# Patient Record
Sex: Female | Born: 1975 | Race: White | Hispanic: No | Marital: Married | Smoking: Current every day smoker
Health system: Southern US, Community
[De-identification: ages and names within clinical notes are randomized; demographics above are authoritative.]

## PROBLEM LIST (undated history)

## (undated) DIAGNOSIS — F32A Depression, unspecified: Secondary | ICD-10-CM

## (undated) HISTORY — DX: Depression, unspecified: F32.A

---

## 1998-06-12 HISTORY — PX: CHOLECYSTECTOMY: SHX55

## 2017-09-10 DIAGNOSIS — F418 Other specified anxiety disorders: Secondary | ICD-10-CM | POA: Insufficient documentation

## 2019-05-23 ENCOUNTER — Telehealth: Payer: Self-pay

## 2019-05-23 ENCOUNTER — Ambulatory Visit: Payer: Self-pay

## 2019-05-23 NOTE — Telephone Encounter (Signed)
Called and spoke to patients husband first and he continued to speak of his wife symptoms. I got Cristina Finley on the line.  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  She is experiecning cough, diarrhea, muscle aches, taste is off, and she has fever. She  Symptoms started last week with cough. She states she produces phelgm.  She has fever of 101.  She has had 4-5 watery stools today. She feels SOB with activity.Care advice read to patient. She verbalized understanding. She will go to [facility] evaluation of symptoms and call for establish care visit Monday She will go to [facility] tomorrow for COVID-19 testing. She verbalized understanding of all information  Reason for Disposition . [1] COVID-19 infection suspected by caller or triager AND [2] mild symptoms (cough, fever, or others) AND [7] no complications or SOB  Answer Assessment - Initial Assessment Questions 1. COVID-19 DIAGNOSIS: "Who made your Coronavirus (COVID-19) diagnosis?" "Was it confirmed by a positive lab test?" If not diagnosed by a HCP, ask "Are there lots of cases (community spread) where you live?" (See public health department website, if unsure)       2. COVID-19 EXPOSURE: "Was there any known exposure to COVID before the symptoms began?" CDC Definition of close contact: within 6 feet (2 meters) for a total of 15 minutes or more over a 24-hour period.      Last week with cough 3. ONSET: "When did the COVID-19 symptoms start?"     Last week 4. WORST SYMPTOM: "What is your worst symptom?" (e.g., cough, fever, shortness of breath, muscle aches)    Diarrhea cough fever 5. COUGH: "Do you have a cough?" If so, ask: "How bad is the cough?"      frequent 6. FEVER: "Do you have a fever?" If so, ask: "What is your temperature, how was it measured, and when did it start?"     101 7. RESPIRATORY STATUS: "Describe your breathing?" (e.g., shortness of breath, wheezing, unable to speak)     SOB  with activity 8. BETTER-SAME-WORSE: "Are you getting better, staying the same or getting worse compared to yesterday?"  If getting worse, ask, "In what way?"   same 9. HIGH RISK DISEASE: "Do you have any chronic medical problems?" (e.g., asthma, heart or lung disease, weak immune system, obesity, etc.)    smoker 10. PREGNANCY: "Is there any chance you are pregnant?" "When was your last menstrual period?"      No 2 weeks ago 11. OTHER SYMPTOMS: "Do you have any other symptoms?"  (e.g., chills, fatigue, headache, loss of smell or taste, muscle pain, sore throat; new loss of smell or taste especially support the diagnosis of COVID-19)      Tired headache,taste is off,muscle pain Sore throat diarrhea vomited once phlegm  Protocols used: CORONAVIRUS (COVID-19) DIAGNOSED OR SUSPECTED-A-AH

## 2019-05-23 NOTE — Telephone Encounter (Signed)
Open in error see triage encounter

## 2019-07-18 ENCOUNTER — Ambulatory Visit: Payer: Self-pay | Admitting: Family Medicine

## 2020-08-27 ENCOUNTER — Other Ambulatory Visit: Payer: Self-pay

## 2020-08-27 ENCOUNTER — Encounter: Payer: Self-pay | Admitting: Physician Assistant

## 2020-08-27 ENCOUNTER — Ambulatory Visit: Payer: Self-pay | Admitting: Physician Assistant

## 2020-08-27 DIAGNOSIS — Z113 Encounter for screening for infections with a predominantly sexual mode of transmission: Secondary | ICD-10-CM

## 2020-08-27 DIAGNOSIS — F418 Other specified anxiety disorders: Secondary | ICD-10-CM

## 2020-08-27 DIAGNOSIS — N760 Acute vaginitis: Secondary | ICD-10-CM

## 2020-08-27 DIAGNOSIS — B9689 Other specified bacterial agents as the cause of diseases classified elsewhere: Secondary | ICD-10-CM

## 2020-08-27 LAB — WET PREP FOR TRICH, YEAST, CLUE
Trichomonas Exam: NEGATIVE
Yeast Exam: NEGATIVE

## 2020-08-27 MED ORDER — METRONIDAZOLE 500 MG PO TABS: 500.0000 mg | ORAL_TABLET | Freq: Two times a day (BID) | ORAL | 0 refills | Status: DC

## 2020-08-27 MED ORDER — METRONIDAZOLE 500 MG PO TABS
500.0000 mg | ORAL_TABLET | Freq: Two times a day (BID) | ORAL | 0 refills | Status: AC
Start: 2020-08-27 — End: 2020-09-03

## 2020-08-27 NOTE — Progress Notes (Signed)
[facility] Department STI clinic/screening visit  Subjective:  Cristina Finley is a 45 y.o. female being seen today for an STI screening visit. The patient reports they do have symptoms.  Patient reports that they do not desire a pregnancy in the next year.   They reported they are not interested in discussing contraception today.  No LMP recorded.   Patient has the following medical conditions:   Patient Active Problem List   Diagnosis Date Noted  . Depression with anxiety 09/10/2017    Chief Complaint  Patient presents with  . STD screen    HPI  Patient reports that she has found out that her partner of 20+ years has been meeting other people online and she is concerned that he may have met someone in person so she would like a screening today.  States that for about 2 weeks she has noticed an increase in discharge with odor.  Denies other symptoms.  Reports that she has not had a HIV test in the past and last pap was in 2020.  LMP was 08/14/2020 and has had BTL for birth control.   See flowsheet for further details and programmatic requirements.    The following portions of the patient's history were reviewed and updated as appropriate: allergies, current medications, past medical history, past social history, past surgical history and problem list.  Objective:  There were no vitals filed for this visit.  Physical Exam Constitutional:      General: She is not in acute distress.    Appearance: Normal appearance.  HENT:     Head: Normocephalic and atraumatic.     Comments: No nits,lice, or hair loss. No cervical, supraclavicular or axillary adenopathy.    Mouth/Throat:     Mouth: Mucous membranes are moist.     Pharynx: Oropharynx is clear. No oropharyngeal exudate or posterior oropharyngeal erythema.  Eyes:     Conjunctiva/sclera: Conjunctivae normal.  Pulmonary:     Effort: Pulmonary effort is normal.  Abdominal:     Palpations: Abdomen is soft. There is  no mass.     Tenderness: There is no abdominal tenderness. There is no guarding or rebound.  Genitourinary:    General: Normal vulva.     Rectum: Normal.     Comments: External genitalia/pubic area without nits, lice, edema, erythema, lesions and inguinal adenopathy. Vagina with normal mucosa and moderate amount of thick, white discharge, pH=>4.5. Cervix without visible lesions. Uterus firm, mobile, nt, no masses, no CMT, no adnexal tenderness or fullness. Musculoskeletal:     Cervical back: Neck supple. No tenderness.  Skin:    General: Skin is warm and dry.     Findings: No bruising, erythema, lesion or rash.  Neurological:     Mental Status: She is alert and oriented to person, place, and time.  Psychiatric:        Mood and Affect: Mood normal.        Behavior: Behavior normal.        Thought Content: Thought content normal.        Judgment: Judgment normal.      Assessment and Plan:  Cristina Finley is a 45 y.o. female presenting to the [facility] Department for STI screening  1. Screening for venereal disease Patient into clinic with symptoms. Rec condoms with all sex. Await test results.  Counseled that RN will call if needs to RTC for treatment once results are back. - WET PREP FOR [place] [place]  [facility] - HIV/HCV [facility] - Syphilis Serology, [facility]  2. BV (bacterial vaginosis) Will treat with Metronidazole 500 mg #14 1 po BID for 7 days with food, no EtOH for 24 hr before and until 72 hr after completing medicine. No sex for 10 days. Enc to use OTC antifungal cream if has itching during or just after treatment with antibiotic. - metroNIDAZOLE (FLAGYL) 500 MG tablet; Take 1 tablet (500 mg total) by mouth 2 (two) times daily for 7 days.  Dispense: 14 tablet; Refill: 0  3. Depression with anxiety Patient with h/o anxiety and depression. Declines referral to LCSW today but does accept card to use if changes her  mind. Patient also given info on [employer] and [facility].     No follow-ups on file.  No future appointments.  Jerene Dilling, PA

## 2020-08-27 NOTE — Progress Notes (Signed)
Wet mount reviewed.  MTZ given per C. [place] PA VO Richmond Campbell, RN

## 2020-08-31 ENCOUNTER — Encounter: Payer: Self-pay | Admitting: Physician Assistant

## 2020-08-31 ENCOUNTER — Telehealth: Payer: Self-pay

## 2020-08-31 NOTE — Progress Notes (Signed)
Per Resa Miner, RN the [facility] called about patient's samples but the message she got was not specific as to what needs to be resent.  RN to call [facility] and find out what they need.  Per note left on message, patient needs to have HIV and Syphilis resent.  Per [employer], patient's blood hemolyzed and was sent, but likely that is why the [facility] is not able to complete the testing.  Patient will be called and rescheduled to have her blood redrawn for the testing.

## 2020-09-01 ENCOUNTER — Other Ambulatory Visit: Payer: BC Managed Care – PPO

## 2020-09-01 ENCOUNTER — Other Ambulatory Visit: Payer: Self-pay

## 2020-09-01 DIAGNOSIS — Z113 Encounter for screening for infections with a predominantly sexual mode of transmission: Secondary | ICD-10-CM

## 2020-09-01 NOTE — Progress Notes (Signed)
Patient in lab today to have HIV/HCV and RPR redrawn; sample from last week hemolyzed.Richmond Campbell, RN

## 2020-09-07 ENCOUNTER — Encounter: Payer: Self-pay | Admitting: Physician Assistant

## 2020-09-07 LAB — HM HEPATITIS C SCREENING LAB: HM Hepatitis Screen: NEGATIVE

## 2020-09-08 NOTE — Telephone Encounter (Signed)
Patient scheduled for re-draw of HIV/HCV/RPR Cristina Campbell, RN

## 2020-09-09 ENCOUNTER — Other Ambulatory Visit: Payer: Self-pay | Admitting: Obstetrics and Gynecology

## 2020-09-09 DIAGNOSIS — Z1231 Encounter for screening mammogram for malignant neoplasm of breast: Secondary | ICD-10-CM

## 2020-09-09 LAB — HM PAP SMEAR

## 2020-10-18 ENCOUNTER — Emergency Department (EMERGENCY_DEPARTMENT_HOSPITAL)
Admission: EM | Admit: 2020-10-18 | Discharge: 2020-10-19 | Disposition: A | Discharge status: Other Acute Inpatient Hospital | Payer: BC Managed Care – PPO | Source: Home / Self Care | Attending: Emergency Medicine | Admitting: Emergency Medicine

## 2020-10-18 ENCOUNTER — Encounter: Payer: Self-pay | Admitting: *Deleted

## 2020-10-18 ENCOUNTER — Other Ambulatory Visit: Payer: Self-pay

## 2020-10-18 DIAGNOSIS — Z720 Tobacco use: Secondary | ICD-10-CM

## 2020-10-18 DIAGNOSIS — F333 Major depressive disorder, recurrent, severe with psychotic symptoms: Secondary | ICD-10-CM | POA: Insufficient documentation

## 2020-10-18 DIAGNOSIS — R45851 Suicidal ideations: Secondary | ICD-10-CM | POA: Insufficient documentation

## 2020-10-18 DIAGNOSIS — R440 Auditory hallucinations: Secondary | ICD-10-CM | POA: Insufficient documentation

## 2020-10-18 DIAGNOSIS — F1010 Alcohol abuse, uncomplicated: Secondary | ICD-10-CM

## 2020-10-18 DIAGNOSIS — F323 Major depressive disorder, single episode, severe with psychotic features: Secondary | ICD-10-CM

## 2020-10-18 DIAGNOSIS — F17210 Nicotine dependence, cigarettes, uncomplicated: Secondary | ICD-10-CM | POA: Insufficient documentation

## 2020-10-18 DIAGNOSIS — F332 Major depressive disorder, recurrent severe without psychotic features: Secondary | ICD-10-CM

## 2020-10-18 DIAGNOSIS — Z20822 Contact with and (suspected) exposure to covid-19: Secondary | ICD-10-CM | POA: Insufficient documentation

## 2020-10-18 LAB — CBC
HCT: 38.6 % (ref 36.0–46.0)
Hemoglobin: 12.8 g/dL (ref 12.0–15.0)
MCH: 30.5 pg (ref 26.0–34.0)
MCHC: 33.2 g/dL (ref 30.0–36.0)
MCV: 91.9 fL (ref 80.0–100.0)
Platelets: 286 10*3/uL (ref 150–400)
RBC: 4.2 MIL/uL (ref 3.87–5.11)
RDW: 14 % (ref 11.5–15.5)
WBC: 10.1 10*3/uL (ref 4.0–10.5)
nRBC: 0 % (ref 0.0–0.2)

## 2020-10-18 LAB — URINE DRUG SCREEN, QUALITATIVE (ARMC ONLY)
Amphetamines, Ur Screen: NOT DETECTED
Barbiturates, Ur Screen: NOT DETECTED
Benzodiazepine, Ur Scrn: NOT DETECTED
Cannabinoid 50 Ng, Ur ~~LOC~~: NOT DETECTED
Cocaine Metabolite,Ur ~~LOC~~: NOT DETECTED
MDMA (Ecstasy)Ur Screen: NOT DETECTED
Methadone Scn, Ur: NOT DETECTED
Opiate, Ur Screen: NOT DETECTED
Phencyclidine (PCP) Ur S: NOT DETECTED
Tricyclic, Ur Screen: NOT DETECTED

## 2020-10-18 LAB — COMPREHENSIVE METABOLIC PANEL
ALT: 14 U/L (ref 0–44)
AST: 16 U/L (ref 15–41)
Albumin: 4.1 g/dL (ref 3.5–5.0)
Alkaline Phosphatase: 53 U/L (ref 38–126)
Anion gap: 11 (ref 5–15)
BUN: 9 mg/dL (ref 6–20)
CO2: 24 mmol/L (ref 22–32)
Calcium: 9.2 mg/dL (ref 8.9–10.3)
Chloride: 102 mmol/L (ref 98–111)
Creatinine, Ser: 0.75 mg/dL (ref 0.44–1.00)
GFR, Estimated: >60 mL/min (ref >60)
Glucose, Bld: 97 mg/dL (ref 70–99)
Potassium: 3.6 mmol/L (ref 3.5–5.1)
Sodium: 137 mmol/L (ref 135–145)
Total Bilirubin: 0.5 mg/dL (ref 0.3–1.2)
Total Protein: 7.4 g/dL (ref 6.5–8.1)

## 2020-10-18 LAB — RESP PANEL BY RT-PCR (FLU A&B, COVID) ARPGX2
Influenza A by PCR: NEGATIVE
Influenza B by PCR: NEGATIVE
SARS Coronavirus 2 by RT PCR: NEGATIVE

## 2020-10-18 LAB — SALICYLATE LEVEL: Salicylate Lvl: <7 mg/dL — ABNORMAL LOW (ref 7.0–30.0)

## 2020-10-18 LAB — PREGNANCY, URINE: Preg Test, Ur: NEGATIVE

## 2020-10-18 LAB — ACETAMINOPHEN LEVEL: Acetaminophen (Tylenol), Serum: <10 ug/mL — ABNORMAL LOW (ref 10–30)

## 2020-10-18 LAB — ETHANOL: Alcohol, Ethyl (B): <10 mg/dL (ref <10)

## 2020-10-18 MED ORDER — LORAZEPAM 2 MG PO TABS
2.0000 mg | ORAL_TABLET | Freq: Once | ORAL | Status: AC
  Administered 2020-10-18: 2 mg via ORAL
  Filled 2020-10-18: qty 1

## 2020-10-18 NOTE — ED Notes (Signed)
Pt continues to be timid and quiet. Pt when talking to nurse states that she needs help with her suicidal thoughts and her other issues but then does not say anything to nurse again about this despite attempting to have conversation with pt.

## 2020-10-18 NOTE — ED Notes (Signed)
This nurse communicated with Melody, Secretary who calls in to request SOC consults that per Dr. Stafford, due to 12-24 hour turnaround time for consult completion, no SOC consult will be ordered and we will wait for psych in AM. 

## 2020-10-18 NOTE — ED Notes (Signed)
Hourly rounding performed, patient currently awake in room. Patient has no complaints at this time. Q15 minute rounds and monitoring via Security Cameras to continue. 

## 2020-10-18 NOTE — ED Notes (Signed)
Black leggings White bra Sandals Gray shirt 3 bobby pins 2 yellow color rings

## 2020-10-18 NOTE — ED Notes (Signed)
tts states pt found in room crying and anxious before exam. This nurse attempts so call Dr. Scotty Court, no answer. Secure chat sent.

## 2020-10-18 NOTE — ED Notes (Signed)
Hourly rounding performed, patient currently asleep in room. Patient has no complaints at this time. Q15 minute rounds and monitoring via Security Cameras to continue. 

## 2020-10-18 NOTE — ED Triage Notes (Addendum)
Pt reports feeling SI.  Denies HI. Hx of drugs and etoh.   Pt reports no reason to live, husband left for another woman per pt.  Pt calm and cooperative.

## 2020-10-18 NOTE — ED Notes (Signed)
Patient transferred from ED to [facility] room 5 after screening for contraband. Report received from [place], RN including Situation, Background, Assessment and Recommendations. Pt oriented to unit including Q15 minute rounds as well as the security cameras for their protection. Patient is alert and oriented, warm and dry in no acute distress. Patient denies HI and AVH. Pt. Encouraged to let this nurse know if needs arise.

## 2020-10-19 ENCOUNTER — Other Ambulatory Visit: Payer: Self-pay

## 2020-10-19 ENCOUNTER — Inpatient Hospital Stay
Admission: RE | Admit: 2020-10-19 | Discharge: 2020-11-15 | DRG: 885 | Disposition: A | Discharge status: Home / Self Care | Payer: BC Managed Care – PPO | Source: Intra-hospital | Attending: Behavioral Health | Admitting: Behavioral Health

## 2020-10-19 ENCOUNTER — Encounter: Payer: Self-pay | Admitting: Psychiatry

## 2020-10-19 DIAGNOSIS — Z01812 Encounter for preprocedural laboratory examination: Secondary | ICD-10-CM | POA: Diagnosis present

## 2020-10-19 DIAGNOSIS — F17210 Nicotine dependence, cigarettes, uncomplicated: Secondary | ICD-10-CM | POA: Diagnosis present

## 2020-10-19 DIAGNOSIS — Z880 Allergy status to penicillin: Secondary | ICD-10-CM | POA: Diagnosis not present

## 2020-10-19 DIAGNOSIS — F1010 Alcohol abuse, uncomplicated: Secondary | ICD-10-CM | POA: Diagnosis present

## 2020-10-19 DIAGNOSIS — Z720 Tobacco use: Secondary | ICD-10-CM | POA: Diagnosis present

## 2020-10-19 DIAGNOSIS — F332 Major depressive disorder, recurrent severe without psychotic features: Secondary | ICD-10-CM | POA: Diagnosis present

## 2020-10-19 DIAGNOSIS — Z818 Family history of other mental and behavioral disorders: Secondary | ICD-10-CM

## 2020-10-19 DIAGNOSIS — G4700 Insomnia, unspecified: Secondary | ICD-10-CM | POA: Diagnosis present

## 2020-10-19 DIAGNOSIS — Z20822 Contact with and (suspected) exposure to covid-19: Secondary | ICD-10-CM | POA: Diagnosis present

## 2020-10-19 DIAGNOSIS — R636 Underweight: Secondary | ICD-10-CM | POA: Diagnosis present

## 2020-10-19 DIAGNOSIS — F323 Major depressive disorder, single episode, severe with psychotic features: Secondary | ICD-10-CM | POA: Diagnosis present

## 2020-10-19 DIAGNOSIS — F1290 Cannabis use, unspecified, uncomplicated: Secondary | ICD-10-CM | POA: Diagnosis not present

## 2020-10-19 DIAGNOSIS — R569 Unspecified convulsions: Secondary | ICD-10-CM | POA: Diagnosis not present

## 2020-10-19 DIAGNOSIS — F333 Major depressive disorder, recurrent, severe with psychotic symptoms: Secondary | ICD-10-CM | POA: Diagnosis present

## 2020-10-19 DIAGNOSIS — Z5181 Encounter for therapeutic drug level monitoring: Secondary | ICD-10-CM | POA: Diagnosis not present

## 2020-10-19 DIAGNOSIS — R45851 Suicidal ideations: Secondary | ICD-10-CM | POA: Diagnosis present

## 2020-10-19 MED ORDER — TRAZODONE HCL 50 MG PO TABS
50.0000 mg | ORAL_TABLET | Freq: Every evening | ORAL | Status: DC | PRN
  Administered 2020-10-19 – 2020-11-14 (×11): 50 mg via ORAL
  Filled 2020-10-19 (×11): qty 1

## 2020-10-19 MED ORDER — ARIPIPRAZOLE 5 MG PO TABS
5.0000 mg | ORAL_TABLET | Freq: Every day | ORAL | Status: DC
  Administered 2020-10-19 – 2020-10-21 (×3): 5 mg via ORAL
  Filled 2020-10-19 (×3): qty 1

## 2020-10-19 MED ORDER — MAGNESIUM HYDROXIDE 400 MG/5ML PO SUSP: 30.0000 mL | Freq: Every day | ORAL | Status: DC | PRN

## 2020-10-19 MED ORDER — HYDROXYZINE HCL 25 MG PO TABS
25.0000 mg | ORAL_TABLET | Freq: Three times a day (TID) | ORAL | Status: DC | PRN
  Administered 2020-10-19 – 2020-11-14 (×6): 25 mg via ORAL
  Filled 2020-10-19 (×6): qty 1

## 2020-10-19 MED ORDER — ALUM & MAG HYDROXIDE-SIMETH 200-200-20 MG/5ML PO SUSP: 30.0000 mL | ORAL | Status: DC | PRN

## 2020-10-19 MED ORDER — NICOTINE 21 MG/24HR TD PT24
21.0000 mg | MEDICATED_PATCH | Freq: Every day | TRANSDERMAL | Status: DC
  Filled 2020-10-19: qty 1

## 2020-10-19 MED ORDER — CITALOPRAM HYDROBROMIDE 20 MG PO TABS
20.0000 mg | ORAL_TABLET | Freq: Every day | ORAL | Status: DC
  Administered 2020-10-19 – 2020-11-01 (×13): 20 mg via ORAL
  Filled 2020-10-19 (×14): qty 1

## 2020-10-19 MED ORDER — PNEUMOCOCCAL VAC POLYVALENT 25 MCG/0.5ML IJ INJ
0.5000 mL | INJECTION | INTRAMUSCULAR | Status: DC
  Filled 2020-10-19: qty 0.5

## 2020-10-19 MED ORDER — NICOTINE 21 MG/24HR TD PT24
21.0000 mg | MEDICATED_PATCH | Freq: Every day | TRANSDERMAL | Status: DC
  Administered 2020-10-20 – 2020-11-14 (×15): 21 mg via TRANSDERMAL
  Filled 2020-10-19 (×21): qty 1

## 2020-10-19 MED ORDER — ACETAMINOPHEN 325 MG PO TABS: 650.0000 mg | ORAL_TABLET | Freq: Four times a day (QID) | ORAL | Status: DC | PRN

## 2020-10-19 NOTE — ED Notes (Signed)
Hourly rounding performed, patient currently asleep in room. Patient has no complaints at this time. Q15 minute rounds and monitoring via Security Cameras to continue. 

## 2020-10-19 NOTE — ED Notes (Signed)
Pt asleep at this time, unable to collect vitals. Will collect pt vitals once awake. 

## 2020-10-19 NOTE — ED Notes (Signed)
IVC/Psych Consult Pending
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
## 2020-10-19 NOTE — Consult Note (Signed)
[facility] Face-to-Face Psychiatry Consult   Reason for Consult: Consult for 45 year old woman who came to the emergency room voluntarily but now was placed under IVC for depression Referring Physician:  Vicente Males Patient Identification: Cristina Finley MRN:  [id number] Principal Diagnosis: Severe recurrent major depression without psychotic features (HCC) Diagnosis:  Principal Problem:   Severe recurrent major depression without psychotic features (HCC) Active Problems:   Alcohol abuse   Nicotine abuse   Total Time spent with patient: 1 hour  Subjective:   Cristina Finley is a 45 y.o. female patient admitted with "I have been so depressed".  HPI: Patient seen chart reviewed.  45 year old woman came to the emergency room voluntarily last night but later was placed under IVC because of concern about suicidal ideation.  Patient reports that she is feeling extremely depressed.  She has felt terrible for at least a month and a half.  Says that she gets confused and feels like she is losing time.  Seems to be dissociating at time because she is so emotionally overwhelmed.  Not doing much.  No longer working.  Sleeps poorly at night.  Energy poor.  Not eating well.  Patient says that she will sometimes have visual hallucinations that flitter through her eyesight.  Recently she has been having suicidal thoughts.  The last week she took an overdose of medication but never came for any treatment of it.  Continues to wish that she were dead and feel constantly negative.  Patient is not currently receiving any mental health treatment.  She admits that she has been drinking more heavily for the last couple months.  Usually drinks at least a 6 pack of beer a day sometimes drinking twice that much.  Last drink about 3 days ago.  No history of seizures or DTs.  Says that she has been so out of it she does not even know if she has used other drugs but her drug screen is otherwise negative.  Major life stress she and her  husband are breaking up.  The specific details of it seems somewhat unclear.  Notes from the husband suggest that he may have had some kind of affair and then she had an affair as well.  Time course of all of it is not certain.  She says that she and her husband have been living together still "on and off".  Past Psychiatric History: Patient has been prescribed antidepressant medication in the past.  Most recently was on citalopram.  Recalls Lexapro from the past as well.  Denies previous hospitalizations.  As mentioned reports a suicide attempt this past week and says that she thinks she has done it 1 other time in her life.  Does not describe any history of mania.  No violence to others.  Alcohol abuse seems to have been a chronic issue that is gotten worse recently.  Does not seem to have ever been involved in any substance abuse treatment.  Risk to Self:   Risk to Others:   Prior Inpatient Therapy:   Prior Outpatient Therapy:    Past Medical History: No past medical history on file.  Family History: No family history on file. Family Psychiatric  History: Denies any significant family history except for 1 aunt who has schizophrenia Social History:  Social History   Substance and Sexual Activity  Alcohol Use Yes   Comment: 1-2 drinks a week     Social History   Substance and Sexual Activity  Drug Use Not Currently  . Types:  Marijuana   Comment: last use was 20+ years ago    Social History   Socioeconomic History  . Marital status: Married    Spouse name: Not on file  . Number of children: Not on file  . Years of education: Not on file  . Highest education level: Not on file  Occupational History  . Not on file  Tobacco Use  . Smoking status: Current Every Day Smoker    Types: Cigarettes  . Smokeless tobacco: Never Used  Substance and Sexual Activity  . Alcohol use: Yes    Comment: 1-2 drinks a week  . Drug use: Not Currently    Types: Marijuana    Comment: last use was  20+ years ago  . Sexual activity: Yes    Partners: Male    Birth control/protection: Surgical  Other Topics Concern  . Not on file  Social History Narrative  . Not on file   Social Determinants of Health   Financial Resource Strain: Not on file  Food Insecurity: Not on file  Transportation Needs: Not on file  Physical Activity: Not on file  Stress: Not on file  Social Connections: Not on file   Additional Social History:    Allergies:   Allergies  Allergen Reactions  . Penicillins Hives and Nausea Only    Labs:  Results for orders placed or performed during the hospital encounter of 10/18/20 (from the past 48 hour(s))  Urine Drug Screen, Qualitative     Status: None   Collection Time: 10/18/20  6:49 PM  Result Value Ref Range   Tricyclic, Ur Screen NONE DETECTED NONE DETECTED   Amphetamines, Ur Screen NONE DETECTED NONE DETECTED   MDMA (Ecstasy)Ur Screen NONE DETECTED NONE DETECTED   Cocaine Metabolite,Ur [place] NONE DETECTED NONE DETECTED   Opiate, Ur Screen NONE DETECTED NONE DETECTED   Phencyclidine (PCP) Ur S NONE DETECTED NONE DETECTED   Cannabinoid 50 Ng, Ur [place] NONE DETECTED NONE DETECTED   Barbiturates, Ur Screen NONE DETECTED NONE DETECTED   Benzodiazepine, Ur Scrn NONE DETECTED NONE DETECTED   Methadone Scn, Ur NONE DETECTED NONE DETECTED    Comment: (NOTE) Tricyclics + metabolites, urine    Cutoff 1000 ng/mL Amphetamines + metabolites, urine  Cutoff 1000 ng/mL MDMA (Ecstasy), urine              Cutoff 500 ng/mL Cocaine Metabolite, urine          Cutoff 300 ng/mL Opiate + metabolites, urine        Cutoff 300 ng/mL Phencyclidine (PCP), urine         Cutoff 25 ng/mL Cannabinoid, urine                 Cutoff 50 ng/mL Barbiturates + metabolites, urine  Cutoff 200 ng/mL Benzodiazepine, urine              Cutoff 200 ng/mL Methadone, urine                   Cutoff 300 ng/mL  The urine drug screen provides only a preliminary, unconfirmed analytical test result and  should not be used for non-medical purposes. Clinical consideration and professional judgment should be applied to any positive drug screen result due to possible interfering substances. A more specific alternate chemical method must be used in order to obtain a confirmed analytical result. Gas chromatography / mass spectrometry (GC/MS) is the preferred confirm atory method. Performed at [facility], [address] [place], [place]  [address]   Pregnancy, urine     Status: None   Collection Time: 10/18/20  6:49 PM  Result Value Ref Range   Preg Test, Ur NEGATIVE NEGATIVE    Comment: Performed at [facility], [address] [place], [place] [address]  Comprehensive metabolic panel     Status: None   Collection Time: 10/18/20  6:50 PM  Result Value Ref Range   Sodium 137 135 - 145 mmol/L   Potassium 3.6 3.5 - 5.1 mmol/L   Chloride 102 98 - 111 mmol/L   CO2 24 22 - 32 mmol/L   Glucose, Bld 97 70 - 99 mg/dL    Comment: Glucose reference range applies only to samples taken after fasting for at least 8 hours.   BUN 9 6 - 20 mg/dL   Creatinine, Ser 0.17 0.44 - 1.00 mg/dL   Calcium 9.2 8.9 - 79.3 mg/dL   Total Protein 7.4 6.5 - 8.1 g/dL   Albumin 4.1 3.5 - 5.0 g/dL   AST 16 15 - 41 U/L   ALT 14 0 - 44 U/L   Alkaline Phosphatase 53 38 - 126 U/L   Total Bilirubin 0.5 0.3 - 1.2 mg/dL   GFR, Estimated >90 >30 mL/min    Comment: (NOTE) Calculated using the CKD-EPI Creatinine Equation (2021)    Anion gap 11 5 - 15    Comment: Performed at [facility], [address] [place], [place] [address]  Ethanol     Status: None   Collection Time: 10/18/20  6:50 PM  Result Value Ref Range   Alcohol, Ethyl (B) <10 <10 mg/dL    Comment: (NOTE) Lowest detectable limit for serum alcohol is 10 mg/dL.  For medical purposes only. Performed at [facility], [address] [place], [place] [address]   Salicylate level     Status: Abnormal   Collection  Time: 10/18/20  6:50 PM  Result Value Ref Range   Salicylate Lvl <7.0 (L) 7.0 - 30.0 mg/dL    Comment: Performed at [facility], [address] [place], [place] [address]  Acetaminophen level     Status: Abnormal   Collection Time: 10/18/20  6:50 PM  Result Value Ref Range   Acetaminophen (Tylenol), Serum <10 (L) 10 - 30 ug/mL    Comment: (NOTE) Therapeutic concentrations vary significantly. A range of 10-30 ug/mL  may be an effective concentration for many patients. However, some  are best treated at concentrations outside of this range. Acetaminophen concentrations >150 ug/mL at 4 hours after ingestion  and >50 ug/mL at 12 hours after ingestion are often associated with  toxic reactions.  Performed at [facility], [address] [place], [place] [address]   cbc     Status: None   Collection Time: 10/18/20  6:50 PM  Result Value Ref Range   WBC 10.1 4.0 - 10.5 K/uL   RBC 4.20 3.87 - 5.11 MIL/uL   Hemoglobin 12.8 12.0 - 15.0 g/dL   HCT 56.3 89.3 - 73.4 %   MCV 91.9 80.0 - 100.0 fL   MCH 30.5 26.0 - 34.0 pg   MCHC 33.2 30.0 - 36.0 g/dL   RDW 28.7 68.1 - 15.7 %   Platelets 286 150 - 400 K/uL   nRBC 0.0 0.0 - 0.2 %    Comment: Performed at [facility], [address] [place], [place] [address]  Resp Panel by RT-PCR (Flu A&B, Covid) Nasopharyngeal Swab     Status: None  Collection Time: 10/18/20  7:49 PM   Specimen: Nasopharyngeal Swab; Nasopharyngeal(NP) swabs in vial transport medium  Result Value Ref Range   SARS Coronavirus 2 by RT PCR NEGATIVE NEGATIVE    Comment: (NOTE) SARS-CoV-2 target nucleic acids are NOT DETECTED.  The SARS-CoV-2 RNA is generally detectable in upper respiratory specimens during the acute phase of infection. The lowest concentration of SARS-CoV-2 viral copies this assay can detect is 138 copies/mL. A negative result does not preclude SARS-Cov-2 infection and should not be used as the sole basis for treatment  or other patient management decisions. A negative result may occur with  improper specimen collection/handling, submission of specimen other than nasopharyngeal swab, presence of viral mutation(s) within the areas targeted by this assay, and inadequate number of viral copies(<138 copies/mL). A negative result must be combined with clinical observations, patient history, and epidemiological information. The expected result is Negative.  Fact Sheet for Patients:  BloggerCourse.com  Fact Sheet for Healthcare Providers:  SeriousBroker.it  This test is no t yet approved or cleared by the [place] FDA and  has been authorized for detection and/or diagnosis of SARS-CoV-2 by FDA under an Emergency Use Authorization (EUA). This EUA will remain  in effect (meaning this test can be used) for the duration of the COVID-19 declaration under Section 564(b)(1) of the Act, 21 U.S.C.section 360bbb-3(b)(1), unless the authorization is terminated  or revoked sooner.       Influenza A by PCR NEGATIVE NEGATIVE   Influenza B by PCR NEGATIVE NEGATIVE    Comment: (NOTE) The Xpert Xpress SARS-CoV-2/FLU/RSV plus assay is intended as an aid in the diagnosis of influenza from Nasopharyngeal swab specimens and should not be used as a sole basis for treatment. Nasal washings and aspirates are unacceptable for Xpert Xpress SARS-CoV-2/FLU/RSV testing.  Fact Sheet for Patients: BloggerCourse.com  Fact Sheet for Healthcare Providers: SeriousBroker.it  This test is not yet approved or cleared by the [place] FDA and has been authorized for detection and/or diagnosis of SARS-CoV-2 by FDA under an Emergency Use Authorization (EUA). This EUA will remain in effect (meaning this test can be used) for the duration of the COVID-19 declaration under Section 564(b)(1) of the Act, 21 U.S.C. section  360bbb-3(b)(1), unless the authorization is terminated or revoked.  Performed at [facility], [address] [place], [place] [address]     Current Facility-Administered Medications  Medication Dose Route Frequency Provider Last Rate Last Admin  . nicotine (NICODERM CQ - dosed in mg/24 hours) patch 21 mg  21 mg Transdermal Daily Ceclia Koker, [name], MD       Current Outpatient Medications  Medication Sig Dispense Refill  . citalopram (CELEXA) 20 MG tablet Take 20 mg by mouth daily.      Musculoskeletal: Strength & Muscle Tone: within normal limits Gait & Station: normal Patient leans: N/A            Psychiatric Specialty Exam:  Presentation  General Appearance: No data recorded Eye Contact:No data recorded Speech:No data recorded Speech Volume:No data recorded Handedness:No data recorded  Mood and Affect  Mood:No data recorded Affect:No data recorded  Thought Process  Thought Processes:No data recorded Descriptions of Associations:No data recorded Orientation:No data recorded Thought Content:No data recorded History of Schizophrenia/Schizoaffective disorder:No  Duration of Psychotic Symptoms:Less than six months  Hallucinations:No data recorded Ideas of Reference:No data recorded Suicidal Thoughts:No data recorded Homicidal Thoughts:No data recorded  Sensorium  Memory:No data recorded Judgment:No data recorded Insight:No data recorded  Executive Functions  Concentration:No data recorded Attention Span:No data recorded Recall:No data recorded Fund of Knowledge:No data recorded Language:No data recorded  Psychomotor Activity  Psychomotor Activity:No data recorded  Assets  Assets:No data recorded  Sleep  Sleep:No data recorded  Physical Exam: Physical Exam Vitals and nursing note reviewed.  Constitutional:      Appearance: Normal appearance.  HENT:     Head: Normocephalic and atraumatic.     Mouth/Throat:     Pharynx:  Oropharynx is clear.  Eyes:     Pupils: Pupils are equal, round, and reactive to light.  Cardiovascular:     Rate and Rhythm: Normal rate and regular rhythm.  Pulmonary:     Effort: Pulmonary effort is normal.     Breath sounds: Normal breath sounds.  Abdominal:     General: Abdomen is flat.     Palpations: Abdomen is soft.  Musculoskeletal:        General: Normal range of motion.  Skin:    General: Skin is warm and dry.  Neurological:     General: No focal deficit present.     Mental Status: She is alert. Mental status is at baseline.  Psychiatric:        Attention and Perception: She is inattentive.        Mood and Affect: Mood is depressed. Affect is tearful.        Speech: Speech is delayed.        Behavior: Behavior is slowed.        Thought Content: Thought content includes suicidal ideation. Thought content includes suicidal plan.        Cognition and Memory: Memory is impaired.        Judgment: Judgment is impulsive.    Review of Systems  Constitutional: Negative.   HENT: Negative.   Eyes: Negative.   Respiratory: Negative.   Cardiovascular: Negative.   Gastrointestinal: Negative.   Musculoskeletal: Negative.   Skin: Negative.   Neurological: Negative.   Psychiatric/Behavioral: Positive for depression, hallucinations, memory loss, substance abuse and suicidal ideas. The patient is nervous/anxious and has insomnia.    Blood pressure (!) 109/51, pulse 84, temperature 98.2 F (36.8 C), temperature source Oral, resp. rate 17, height 5\' 4"  (1.626 m), weight 47.6 kg, last menstrual period 09/22/2020, SpO2 97 %. Body mass index is 18.02 kg/m.  Treatment Plan Summary: Daily contact with patient to assess and evaluate symptoms and progress in treatment, Medication management and Plan Patient endorses almost all symptoms of severe major depression.  Vague visual hallucinations but otherwise not clearly psychotic although given her rambling history and still possible there  could be psychotic symptoms I have not discovered.  Alcohol abuse also obviously contributing.  Patient is tearful and distraught.  Looks like she is in poor health and has been losing weight but labs and vitals are unremarkable and she is steady on her feet.  Does not appear to be having DTs or acute alcohol withdrawal.  Meets criteria for admission to the psychiatric unit for treatment of depression.  Case reviewed with team downstairs and TTS and emergency room doctor.  Admission orders will be completed.  Patient will be given a nicotine patch at her request.  Continue IVC.  Disposition: Recommend psychiatric Inpatient admission when medically cleared. Supportive therapy provided about ongoing stressors.  09/24/2020, MD 10/19/2020 12:13 PM
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
## 2020-10-19 NOTE — ED Provider Notes (Signed)
[facility] Emergency Department Provider Note  ____________________________________________  Time seen: Approximately 12:57 AM  I have reviewed the triage vital signs and the nursing notes.   HISTORY  Chief Complaint Suicidal    HPI Cristina Finley is a 45 y.o. female with a past history of depression who comes to the ED complaining of suicidal ideation, feeling hopeless.  States that she stopped taking her Celexa long time ago and instead abuses alcohol and drugs.  More recently she is having auditory hallucinations which tell her to kill her self.  A week ago she tried taking an overdose of Celexa but was unsuccessful.  She reports that now her spouse is separated from her and she does not have desire to live anymore.  Denies any self-injurious behavior today.  No visual hallucinations, no HI      No past medical history on file.   Patient Active Problem List   Diagnosis Date Noted  . Depression with anxiety 09/10/2017        Prior to Admission medications   Medication Sig Start Date End Date Taking? Authorizing Provider  citalopram (CELEXA) 20 MG tablet Take 20 mg by mouth daily. 09/09/20  Yes [provider]     Allergies Penicillins   No family history on file.  Social History Social History   Tobacco Use  . Smoking status: Current Every Day Smoker    Types: Cigarettes  . Smokeless tobacco: Never Used  Substance Use Topics  . Alcohol use: Yes    Comment: 1-2 drinks a week  . Drug use: Not Currently    Types: Marijuana    Comment: last use was 20+ years ago    Review of Systems  Constitutional:   No fever or chills.  ENT:   No sore throat. No rhinorrhea. Cardiovascular:   No chest pain or syncope. Respiratory:   No dyspnea or cough. Gastrointestinal:   Negative for abdominal pain, vomiting and diarrhea.  Musculoskeletal:   Negative for focal pain or swelling All other systems reviewed and are negative except as  documented above in ROS and HPI.  ____________________________________________   PHYSICAL EXAM:  VITAL SIGNS: ED Triage Vitals  Enc Vitals Group     BP 10/18/20 1844 (!) 145/110     Pulse Rate 10/18/20 1844 86     Resp 10/18/20 1844 20     Temp 10/18/20 1844 98.9 F (37.2 C)     Temp Source 10/18/20 1844 Oral     SpO2 10/18/20 1844 97 %     Weight 10/18/20 1845 105 lb (47.6 kg)     Height 10/18/20 1845 5\' 4"  (1.626 m)     Head Circumference --      Peak Flow --      Pain Score 10/18/20 1845 0     Pain Loc --      Pain Edu? --      Excl. in GC? --     Vital signs reviewed, nursing assessments reviewed.   Constitutional:   Alert and oriented. Non-toxic appearance. Eyes:   Conjunctivae are normal. EOMI. PERRL. ENT      Head:   Normocephalic and atraumatic.      Nose:   Wearing a mask.      Mouth/Throat:   Wearing a mask.      Neck:   No meningismus. Full ROM. Hematological/Lymphatic/Immunilogical:   No cervical lymphadenopathy. Cardiovascular:   RRR. Symmetric bilateral radial and DP pulses.  No murmurs. Cap refill less  than 2 seconds. Respiratory:   Normal respiratory effort without tachypnea/retractions. Breath sounds are clear and equal bilaterally. No wheezes/rales/rhonchi. Gastrointestinal:   Soft and nontender. Non distended. There is no CVA tenderness.  No rebound, rigidity, or guarding. Genitourinary:   deferred Musculoskeletal:   Normal range of motion in all extremities. No joint effusions.  No lower extremity tenderness.  No edema. Neurologic:   Normal speech and language.  Motor grossly intact. No acute focal neurologic deficits are appreciated.  Skin:    Skin is warm, dry and intact. No rash noted.  No petechiae, purpura, or bullae.  ____________________________________________    LABS (pertinent positives/negatives) (all labs ordered are listed, but only abnormal results are displayed) Labs Reviewed  SALICYLATE LEVEL - Abnormal; Notable for the  following components:      Result Value   Salicylate Lvl <7.0 (*)    All other components within normal limits  ACETAMINOPHEN LEVEL - Abnormal; Notable for the following components:   Acetaminophen (Tylenol), Serum <10 (*)    All other components within normal limits  RESP PANEL BY RT-PCR (FLU A&B, COVID) ARPGX2  COMPREHENSIVE METABOLIC PANEL  ETHANOL  CBC  URINE DRUG SCREEN, QUALITATIVE ([facility] ONLY)  PREGNANCY, URINE   ____________________________________________   EKG    ____________________________________________    RADIOLOGY  No results found.  ____________________________________________   PROCEDURES Procedures  ____________________________________________    CLINICAL IMPRESSION / ASSESSMENT AND PLAN / ED COURSE  Medications ordered in the ED: Medications  LORazepam (ATIVAN) tablet 2 mg (2 mg Oral Given 10/18/20 2148)    Pertinent labs & imaging results that were available during my care of the patient were reviewed by me and considered in my medical decision making (see chart for details).  Cristina Finley was evaluated in Emergency Department on 10/19/2020 for the symptoms described in the history of present illness. She was evaluated in the context of the global COVID-19 pandemic, which necessitated consideration that the patient might be at risk for infection with the SARS-CoV-2 virus that causes COVID-19. Institutional protocols and algorithms that pertain to the evaluation of patients at risk for COVID-19 are in a state of rapid change based on information released by regulatory bodies including the CDC and federal and state organizations. These policies and algorithms were followed during the patient's care in the ED.   Patient presents with auditory hallucinations and severe symptoms of depression.  Will IVC for safety.  Vital signs, physical exam, labs unremarkable.  Medically stable to proceed with psychiatric evaluation.  The patient has been placed in  psychiatric observation due to the need to provide a safe environment for the patient while obtaining psychiatric consultation and evaluation, as well as ongoing medical and medication management to treat the patient's condition.  The patient has been placed under full IVC at this time.       ____________________________________________   FINAL CLINICAL IMPRESSION(S) / ED DIAGNOSES    Final diagnoses:  Current severe episode of major depressive disorder with psychotic features, unspecified whether recurrent [facility])     ED Discharge Orders    None      Portions of this note were generated with dragon dictation software. Dictation errors may occur despite best attempts at proofreading.   Sharman Cheek, MD 10/19/20 [phone]

## 2020-10-19 NOTE — Progress Notes (Signed)
Pt is a 45 yr old female with a hx of alcohol abuse. Pt reports drinking a six pack daily, smoking 2.5 - 3 packs daily, and the occasional use of marjuana. Pt reports having SI currently without a plan, however states she feels safe while she is here. Pt also shares that she occasionally hears voices and sees shadows. Pt shares that she attempted to OD recently on her home medication however was unsuccessful. Pt shares that she has been going back and forth between living with her husband and father. Pt states that she has no support system and doesn't know who or where she will live upon discharge at this time. Pt is anxious/depress/tearful upon assessment. Pt is cooperative. Pt denies any HI upon assessment. Pt reports that she wants help, she wants to be the old Cristina Finley that she doesn't know who this person is.  Skin assessment preformed upon admission. Pt has a scratch like abrasion on the back of her right leg otherwise the skin is without injury and is intact.

## 2020-10-19 NOTE — Tx Team (Signed)
Initial Treatment Plan 10/19/2020 3:54 PM [name] [id number]    PATIENT STRESSORS: Marital or family conflict Substance abuse   PATIENT STRENGTHS: Communication skills Motivation for treatment/growth   PATIENT IDENTIFIED PROBLEMS: Ineffective coping skills  Substance Abuse                   DISCHARGE CRITERIA:  Improved stabilization in mood, thinking, and/or behavior Motivation to continue treatment in a less acute level of care  PRELIMINARY DISCHARGE PLAN: Attend 12-step recovery group Outpatient therapy Participate in family therapy  PATIENT/FAMILY INVOLVEMENT: This treatment plan has been presented to and reviewed with the patient, Cristina Finley, and/or family member.  The patient and family have been given the opportunity to ask questions and make suggestions.  Sharin Mons, RN 10/19/2020, 3:54 PM

## 2020-10-19 NOTE — ED Provider Notes (Signed)
Emergency Medicine Observation Re-evaluation Note  Cristina Finley is a 45 y.o. female, seen on rounds today.  Pt initially presented to the ED for complaints of Suicidal Currently, the patient is resting comfortably.  Physical Exam  BP (!) 145/110 (BP Location: Left Arm)   Pulse 86   Temp 98.9 F (37.2 C) (Oral)   Resp 20   Ht 5\' 4"  (1.626 m)   Wt 47.6 kg   LMP 09/22/2020 (Approximate)   SpO2 97%   BMI 18.02 kg/m  Physical Exam General: No acute distress Cardiac: Well-perfused extremities Lungs: No respiratory distress Psych: Appropriate mood and affect  ED Course / MDM  EKG:   I have reviewed the labs performed to date as well as medications administered while in observation.  Recent changes in the last 24 hours include recommendation for psychiatric placement.  Plan  Current plan is for psychiatric placement. Patient is under full IVC at this time.   09/24/2020, MD 10/19/20 [phone]

## 2020-10-19 NOTE — BH Assessment (Signed)
Collateral: This Clinical research associate spoke with pt's husband Trista, Ciocca ([phone]) who reported that he and the pt had a breakup about 2-3 weeks ago due to him having an online/non-sexual affair. Husband reported that the pt responded by having 2 affairs and increasing her ETOH abuse. Husband reported that the pt is an alcoholic and has been living with her father. Husband explained that the pt's mental status has been negatively impacted evidenced by her carrying on conversations with no one there and endorsing SI. Husband explained that the pt drinks daily and often makes up stories. Husband expressed concerns about the pt's mental status, explaining that he hopes she can get the help she needs.

## 2020-10-19 NOTE — ED Provider Notes (Signed)
-----------------------------------------   1:02 AM on 10/19/2020 -----------------------------------------  Patient seen by psychiatry who recommends inpatient hospitalization.   Irean Hong, MD 10/19/20 [phone]
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
## 2020-10-19 NOTE — BH Assessment (Signed)
Patient is to be admitted to [facility] by Dr. Neale Burly.  Attending Physician will be Dr. Toni Amend.   Patient has been assigned to room 303, by [facility] Charge Nurse, Ivonne Andrew.   Intake Paper Work has been signed and placed on patient chart.    Carlisle Beers, ER Secretary    Dr. Vicente Males, ER MD   Amy, Patient's Nurse

## 2020-10-19 NOTE — BHH Group Notes (Signed)
LCSW Group Therapy Note     10/19/2020 1:46 PM     Type of Therapy/Topic:  Group Therapy:  Feelings about Diagnosis     Participation Level:  Did Not Attend     Description of Group:   This group will allow patients to explore their thoughts and feelings about diagnoses they have received. Patients will be guided to explore their level of understanding and acceptance of these diagnoses. Facilitator will encourage patients to process their thoughts and feelings about the reactions of others to their diagnosis and will guide patients in identifying ways to discuss their diagnosis with significant others in their lives. This group will be process-oriented, with patients participating in exploration of their own experiences, giving and receiving support, and processing challenge from other group members.        Therapeutic Goals:  1.    Patient will demonstrate understanding of diagnosis as evidenced by identifying two or more symptoms of the disorder  2.    Patient will be able to express two feelings regarding the diagnosis  3.    Patient will demonstrate their ability to communicate their needs through discussion and/or role play     Summary of Patient Progress: X  Therapeutic Modalities:   Cognitive Behavioral Therapy  Brief Therapy  Feelings Identification    Chazz Philson, MSW, LCSW-A  10/19/2020 1:46 PM  

## 2020-10-19 NOTE — ED Notes (Signed)
Gave breakfast tray with juice. 

## 2020-10-20 DIAGNOSIS — F333 Major depressive disorder, recurrent, severe with psychotic symptoms: Secondary | ICD-10-CM | POA: Diagnosis not present

## 2020-10-20 LAB — LIPID PANEL
Cholesterol: 184 mg/dL (ref 0–200)
HDL: 51 mg/dL (ref >40)
LDL Cholesterol: 104 mg/dL — ABNORMAL HIGH (ref 0–99)
Total CHOL/HDL Ratio: 3.6 RATIO
Triglycerides: 143 mg/dL (ref <150)
VLDL: 29 mg/dL (ref 0–40)

## 2020-10-20 LAB — TSH: TSH: 2.721 u[IU]/mL (ref 0.350–4.500)

## 2020-10-20 LAB — HEMOGLOBIN A1C
Hgb A1c MFr Bld: 5.6 % (ref 4.8–5.6)
Mean Plasma Glucose: 114.02 mg/dL

## 2020-10-20 MED ORDER — ADULT MULTIVITAMIN W/MINERALS CH
1.0000 | ORAL_TABLET | Freq: Every day | ORAL | Status: DC
  Administered 2020-10-21 – 2020-11-15 (×24): 1 via ORAL
  Filled 2020-10-20 (×26): qty 1

## 2020-10-20 MED ORDER — ENSURE ENLIVE PO LIQD
237.0000 mL | Freq: Two times a day (BID) | ORAL | Status: DC
  Administered 2020-10-20 – 2020-11-15 (×40): 237 mL via ORAL

## 2020-10-20 NOTE — Progress Notes (Signed)
Initial Nutrition Assessment  DOCUMENTATION CODES:   Underweight  INTERVENTION:   Ensure Enlive po BID, each supplement provides 350 kcal and 20 grams of protein  MVI po daily   Pt at high refeed risk; recommend monitor potassium, magnesium and phosphorus labs daily until stable  NUTRITION DIAGNOSIS:   Inadequate oral intake related to social / environmental circumstances (etoh abuse, depression) as evidenced by per patient/family report.  GOAL:   Patient will meet greater than or equal to 90% of their needs  MONITOR:   PO intake,Supplement acceptance  REASON FOR ASSESSMENT:   Malnutrition Screening Tool    ASSESSMENT:   45 year old female with history of MDD and etoh abuse who present with worsening mood and suicidal ideations.   Pt reports poor appetite and oral intake pta. RD suspects pt with poor appetite and oral intake at baseline as pt is underweight. RD suspects poor oral intake is r/t etoh abuse and depression. There are no documented weights in chart to determine if any significant recent weight changes. RD will add supplements and MVI to help pt meet her estimated needs. Pt is at high refeed risk. Pt is at high risk for malnutrition.   Medications reviewed and include: celexa, nicotine  Labs reviewed:   Diet Order:   Diet Order            Diet regular Room service appropriate? Yes; Fluid consistency: Thin  Diet effective now                EDUCATION NEEDS:   No education needs have been identified at this time  Skin:  Skin Assessment: Reviewed RN Assessment  Last BM:  5/9  Height:   Ht Readings from Last 1 Encounters:  10/19/20 5\' 4"  (1.626 m)    Weight:   Wt Readings from Last 1 Encounters:  10/19/20 47.6 kg   BMI:  Body mass index is 18.02 kg/m.  Estimated Nutritional Needs:   Kcal:  1400-1600kcal/day  Protein:  70-80g/day  Fluid:  1.4-1.6L/day  12/19/20 MS, RD, LDN Please refer to [facility]) for RD and/or RD  on-call/weekend/after hours pager

## 2020-10-20 NOTE — Progress Notes (Signed)
Recreation Therapy Notes Date: 10/20/2020  Time: 9:30 am   Location: Craft room   Behavioral response: Appropriate  Intervention Topic: Relaxation   Discussion/Intervention:  Group content today was focused on relaxation. The group defined relaxation and identified healthy ways to relax. Individuals expressed how much time they spend relaxing. Patients expressed how much their life would be if they did not make time for themselves to relax. The group stated ways they could improve their relaxation techniques in the future.  Individuals participated in the intervention "Time to Relax" where they had a chance to experience different relaxation techniques.  Clinical Observations/Feedback: Patient came to group identified reading and fishing as things she does to relax. Individual was social with staff and peers while participating in the intervention. Chequita Mofield LRT/CTRS        Mayreli Alden 10/20/2020 11:45 AM

## 2020-10-20 NOTE — Plan of Care (Signed)
Patient rated her depression and anxiety 10/10 this morning.Patient appears sad and tearful.Verbalized suicidal thoughts.Contracts for safety. Patient states her goal is " getting me back. I am not me anymore." Patient visible in the milieu. Appropriate with staff & peers. Denies HI and AVH at this time. Appetite and energy level fair. Support and encouragement given.

## 2020-10-20 NOTE — Progress Notes (Signed)
This patient continues to endorse passive SI without a plan, she is able to contract for safety. The patient did receive medication to help with anxiety and sleep. The patient slept most of the shift. Will continue to monitor and support.

## 2020-10-20 NOTE — Progress Notes (Signed)
Recreation Therapy Notes  INPATIENT RECREATION THERAPY ASSESSMENT  Patient Details Name: Cristina Finley MRN: [id number] DOB: 08-Jul-1975 Today's Date: 10/20/2020       Information Obtained From: Patient  Able to Participate in Assessment/Interview: Yes  Patient Presentation: Responsive  Reason for Admission (Per Patient): Active Symptoms,Suicidal Ideation  Patient Stressors:    Coping Skills:   Substance Abuse,Deep Breathing  Leisure Interests (2+):  Nature - Fishing,Music - Listen,Exercise - Walking  Frequency of Recreation/Participation: Monthly  Awareness of Community Resources:  Yes  Community Resources:  Park  Current Use: Yes  If no, Barriers?:    Expressed Interest in [employer] Information:    [place] of Residence:  Film/video editor  Patient Main Form of Transportation: Set designer  Patient Strengths:  N/A  Patient Identified Areas of Improvement:  Get out of this depression  Patient Goal for Hospitalization:  To stop thoughts of wanting to hurt myself.  Current SI (including self-harm):  Yes (No plan)  Current HI:  No  Current AVH: No  Staff Intervention Plan: Group Attendance,Collaborate with Interdisciplinary Treatment Team  Consent to Intern Participation: N/A  Demontez Novack 10/20/2020, 12:38 PM

## 2020-10-20 NOTE — [facility] Group Notes (Signed)
LCSW Group Therapy Note  10/20/2020 2:40 PM  Type of Therapy/Topic:  Group Therapy:  Emotion Regulation  Participation Level:  Active   Description of Group:   The purpose of this group is to assist patients in learning to regulate negative emotions and experience positive emotions. Patients will be guided to discuss ways in which they have been vulnerable to their negative emotions. These vulnerabilities will be juxtaposed with experiences of positive emotions or situations, and patients will be challenged to use positive emotions to combat negative ones. Special emphasis will be placed on coping with negative emotions in conflict situations, and patients will process healthy conflict resolution skills.  Therapeutic Goals: 1. Patient will identify two positive emotions or experiences to reflect on in order to balance out negative emotions 2. Patient will label two or more emotions that they find the most difficult to experience 3. Patient will demonstrate positive conflict resolution skills through discussion and/or role plays  Summary of Patient Progress: Patient was present for the entirety of the group session. Patient was an active listener and participated in the topic of discussion, provided helpful advice to others, and added nuance to topic of conversation. Patient shared that she "wants to focus on her self" and "keep things simple".    Therapeutic Modalities:   Cognitive Behavioral Therapy Feelings Identification Dialectical Behavioral Therapy  Gwenevere Ghazi, MSW, [place], Bridget Hartshorn 10/20/2020 2:40 PM

## 2020-10-20 NOTE — [facility] Suicide Risk Assessment (Signed)
[facility] INPATIENT:  Family/Significant Other Suicide Prevention Education  Suicide Prevention Education:  Patient Refusal for Family/Significant Other Suicide Prevention Education: The patient Cristina Finley has refused to provide written consent for family/significant other to be provided Family/Significant Other Suicide Prevention Education during admission and/or prior to discharge.  Physician notified.  SPE completed with pt, as pt refused to consent to family contact. SPI pamphlet provided to pt and pt was encouraged to share information with support network, ask questions, and talk about any concerns relating to SPE. Pt denies access to guns/firearms and verbalized understanding of information provided. Mobile Crisis information also provided to pt.  Glenis Smoker 10/20/2020, 11:37 AM

## 2020-10-20 NOTE — Progress Notes (Signed)
Recreation Therapy Notes  INPATIENT RECREATION TR PLAN  Patient Details Name: Cristina Finley MRN: [id number] DOB: 08-Feb-1976 Today's Date: 10/20/2020  Rec Therapy Plan Is patient appropriate for Therapeutic Recreation?: Yes Treatment times per week: at least 3 Estimated Length of Stay: 5-7 days TR Treatment/Interventions: Group participation (Comment)  Discharge Criteria Pt will be discharged from therapy if:: Discharged Treatment plan/goals/alternatives discussed and agreed upon by:: Patient/family  Discharge Summary     Larron Armor 10/20/2020, 12:39 PM
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
## 2020-10-20 NOTE — Progress Notes (Signed)
[facility]  10/20/2020 2:15 PM Park PopeCarmen Finley  MRN:  [id number] CC "Can't get these thoughts out of my head."  Subjective:  45 year old female with history of depression presenting for worsening mood and suicidal ideations. No acute events overnight, medication compliant, attending to ADLs. Patient seen during treatment team and again one-on-one. She notes her goal is to get "thoughts out of my head." On clarification, she notes that she wants the suicidal thoughts out of her head. She continues to think of various ways to kill herself throughout the day, but is able to contract for safety. She continues to have auditory hallucinations, but feels the voices are more of a whisper now that she can't make out. She denies any visual hallucinations or homicidal ideations. No side effects to starting Abilify yesterday. She is having a hard time focusing on the future due to severe depression. She notes that residential substance abuse treatment may be helpful once suicidal thoughts resolve.   Principal Problem: MDD (major depressive disorder), recurrent, severe, with psychosis (HCC) Diagnosis: Principal Problem:   MDD (major depressive disorder), recurrent, severe, with psychosis (HCC) Active Problems:   Alcohol abuse   Nicotine abuse  Total Time spent with patient: 30 minutes  Past Psychiatric History: See H&P  Past Medical History: History reviewed. No pertinent past medical history. History reviewed. No pertinent surgical history. Family History: History reviewed. No pertinent family history. Family Psychiatric  History: See H&P Social History:  Social History   Substance and Sexual Activity  Alcohol Use Yes  . Alcohol/week: 6.0 standard drinks  . Types: 6 Cans of beer per week   Comment: one 6 pack daily     Social History   Substance and Sexual Activity  Drug Use Yes  . Types: Marijuana   Comment: last used 2 days ago per pt    Social History   Socioeconomic History   . Marital status: Married    Spouse name: Not on file  . Number of children: Not on file  . Years of education: Not on file  . Highest education level: Not on file  Occupational History  . Not on file  Tobacco Use  . Smoking status: Current Every Day Smoker    Packs/day: 1.50    Types: Cigarettes  . Smokeless tobacco: Never Used  Vaping Use  . Vaping Use: Never used  Substance and Sexual Activity  . Alcohol use: Yes    Alcohol/week: 6.0 standard drinks    Types: 6 Cans of beer per week    Comment: one 6 pack daily  . Drug use: Yes    Types: Marijuana    Comment: last used 2 days ago per pt  . Sexual activity: Yes    Partners: Male    Birth control/protection: Surgical  Other Topics Concern  . Not on file  Social History Narrative  . Not on file   Social Determinants of Health   Financial Resource Strain: Not on file  Food Insecurity: Not on file  Transportation Needs: Not on file  Physical Activity: Not on file  Stress: Not on file  Social Connections: Not on file   Additional Social History:                         Sleep: Fair  Appetite:  Poor  Current Medications: Current Facility-Administered Medications  Medication Dose Route Frequency Provider Last Rate Last Admin  . acetaminophen (TYLENOL) tablet 650 mg  650 mg  Oral Q6H PRN [name], [name], MD      . alum & mag hydroxide-simeth (MAALOX/MYLANTA) 200-200-20 MG/5ML suspension 30 mL  30 mL Oral Q4H PRN [name], [name], MD      . ARIPiprazole (ABILIFY) tablet 5 mg  5 mg Oral Daily [name], MD   5 mg at 10/20/20 0758  . citalopram (CELEXA) tablet 20 mg  20 mg Oral Daily [name], [name], MD   20 mg at 10/20/20 0758  . feeding supplement (ENSURE ENLIVE / ENSURE PLUS) liquid 237 mL  237 mL Oral BID BM [name], MD   237 mL at 10/20/20 1352  . hydrOXYzine (ATARAX/VISTARIL) tablet 25 mg  25 mg Oral TID PRN [name], [name], MD   25 mg at 10/20/20 1352  . magnesium hydroxide (MILK OF  MAGNESIA) suspension 30 mL  30 mL Oral Daily PRN [name], [name], MD      . Melene Muller ON 10/21/2020] multivitamin with minerals tablet 1 tablet  1 tablet Oral Daily [name], MD      . nicotine (NICODERM CQ - dosed in mg/24 hours) patch 21 mg  21 mg Transdermal Daily [name], [name], MD   21 mg at 10/20/20 0758  . pneumococcal 23 valent vaccine (PNEUMOVAX-23) injection 0.5 mL  0.5 mL Intramuscular Tomorrow-1000 [name], MD      . traZODone (DESYREL) tablet 50 mg  50 mg Oral QHS PRN [name], [name], MD   50 mg at 10/19/20 2120    Lab Results:  Results for orders placed or performed during the hospital encounter of 10/19/20 (from the past 48 hour(s))  Hemoglobin A1c     Status: None   Collection Time: 10/20/20  6:40 AM  Result Value Ref Range   Hgb A1c MFr Bld 5.6 4.8 - 5.6 %    Comment: (NOTE) Pre diabetes:          5.7%-6.4%  Diabetes:              >6.4%  Glycemic control for   <7.0% adults with diabetes    Mean Plasma Glucose 114.02 mg/dL    Comment: Performed at [facility], [address] [place], [place] [address]  Lipid panel     Status: Abnormal   Collection Time: 10/20/20  6:40 AM  Result Value Ref Range   Cholesterol 184 0 - 200 mg/dL   Triglycerides 811 <914 mg/dL   HDL 51 >78 mg/dL   Total CHOL/HDL Ratio 3.6 RATIO   VLDL 29 0 - 40 mg/dL   LDL Cholesterol 295 (H) 0 - 99 mg/dL    Comment:        Total Cholesterol/HDL:CHD Risk Coronary Heart Disease Risk Table                     Men   Women  1/2 Average Risk   3.4   3.3  Average Risk       5.0   4.4  2 X Average Risk   9.6   7.1  3 X Average Risk  23.4   11.0        Use the calculated Patient Ratio above and the CHD Risk Table to determine the patient's CHD Risk.        ATP III CLASSIFICATION (LDL):  <100     mg/dL   Optimal  621-308  mg/dL   Near or Above  Optimal  130-159  mg/dL   Borderline  681-157  mg/dL   High  >262     mg/dL   Very High Performed at [facility], [address] [place], [place] [address]   TSH     Status: None   Collection Time: 10/20/20  6:40 AM  Result Value Ref Range   TSH 2.721 0.350 - 4.500 uIU/mL    Comment: Performed by a 3rd Generation assay with a functional sensitivity of <=0.01 uIU/mL. Performed at [facility], [address] [place], [place] [address]     Blood Alcohol level:  Lab Results  Component Value Date   [facility] <10 10/18/2020    Metabolic Disorder Labs: Lab Results  Component Value Date   HGBA1C 5.6 10/20/2020   MPG 114.02 10/20/2020   No results found for: PROLACTIN Lab Results  Component Value Date   CHOL 184 10/20/2020   TRIG 143 10/20/2020   HDL 51 10/20/2020   CHOLHDL 3.6 10/20/2020   VLDL 29 10/20/2020   LDLCALC 104 (H) 10/20/2020    Physical Findings: AIMS: Facial and Oral Movements Muscles of Facial Expression: None, normal Lips and Perioral Area: None, normal Jaw: None, normal Tongue: None, normal,Extremity Movements Upper (arms, wrists, hands, fingers): None, normal Lower (legs, knees, ankles, toes): None, normal, Trunk Movements Neck, shoulders, hips: None, normal, Overall Severity Severity of abnormal movements (highest score from questions above): None, normal Incapacitation due to abnormal movements: None, normal Patient's awareness of abnormal movements (rate only patient's report): No Awareness, Dental Status Current problems with teeth and/or dentures?: No Does patient usually wear dentures?: No  CIWA:  CIWA-Ar Total: 2 COWS:  COWS Total Score: 1  Musculoskeletal: Strength & Muscle Tone: within normal limits Gait & Station: normal Patient leans: N/A  Psychiatric Specialty Exam:  Presentation  General Appearance: Casual  Eye Contact:Fair  Speech:Normal Rate  Speech Volume:Normal  Handedness:Right   Mood and Affect  Mood:Depressed; Anxious  Affect:Congruent; Tearful   Thought Process  Thought  Processes:Coherent  Descriptions of Associations:Intact  Orientation:Full (Time, Place and Person)  Thought Content:Logical  History of Schizophrenia/Schizoaffective disorder:No  Duration of Psychotic Symptoms:Less than six months  Hallucinations:Hallucinations: Auditory  Ideas of Reference:None  Suicidal Thoughts:Suicidal Thoughts: Yes, Active SI Active Intent and/or Plan: Without Means to Carry Out  Homicidal Thoughts:Homicidal Thoughts: No   Sensorium  Memory:Immediate Fair; Recent Fair; Remote Fair  Judgment:Intact  Insight:Present   Executive Functions  Concentration:Fair  Attention Span:Fair  Recall:Fair  Fund of Knowledge:Fair  Language:Fair   Psychomotor Activity  Psychomotor Activity:Psychomotor Activity: Normal   Assets  Assets:Communication Skills; Desire for Improvement; Physical Health; Resilience   Sleep  Sleep:Sleep: Fair Number of Hours of Sleep: 7.15    Physical Exam: Physical Exam ROS Blood pressure (!) 124/94, pulse 85, temperature 98 F (36.7 C), temperature source Oral, resp. rate 16, height 5\' 4"  (1.626 m), weight 47.6 kg, last menstrual period 09/22/2020, SpO2 97 %. Body mass index is 18.02 kg/m.   Treatment Plan Summary: Daily contact with patient to assess and evaluate symptoms and progress in treatment and Medication management Patient presenting with MDD, recurrent, severe with psychotic features. She is currently suicidal with plan to overdose. Continue Celexa 20 mg daily, Abilify 5 mg daily for auditory hallucinations and mood. Nicoderm patch for tobacco use disorder. Alcohol cessation counseling provided. Once mood is more stable, will also discuss adding an FDA approved medication for alcohol use disorder such as Natrexone or Acamprosate.   09/24/2020,  MD 10/20/2020, 2:15 PM

## 2020-10-20 NOTE — Progress Notes (Signed)
Sleep: 7.15

## 2020-10-20 NOTE — Tx Team (Signed)
Interdisciplinary Treatment and Diagnostic Plan Update  10/20/2020 Time of Session: 9:00AM Cristina Finley MRN: [id number]  Principal Diagnosis: MDD (major depressive disorder), recurrent, severe, with psychosis ([place])  Secondary Diagnoses: Principal Problem:   MDD (major depressive disorder), recurrent, severe, with psychosis ([place]) Active Problems:   Alcohol abuse   Nicotine abuse   Current Medications:  Current Facility-Administered Medications  Medication Dose Route Frequency Provider Last Rate Last Admin  . acetaminophen (TYLENOL) tablet 650 mg  650 mg Oral Q6H PRN [name], [name], MD      . alum & mag hydroxide-simeth (MAALOX/MYLANTA) 200-200-20 MG/5ML suspension 30 mL  30 mL Oral Q4H PRN [name], [name], MD      . ARIPiprazole (ABILIFY) tablet 5 mg  5 mg Oral Daily Salley Scarlet, MD   5 mg at 10/20/20 0758  . citalopram (CELEXA) tablet 20 mg  20 mg Oral Daily [name], Madie Reno, MD   20 mg at 10/20/20 0758  . hydrOXYzine (ATARAX/VISTARIL) tablet 25 mg  25 mg Oral TID PRN [name], Madie Reno, MD   25 mg at 10/19/20 2120  . magnesium hydroxide (MILK OF MAGNESIA) suspension 30 mL  30 mL Oral Daily PRN [name], [name], MD      . nicotine (NICODERM CQ - dosed in mg/24 hours) patch 21 mg  21 mg Transdermal Daily [name], Madie Reno, MD   21 mg at 10/20/20 0758  . pneumococcal 23 valent vaccine (PNEUMOVAX-23) injection 0.5 mL  0.5 mL Intramuscular Tomorrow-1000 Salley Scarlet, MD      . traZODone (DESYREL) tablet 50 mg  50 mg Oral QHS PRN [name], Madie Reno, MD   50 mg at 10/19/20 2120   PTA Medications: Medications Prior to Admission  Medication Sig Dispense Refill Last Dose  . citalopram (CELEXA) 20 MG tablet Take 20 mg by mouth daily.       Patient Stressors: Marital or family conflict Substance abuse  Patient Strengths: Agricultural engineer for treatment/growth  Treatment Modalities: Medication Management, Group therapy, Case management,  1 to 1 session with clinician,  Psychoeducation, Recreational therapy.   Physician Treatment Plan for Primary Diagnosis: MDD (major depressive disorder), recurrent, severe, with psychosis ([place]) Long Term Goal(s): Improvement in symptoms so as ready for discharge Improvement in symptoms so as ready for discharge   Short Term Goals: Ability to identify changes in lifestyle to reduce recurrence of condition will improve Ability to verbalize feelings will improve Ability to disclose and discuss suicidal ideas Ability to demonstrate self-control will improve Ability to identify and develop effective coping behaviors will improve Ability to maintain clinical measurements within normal limits will improve Compliance with prescribed medications will improve Ability to identify triggers associated with substance abuse/mental health issues will improve Ability to identify changes in lifestyle to reduce recurrence of condition will improve Ability to verbalize feelings will improve Ability to disclose and discuss suicidal ideas Ability to demonstrate self-control will improve Ability to identify and develop effective coping behaviors will improve Ability to maintain clinical measurements within normal limits will improve Compliance with prescribed medications will improve Ability to identify triggers associated with substance abuse/mental health issues will improve  Medication Management: Evaluate patient's response, side effects, and tolerance of medication regimen.  Therapeutic Interventions: 1 to 1 sessions, Unit Group sessions and Medication administration.  Evaluation of Outcomes: Not Met  Physician Treatment Plan for Secondary Diagnosis: Principal Problem:   MDD (major depressive disorder), recurrent, severe, with psychosis ([place]) Active Problems:   Alcohol abuse  Nicotine abuse  Long Term Goal(s): Improvement in symptoms so as ready for discharge Improvement in symptoms so as ready for discharge   Short Term  Goals: Ability to identify changes in lifestyle to reduce recurrence of condition will improve Ability to verbalize feelings will improve Ability to disclose and discuss suicidal ideas Ability to demonstrate self-control will improve Ability to identify and develop effective coping behaviors will improve Ability to maintain clinical measurements within normal limits will improve Compliance with prescribed medications will improve Ability to identify triggers associated with substance abuse/mental health issues will improve Ability to identify changes in lifestyle to reduce recurrence of condition will improve Ability to verbalize feelings will improve Ability to disclose and discuss suicidal ideas Ability to demonstrate self-control will improve Ability to identify and develop effective coping behaviors will improve Ability to maintain clinical measurements within normal limits will improve Compliance with prescribed medications will improve Ability to identify triggers associated with substance abuse/mental health issues will improve     Medication Management: Evaluate patient's response, side effects, and tolerance of medication regimen.  Therapeutic Interventions: 1 to 1 sessions, Unit Group sessions and Medication administration.  Evaluation of Outcomes: Not Met   RN Treatment Plan for Primary Diagnosis: MDD (major depressive disorder), recurrent, severe, with psychosis ([place]) Long Term Goal(s): Knowledge of disease and therapeutic regimen to maintain health will improve  Short Term Goals: Ability to remain free from injury will improve, Ability to demonstrate self-control, Ability to participate in decision making will improve, Ability to verbalize feelings will improve, Ability to disclose and discuss suicidal ideas, Ability to identify and develop effective coping behaviors will improve and Compliance with prescribed medications will improve  Medication Management: RN will  administer medications as ordered by provider, will assess and evaluate patient's response and provide education to patient for prescribed medication. RN will report any adverse and/or side effects to prescribing provider.  Therapeutic Interventions: 1 on 1 counseling sessions, Psychoeducation, Medication administration, Evaluate responses to treatment, Monitor vital signs and CBGs as ordered, Perform/monitor CIWA, COWS, AIMS and Fall Risk screenings as ordered, Perform wound care treatments as ordered.  Evaluation of Outcomes: Not Met   LCSW Treatment Plan for Primary Diagnosis: MDD (major depressive disorder), recurrent, severe, with psychosis ([place]) Long Term Goal(s): Safe transition to appropriate next level of care at discharge, Engage patient in therapeutic group addressing interpersonal concerns.  Short Term Goals: Engage patient in aftercare planning with referrals and resources, Increase social support, Increase ability to appropriately verbalize feelings, Increase emotional regulation, Facilitate acceptance of mental health diagnosis and concerns, Facilitate patient progression through stages of change regarding substance use diagnoses and concerns, Identify triggers associated with mental health/substance abuse issues and Increase skills for wellness and recovery  Therapeutic Interventions: Assess for all discharge needs, 1 to 1 time with Social worker, Explore available resources and support systems, Assess for adequacy in community support network, Educate family and significant other(s) on suicide prevention, Complete Psychosocial Assessment, Interpersonal group therapy.  Evaluation of Outcomes: Not Met   Progress in Treatment: Attending groups: No. Participating in groups: No. Taking medication as prescribed: Yes. Toleration medication: Yes. Family/Significant other contact made: No, will contact:  when given permission. Patient understands diagnosis: Yes. Discussing patient  identified problems/goals with staff: Yes. Medical problems stabilized or resolved: Yes. Denies suicidal/homicidal ideation: Yes. Issues/concerns per patient self-inventory: No. Other: None.  New problem(s) identified: No, Describe:  none.  New Short Term/Long Term Goal(s): detox, elimination of symptoms of psychosis, medication management for mood  stabilization; elimination of SI thoughts; development of comprehensive mental wellness/sobriety plan.  Patient Goals: "I just want to get these thoughts out of my head."    Discharge Plan or Barriers: CSW will assist pt with development of an appropriate aftercare//discharge plan. Pt to be given information regarding residential/inpt substance use treatment.   Reason for Continuation of Hospitalization: Depression Hallucinations Medication stabilization Withdrawal symptoms  Estimated Length of Stay: 1-7 days  Attendees: Patient: Cristina Finley 10/20/2020 9:12 AM  Physician: Selina Cooley, MD 10/20/2020 9:12 AM  Nursing: West Pugh, RN 10/20/2020 9:12 AM  RN Care Manager: 10/20/2020 9:12 AM  Social Worker: Chalmers Guest. Guerry Bruin, MSW, [place], [place] 10/20/2020 9:12 AM  Recreational Therapist: Devin Going, LRT  10/20/2020 9:12 AM  Other: Kiva Martinique, MSW, LCSW-A 10/20/2020 9:12 AM  Other: Paulla Dolly, MSW, [place], [place] 10/20/2020 9:12 AM  Other: 10/20/2020 9:12 AM    Scribe for Treatment Team: Shirl Harris, LCSW 10/20/2020 9:12 AM

## 2020-10-21 DIAGNOSIS — F333 Major depressive disorder, recurrent, severe with psychotic symptoms: Secondary | ICD-10-CM | POA: Diagnosis not present

## 2020-10-21 MED ORDER — ARIPIPRAZOLE 10 MG PO TABS
10.0000 mg | ORAL_TABLET | Freq: Every day | ORAL | Status: DC
  Administered 2020-10-22 – 2020-10-23 (×2): 10 mg via ORAL
  Filled 2020-10-21 (×2): qty 1

## 2020-10-21 MED ORDER — GUAIFENESIN 100 MG/5ML PO SOLN
200.0000 mg | ORAL | Status: DC | PRN
  Administered 2020-10-23: 200 mg via ORAL
  Filled 2020-10-21 (×2): qty 10

## 2020-10-21 NOTE — Progress Notes (Signed)
Recreation Therapy Notes    Date: 10/21/2020   Time:9:30am   Location: Craft room   Behavioral response: N/A   Intervention Topic: Strengths     Discussion/Intervention: Patient did not attend group.   Clinical Observations/Feedback:  Patient did not attend group.   Donny Heffern LRT/CTRS          Shakir Petrosino 10/21/2020 10:08 AM

## 2020-10-21 NOTE — Plan of Care (Signed)
Patient during assessments this morning endorsed a sad and depressive mood and affect was congruent to mood (constricted and flat). Pt. Denied hi/avh, but endorsed vague passive suicidal ideations with an ability to continue to remain safe on the unit. Pt. Orientation appeared grossly intact. Pt. Denied physical pain and endorsed toleration of medications thus far. Pt. Endorsed no problems with sleeping, but appetite is poor this morning. Pt. Had no complaints for this Clinical research associate. Pt. Overall engagement was superficial and minimal. Pt. Thought process was superficially linear.   Patient has been complaint with medications and unit procedures thus far. Pt. Has been observed eating fair thus far for breakfast, but overall appetite is poor. Pt. Has been able to remain safe on the unit thus far. Pt thus far mostly observed in the day room watching tv around peers, but keeping to herself.    Q x 15 minute observation checks in place/maintained for safety. Patient is provided with education throughout shift when appropriate and able.  Patient is given/offered medications per orders. Patient is encouraged to attend groups, participate in unit activities and continue with plan of care. Pt. Chart and plans of care reviewed. Pt. Given support and encouragement when appropriate and able.      Problem: Coping: Goal: Coping ability will improve Outcome: Not Progressing Note: Pt. Endorsed a sad and depressive mood with vague passive suicidal ideations.  Goal: Will verbalize feelings Outcome: Not Progressing Note: Pt. This morning was guarded, forward little, and was only minimally engaged.   Problem: Health Behavior/Discharge Planning: Goal: Compliance with therapeutic regimen will improve Outcome: Progressing Note: Pt. Complaint with medications and unit procedures thus far.

## 2020-10-21 NOTE — [facility] Group Notes (Signed)
LCSW Group Therapy Note  10/21/2020 2:47 PM  Type of Therapy/Topic:  Group Therapy:  Balance in Life  Participation Level:  Minimal  Description of Group:    This group will address the concept of balance and how it feels and looks when one is unbalanced. Patients will be encouraged to process areas in their lives that are out of balance and identify reasons for remaining unbalanced. Facilitators will guide patients in utilizing problem-solving interventions to address and correct the stressor making their life unbalanced. Understanding and applying boundaries will be explored and addressed for obtaining and maintaining a balanced life. Patients will be encouraged to explore ways to assertively make their unbalanced needs known to significant others in their lives, using other group members and facilitator for support and feedback.  Therapeutic Goals: 1. Patient will identify two or more emotions or situations they have that consume much of in their lives. 2. Patient will identify signs/triggers that life has become out of balance:  3. Patient will identify two ways to set boundaries in order to achieve balance in their lives:  4. Patient will demonstrate ability to communicate their needs through discussion and/or role plays  Summary of Patient Progress: Pt was present for the entirety of the group. She participated only when directly called on. During period when all peers had stopped out of the room, pt was more open to speaking about what was going on with her. Pt is struggling with finding balance for herself as she only recalls being able to find balance through her husband (with whom she is separated).   Therapeutic Modalities:   Cognitive Behavioral Therapy Solution-Focused Therapy Assertiveness Training  [employer]. Algis Greenhouse, MSW, LCSW, LCAS 10/21/2020 2:47 PM

## 2020-10-21 NOTE — Progress Notes (Signed)
Patient continues to endorse depression and anxiety. She states that she is starting to feel better, but has a long way to go.  She presents as sullen and depressed. She continues to admit to having passive SI thoughts, but has contracted for safety.  She is active on the unit hanging out in the dayroom and engages well with there patients on the unit.  She continues to be monitored with 15 minute safety checks and was encouraged to come to staff with any concerns.   Cleo Butler-Nicholson, LPN

## 2020-10-21 NOTE — Plan of Care (Signed)
  Problem: Self-Concept: Goal: Ability to identify factors that promote anxiety will improve Outcome: Progressing Goal: Level of anxiety will decrease Outcome: Progressing Goal: Ability to modify response to factors that promote anxiety will improve Outcome: Progressing   Problem: Education: Goal: Utilization of techniques to improve thought processes will improve Outcome: Progressing Goal: Knowledge of the prescribed therapeutic regimen will improve Outcome: Progressing   Problem: Education: Goal: Ability to state activities that reduce stress will improve Outcome: Progressing   Problem: Coping: Goal: Ability to identify and develop effective coping behavior will improve Outcome: Progressing   Problem: Self-Concept: Goal: Will verbalize positive feelings about self Outcome: Progressing Goal: Level of anxiety will decrease Outcome: Progressing   Problem: Safety: Goal: Ability to disclose and discuss suicidal ideas will improve Outcome: Progressing Goal: Ability to identify and utilize support systems that promote safety will improve Outcome: Progressing   Problem: Role Relationship: Goal: Will demonstrate positive changes in social behaviors and relationships Outcome: Progressing

## 2020-10-21 NOTE — Progress Notes (Signed)
[facility] MD Progress Note  10/21/2020 10:26 AM Cristina Finley  MRN:  [id number] CC "Still depressed."  Subjective:  45 year old female with history of depression presenting for worsening mood and suicidal ideations. No acute events overnight, medication compliant, attending to ADLs. Patient seen one-on-one this morning. Her affect remains constricted and dysphoric. She endorses continued depression and near constant thoughts of suicide. However, she is able to contract for safety. She continues to endorse auditory hallucinations of whispering voices that vary in intensity during the day. She cannot understand what they are saying currently. She denies any visual hallucinations, or homicidal ideations. Denies any medication side effects. She is agreeable to increase in Abilify to assist with mood and auditory hallucinations. She also endorses a cough that she believes is a "smoker cough." PRN guanfacine ordered as well.   Principal Problem: MDD (major depressive disorder), recurrent, severe, with psychosis (HCC) Diagnosis: Principal Problem:   MDD (major depressive disorder), recurrent, severe, with psychosis (HCC) Active Problems:   Alcohol abuse   Nicotine abuse  Total Time spent with patient: 30 minutes  Past Psychiatric History: See H&P  Past Medical History: History reviewed. No pertinent past medical history. History reviewed. No pertinent surgical history. Family History: History reviewed. No pertinent family history. Family Psychiatric  History: See H&P Social History:  Social History   Substance and Sexual Activity  Alcohol Use Yes  . Alcohol/week: 6.0 standard drinks  . Types: 6 Cans of beer per week   Comment: one 6 pack daily     Social History   Substance and Sexual Activity  Drug Use Yes  . Types: Marijuana   Comment: last used 2 days ago per pt    Social History   Socioeconomic History  . Marital status: Married    Spouse name: Not on file  . Number of children: Not  on file  . Years of education: Not on file  . Highest education level: Not on file  Occupational History  . Not on file  Tobacco Use  . Smoking status: Current Every Day Smoker    Packs/day: 1.50    Types: Cigarettes  . Smokeless tobacco: Never Used  Vaping Use  . Vaping Use: Never used  Substance and Sexual Activity  . Alcohol use: Yes    Alcohol/week: 6.0 standard drinks    Types: 6 Cans of beer per week    Comment: one 6 pack daily  . Drug use: Yes    Types: Marijuana    Comment: last used 2 days ago per pt  . Sexual activity: Yes    Partners: Male    Birth control/protection: Surgical  Other Topics Concern  . Not on file  Social History Narrative  . Not on file   Social Determinants of Health   Financial Resource Strain: Not on file  Food Insecurity: Not on file  Transportation Needs: Not on file  Physical Activity: Not on file  Stress: Not on file  Social Connections: Not on file   Additional Social History:     Sleep: Fair  Appetite:  Poor  Current Medications: Current Facility-Administered Medications  Medication Dose Route Frequency Provider Last Rate Last Admin  . acetaminophen (TYLENOL) tablet 650 mg  650 mg Oral Q6H PRN [name], [name], MD      . alum & mag hydroxide-simeth (MAALOX/MYLANTA) 200-200-20 MG/5ML suspension 30 mL  30 mL Oral Q4H PRN [name], [name], MD      . Melene Muller ON 10/22/2020] ARIPiprazole (ABILIFY) tablet 10 mg  10 mg Oral Daily [name], MD      . citalopram (CELEXA) tablet 20 mg  20 mg Oral Daily [name], [name], MD   20 mg at 10/21/20 0729  . feeding supplement (ENSURE ENLIVE / ENSURE PLUS) liquid 237 mL  237 mL Oral BID BM [name], MD   237 mL at 10/21/20 0914  . guaiFENesin tablet 200 mg  200 mg Oral Q4H PRN [name], MD      . hydrOXYzine (ATARAX/VISTARIL) tablet 25 mg  25 mg Oral TID PRN [name], [name], MD   25 mg at 10/20/20 1352  . magnesium hydroxide (MILK OF MAGNESIA) suspension 30 mL  30 mL Oral  Daily PRN [name], [name], MD      . multivitamin with minerals tablet 1 tablet  1 tablet Oral Daily [name], MD   1 tablet at 10/21/20 7619  . nicotine (NICODERM CQ - dosed in mg/24 hours) patch 21 mg  21 mg Transdermal Daily [name], [name], MD   21 mg at 10/21/20 0729  . pneumococcal 23 valent vaccine (PNEUMOVAX-23) injection 0.5 mL  0.5 mL Intramuscular Tomorrow-1000 [name], MD      . traZODone (DESYREL) tablet 50 mg  50 mg Oral QHS PRN [name], [name], MD   50 mg at 10/20/20 2139    Lab Results:  Results for orders placed or performed during the hospital encounter of 10/19/20 (from the past 48 hour(s))  Hemoglobin A1c     Status: None   Collection Time: 10/20/20  6:40 AM  Result Value Ref Range   Hgb A1c MFr Bld 5.6 4.8 - 5.6 %    Comment: (NOTE) Pre diabetes:          5.7%-6.4%  Diabetes:              >6.4%  Glycemic control for   <7.0% adults with diabetes    Mean Plasma Glucose 114.02 mg/dL    Comment: Performed at [facility], [address] [place], [place] [address]  Lipid panel     Status: Abnormal   Collection Time: 10/20/20  6:40 AM  Result Value Ref Range   Cholesterol 184 0 - 200 mg/dL   Triglycerides 671 <245 mg/dL   HDL 51 >80 mg/dL   Total CHOL/HDL Ratio 3.6 RATIO   VLDL 29 0 - 40 mg/dL   LDL Cholesterol 998 (H) 0 - 99 mg/dL    Comment:        Total Cholesterol/HDL:CHD Risk Coronary Heart Disease Risk Table                     Men   Women  1/2 Average Risk   3.4   3.3  Average Risk       5.0   4.4  2 X Average Risk   9.6   7.1  3 X Average Risk  23.4   11.0        Use the calculated Patient Ratio above and the CHD Risk Table to determine the patient's CHD Risk.        ATP III CLASSIFICATION (LDL):  <100     mg/dL   Optimal  338-250  mg/dL   Near or Above                    Optimal  130-159  mg/dL   Borderline  539-767  mg/dL   High  >  190     mg/dL   Very High Performed at [facility], [address]  [place], [place] [address]   TSH     Status: None   Collection Time: 10/20/20  6:40 AM  Result Value Ref Range   TSH 2.721 0.350 - 4.500 uIU/mL    Comment: Performed by a 3rd Generation assay with a functional sensitivity of <=0.01 uIU/mL. Performed at [facility], [address] [place], [place] [address]     Blood Alcohol level:  Lab Results  Component Value Date   [facility] <10 10/18/2020    Metabolic Disorder Labs: Lab Results  Component Value Date   HGBA1C 5.6 10/20/2020   MPG 114.02 10/20/2020   No results found for: PROLACTIN Lab Results  Component Value Date   CHOL 184 10/20/2020   TRIG 143 10/20/2020   HDL 51 10/20/2020   CHOLHDL 3.6 10/20/2020   VLDL 29 10/20/2020   LDLCALC 104 (H) 10/20/2020    Physical Findings: AIMS: Facial and Oral Movements Muscles of Facial Expression: None, normal Lips and Perioral Area: None, normal Jaw: None, normal Tongue: None, normal,Extremity Movements Upper (arms, wrists, hands, fingers): None, normal Lower (legs, knees, ankles, toes): None, normal, Trunk Movements Neck, shoulders, hips: None, normal, Overall Severity Severity of abnormal movements (highest score from questions above): None, normal Incapacitation due to abnormal movements: None, normal Patient's awareness of abnormal movements (rate only patient's report): No Awareness, Dental Status Current problems with teeth and/or dentures?: No Does patient usually wear dentures?: No  CIWA:  CIWA-Ar Total: 2 COWS:  COWS Total Score: 1  Musculoskeletal: Strength & Muscle Tone: within normal limits Gait & Station: normal Patient leans: N/A  Psychiatric Specialty Exam:  Presentation  General Appearance: Casual  Eye Contact:Fair  Speech:Normal Rate  Speech Volume:Normal  Handedness:Right   Mood and Affect  Mood:Depressed; Anxious  Affect:Congruent; Tearful   Thought Process  Thought Processes:Coherent  Descriptions of  Associations:Intact  Orientation:Full (Time, Place and Person)  Thought Content:Logical  History of Schizophrenia/Schizoaffective disorder:No  Duration of Psychotic Symptoms:Less than six months  Hallucinations:Hallucinations: Auditory  Ideas of Reference:None  Suicidal Thoughts:Suicidal Thoughts: Yes, Active SI Active Intent and/or Plan: Without Means to Carry Out  Homicidal Thoughts:Homicidal Thoughts: No   Sensorium  Memory:Immediate Fair; Recent Fair; Remote Fair  Judgment:Intact  Insight:Present   Executive Functions  Concentration:Fair  Attention Span:Fair  Recall:Fair  Fund of Knowledge:Fair  Language:Fair   Psychomotor Activity  Psychomotor Activity:Psychomotor Activity: Normal   Assets  Assets:Communication Skills; Desire for Improvement; Physical Health; Resilience   Sleep  Sleep:Sleep: Fair Number of Hours of Sleep: 7.15    Physical Exam: Physical Exam  ROS  Blood pressure 119/74, pulse 77, temperature 98.3 F (36.8 C), temperature source Oral, resp. rate 18, height 5\' 4"  (1.626 m), weight 47.6 kg, last menstrual period 09/22/2020, SpO2 99 %. Body mass index is 18.02 kg/m.   Treatment Plan Summary: Daily contact with patient to assess and evaluate symptoms and progress in treatment and Medication management Patient presenting with MDD, recurrent, severe with psychotic features. She continues to endorse suicidal ideations, but is able to contract for safety in the hospital. Continue Celexa 20 mg daily, increase Abilify 20 mg daily for auditory hallucinations and mood. Nicoderm patch for tobacco use disorder. Alcohol cessation counseling provided. Guanfacine PRN for cough. Once mood is more stable, will also discuss adding an FDA approved medication for alcohol use disorder such as Natrexone or Acamprosate.   09/24/2020, MD 10/21/2020,  10:26 AM

## 2020-10-22 DIAGNOSIS — F333 Major depressive disorder, recurrent, severe with psychotic symptoms: Secondary | ICD-10-CM | POA: Diagnosis not present

## 2020-10-22 MED ORDER — NALTREXONE HCL 50 MG PO TABS
50.0000 mg | ORAL_TABLET | Freq: Every day | ORAL | Status: DC
  Administered 2020-10-22 – 2020-11-15 (×24): 50 mg via ORAL
  Filled 2020-10-22 (×28): qty 1

## 2020-10-22 NOTE — Progress Notes (Signed)
[facility] MD Progress Note  10/22/2020 10:08 AM Cristina Finley  MRN:  [id number] CC "Thoughts are running around in my head"  Subjective:  45 year old female with history of depression presenting for worsening mood and suicidal ideations. No acute events overnight, medication compliant, attending to ADLs. Patient seen one-on-one this morning. Affect remains constricted, dysphoric, and tearful. She notes that her thoughts are racing today. She is having a hard time not thinking about suicide, but is able to contract for safety. She continues to have auditory hallucinations as well. Denies any visual hallucinations or homicidal ideations. She has been attending groups and finds these somewhat helpful. She admits to alcohol cravings. Discussed RBA of Naltrexone and Acamprosate with patient, and she is agreeable to starting Naltrexone here in the hospital. She is also interested in residential treatment for substance abuse treatment once suicidal thoughts and depression are improved.   Principal Problem: MDD (major depressive disorder), recurrent, severe, with psychosis (HCC) Diagnosis: Principal Problem:   MDD (major depressive disorder), recurrent, severe, with psychosis (HCC) Active Problems:   Alcohol abuse   Nicotine abuse  Total Time spent with patient: 30 minutes  Past Psychiatric History: See H&P  Past Medical History: History reviewed. No pertinent past medical history. History reviewed. No pertinent surgical history. Family History: History reviewed. No pertinent family history. Family Psychiatric  History: See H&P Social History:  Social History   Substance and Sexual Activity  Alcohol Use Yes  . Alcohol/week: 6.0 standard drinks  . Types: 6 Cans of beer per week   Comment: one 6 pack daily     Social History   Substance and Sexual Activity  Drug Use Yes  . Types: Marijuana   Comment: last used 2 days ago per pt    Social History   Socioeconomic History  . Marital status:  Married    Spouse name: Not on file  . Number of children: Not on file  . Years of education: Not on file  . Highest education level: Not on file  Occupational History  . Not on file  Tobacco Use  . Smoking status: Current Every Day Smoker    Packs/day: 1.50    Types: Cigarettes  . Smokeless tobacco: Never Used  Vaping Use  . Vaping Use: Never used  Substance and Sexual Activity  . Alcohol use: Yes    Alcohol/week: 6.0 standard drinks    Types: 6 Cans of beer per week    Comment: one 6 pack daily  . Drug use: Yes    Types: Marijuana    Comment: last used 2 days ago per pt  . Sexual activity: Yes    Partners: Male    Birth control/protection: Surgical  Other Topics Concern  . Not on file  Social History Narrative  . Not on file   Social Determinants of Health   Financial Resource Strain: Not on file  Food Insecurity: Not on file  Transportation Needs: Not on file  Physical Activity: Not on file  Stress: Not on file  Social Connections: Not on file   Additional Social History:     Sleep: Fair  Appetite:  Poor  Current Medications: Current Facility-Administered Medications  Medication Dose Route Frequency Provider Last Rate Last Admin  . acetaminophen (TYLENOL) tablet 650 mg  650 mg Oral Q6H PRN [name], [name], MD      . alum & mag hydroxide-simeth (MAALOX/MYLANTA) 200-200-20 MG/5ML suspension 30 mL  30 mL Oral Q4H PRN [name], [name], MD      .  ARIPiprazole (ABILIFY) tablet 10 mg  10 mg Oral Daily [name], MD   10 mg at 10/22/20 0750  . citalopram (CELEXA) tablet 20 mg  20 mg Oral Daily [name], [name], MD   20 mg at 10/22/20 0750  . feeding supplement (ENSURE ENLIVE / ENSURE PLUS) liquid 237 mL  237 mL Oral BID BM [name], MD   237 mL at 10/21/20 1353  . guaiFENesin (ROBITUSSIN) 100 MG/5ML solution 200 mg  200 mg Oral Q4H PRN [name], MD      . hydrOXYzine (ATARAX/VISTARIL) tablet 25 mg  25 mg Oral TID PRN [name], [name], MD   25 mg  at 10/20/20 1352  . magnesium hydroxide (MILK OF MAGNESIA) suspension 30 mL  30 mL Oral Daily PRN [name], [name], MD      . multivitamin with minerals tablet 1 tablet  1 tablet Oral Daily [name], MD   1 tablet at 10/22/20 0750  . naltrexone (DEPADE) tablet 50 mg  50 mg Oral Daily [name], MD      . nicotine (NICODERM CQ - dosed in mg/24 hours) patch 21 mg  21 mg Transdermal Daily [name], [name], MD   21 mg at 10/22/20 0750  . pneumococcal 23 valent vaccine (PNEUMOVAX-23) injection 0.5 mL  0.5 mL Intramuscular Tomorrow-1000 [name], MD      . traZODone (DESYREL) tablet 50 mg  50 mg Oral QHS PRN [name], [name], MD   50 mg at 10/20/20 2139    Lab Results:  No results found for this or any previous visit (from the past 48 hour(s)).  Blood Alcohol level:  Lab Results  Component Value Date   ETH <10 10/18/2020    Metabolic Disorder Labs: Lab Results  Component Value Date   HGBA1C 5.6 10/20/2020   MPG 114.02 10/20/2020   No results found for: PROLACTIN Lab Results  Component Value Date   CHOL 184 10/20/2020   TRIG 143 10/20/2020   HDL 51 10/20/2020   CHOLHDL 3.6 10/20/2020   VLDL 29 10/20/2020   LDLCALC 104 (H) 10/20/2020    Physical Findings: AIMS: Facial and Oral Movements Muscles of Facial Expression: None, normal Lips and Perioral Area: None, normal Jaw: None, normal Tongue: None, normal,Extremity Movements Upper (arms, wrists, hands, fingers): None, normal Lower (legs, knees, ankles, toes): None, normal, Trunk Movements Neck, shoulders, hips: None, normal, Overall Severity Severity of abnormal movements (highest score from questions above): None, normal Incapacitation due to abnormal movements: None, normal Patient's awareness of abnormal movements (rate only patient's report): No Awareness, Dental Status Current problems with teeth and/or dentures?: No Does patient usually wear dentures?: No  CIWA:  CIWA-Ar Total: 2 COWS:  COWS Total  Score: 1  Musculoskeletal: Strength & Muscle Tone: within normal limits Gait & Station: normal Patient leans: N/A  Psychiatric Specialty Exam:  Presentation  General Appearance: Casual  Eye Contact:Fair  Speech:Normal Rate  Speech Volume:Normal  Handedness:Right   Mood and Affect  Mood:Depressed; Anxious  Affect:Congruent; Tearful   Thought Process  Thought Processes:Coherent  Descriptions of Associations:Intact  Orientation:Full (Time, Place and Person)  Thought Content:Logical  History of Schizophrenia/Schizoaffective disorder:No  Duration of Psychotic Symptoms:Less than six months  Hallucinations:Audiotry hallucinations of whispers Ideas of Reference:None  Suicidal Thoughts: Yes, suicidal ideations with plan to OD, no access to means here in the hospital  Homicidal Thoughts:Denies  Sensorium  Memory:Immediate Fair; Recent Fair; Remote Fair  Judgment:Intact  Insight:Present  Executive Functions  Concentration:Fair  Attention Span:Fair  Recall:Fair  [employer] of Knowledge:Fair  Language:Fair   Psychomotor Activity  Psychomotor Activity:Decreased  Assets  Assets:Communication Skills; Desire for Improvement; Physical Health; Resilience   Sleep  Sleep:Fair, 8.5 hours   Physical Exam: Physical Exam  ROS  Blood pressure 116/70, pulse 80, temperature 98.3 F (36.8 C), temperature source Oral, resp. rate 18, height 5\' 4"  (1.626 m), weight 47.6 kg, last menstrual period 09/22/2020, SpO2 99 %. Body mass index is 18.02 kg/m.   Treatment Plan Summary: Daily contact with patient to assess and evaluate symptoms and progress in treatment and Medication management Patient presenting with MDD, recurrent, severe with psychotic features. She continues to endorse suicidal ideations, but is able to contract for safety in the hospital. Continue Celexa 20 mg daily,  Abilify 10 mg daily (increased yesterday) for auditory hallucinations and mood. Nicoderm  patch for tobacco use disorder RBA of Naltrexone and Acamprosate discussed with patient, and she is agreeable to starting Naltrexone for alcohol use disorder.   10/22/20: Psychiatric exam above reviewed and remains accurate. Assessment and plan above reviewed and updated.    10/24/20, MD 10/22/2020, 10:08 AM

## 2020-10-22 NOTE — Progress Notes (Signed)
Patient has been isolative to room. Mood is sad. Affect is very flat. She speaks in a very soft, monotone voice. When asked if she was having suicidal thoughts, she said "not now" and was able to contract verbally for safety, saying "there's nothing here I could really do".

## 2020-10-22 NOTE — Progress Notes (Signed)
Recreation Therapy Notes  Date: 10/22/2020  Time: 9:30 am   Location: Craft room   Behavioral response: Appropriate  Intervention Topic: Happiness  Discussion/Intervention:  Group content today was focused on Happiness. The group defined happiness and described where happiness comes from. Individuals identified what makes them happy and how they go about making others happy. Patients expressed things that stop them from being happy and ways they can improve their happiness. The group stated reasons why it is important to be happy. The group participated in the intervention "My Happiness", where they had a chance to identify and express things that make them happy. Clinical Observations/Feedback: Patient came to group defined happiness as being with family. She identified crafts, reading and music as what contributes to her happiness. Participant explained that happiness is important to because it is good for your soul. Patient expressed that she could improve her happiness by talking to her family more. Individual was social with staff and peers while participating in the intervention. [name] LRT/CTRS         Travia Onstad 10/22/2020 11:39 AM

## 2020-10-22 NOTE — [facility] Group Notes (Signed)
LCSW Group Therapy Note  10/22/2020 2:05 PM  Type of Therapy and Topic:  Group Therapy:  Feelings around Relapse and Recovery  Participation Level:  Active   Description of Group:    Patients in this group will discuss emotions they experience before and after a relapse. They will process how experiencing these feelings, or avoidance of experiencing them, relates to having a relapse. Facilitator will guide patients to explore emotions they have related to recovery. Patients will be encouraged to process which emotions are more powerful. They will be guided to discuss the emotional reaction significant others in their lives may have to their relapse or recovery. Patients will be assisted in exploring ways to respond to the emotions of others without this contributing to a relapse.  Therapeutic Goals: 1. Patient will identify two or more emotions that lead to a relapse for them 2. Patient will identify two emotions that result when they relapse 3. Patient will identify two emotions related to recovery 4. Patient will demonstrate ability to communicate their needs through discussion and/or role plays   Summary of Patient Progress: Patient was present for the entirety of the group session. Patient was an active listener and participated in the topic of discussion, provided helpful advice to others, and added nuance to topic of conversation. Patient reported that she had difficulty moderating alcohol consumption and reported she has had success remaining free from alcohol in the past by going to church and [employer]     Therapeutic Modalities:   Cognitive Behavioral Therapy Solution-Focused Therapy Assertiveness Training Relapse Prevention Therapy   Gwenevere Ghazi, MSW, [place], [place] 10/22/2020 2:05 PM

## 2020-10-22 NOTE — B[facility] Counselor (Signed)
CSW provided patient with contact information to [employer] [facility] (residential SUD treatment). CSW encouraged patient to call and complete admissions interview and get information about program.   Signed:  Corky Crafts, MSW, [place], LCASA 10/22/2020 2:36 PM

## 2020-10-22 NOTE — Plan of Care (Signed)
  Problem: Education: Goal: Ability to state activities that reduce stress will improve Outcome: Progressing   Problem: Coping: Goal: Ability to identify and develop effective coping behavior will improve Outcome: Progressing   Problem: Self-Concept: Goal: Ability to identify factors that promote anxiety will improve Outcome: Progressing Goal: Level of anxiety will decrease Outcome: Progressing Goal: Ability to modify response to factors that promote anxiety will improve Outcome: Progressing   Problem: Education: Goal: Utilization of techniques to improve thought processes will improve Outcome: Progressing Goal: Knowledge of the prescribed therapeutic regimen will improve Outcome: Progressing   Problem: Activity: Goal: Interest or engagement in leisure activities will improve Outcome: Progressing Goal: Imbalance in normal sleep/wake cycle will improve Outcome: Progressing   Problem: Coping: Goal: Coping ability will improve Outcome: Progressing Goal: Will verbalize feelings Outcome: Progressing   Problem: Health Behavior/Discharge Planning: Goal: Ability to make decisions will improve Outcome: Progressing Goal: Compliance with therapeutic regimen will improve Outcome: Progressing   Problem: Role Relationship: Goal: Will demonstrate positive changes in social behaviors and relationships Outcome: Progressing   Problem: Safety: Goal: Ability to disclose and discuss suicidal ideas will improve Outcome: Progressing Goal: Ability to identify and utilize support systems that promote safety will improve Outcome: Progressing   Problem: Self-Concept: Goal: Will verbalize positive feelings about self Outcome: Progressing Goal: Level of anxiety will decrease Outcome: Progressing   

## 2020-10-22 NOTE — Plan of Care (Signed)
Patient verbalized that her depression is getting little better but  passive suicidal thoughts.Contracts for safety. Verbalized very minimal withdrawal symptoms. In & out of room. Appropriate with staff & peers. Compliant with medications. Appetite and energy level good. Support and encouragement given.

## 2020-10-23 DIAGNOSIS — F333 Major depressive disorder, recurrent, severe with psychotic symptoms: Secondary | ICD-10-CM | POA: Diagnosis not present

## 2020-10-23 MED ORDER — ARIPIPRAZOLE 5 MG PO TABS
5.0000 mg | ORAL_TABLET | Freq: Once | ORAL | Status: AC
  Administered 2020-10-23: 5 mg via ORAL
  Filled 2020-10-23: qty 1

## 2020-10-23 MED ORDER — ARIPIPRAZOLE 5 MG PO TABS
15.0000 mg | ORAL_TABLET | Freq: Every day | ORAL | Status: DC
  Filled 2020-10-23: qty 1

## 2020-10-23 NOTE — Progress Notes (Signed)
D: Pt alert and oriented. Pt rates depression 10/10, hopelessness 8/10, and anxiety 10/10, pt refused prn medications stating she would let this writer know if she wanted/needed them. Pt reports energy level as low and concentration as being poor. Pt reports sleep last night as being poor. Pt denies experiencing any pain at this time. Pt denies experiencing any SI/HI, or VH at this time, however endorses AH.   Pt observed by this Clinical research associate and MHT responding to internal stimuli. Pt had all her belongings from her room and attempting to leave out the back door on her hall. When asked where she was going and if she was okay she stated she didn't know what she was doing. Later that evening after dinner the pt was observed in her bathroom first leaned over her toilet talking to/down into her toilet, followed by sitting on the floor between her shower and sink. When asked if she was okay and what she was doing the pt replied she was talking to her toilet. When asked was the pt stated because she had to. When asked why she had to the pt replied she was told to then got up and sat on her bed. When asked if she was hearing or seeing anything the pt stated she would be okay and motioned for this writer to leave. This writer reassured the pt she could talk to this Clinical research associate or any staff member about what she was experiencing that it was okay and nothing to be embarrassed about. This Clinical research associate then asked if the voices were telling her not to talk to this Clinical research associate. The pt stated again she was going to be okay and motioned for this Clinical research associate to leave.  A: Scheduled medications administered to pt, per MD orders. Support and encouragement provided. Frequent verbal contact made. Routine safety checks conducted q15 minutes.   R: No adverse drug reactions noted. Pt verbally contracts for safety at this time. Pt complaint with medications. Pt interacts minimmally with others on the unit mostly staying in her room with the lights out sitting  on her bed closest the the window facing the window. Pt remains safe at this time. Will continue to monitor.

## 2020-10-23 NOTE — Progress Notes (Signed)
Pt contacted sheriff's department this afternoon as well stating where she was and that she wanted to turn herself in. MD made aware and pt has no pending charges. Pt made aware and still stating this evening that she needs to contact the sheriff's office.   Pt also observed standing and at time sitting in doorway with arms extended up toward the sky. Pt will not share why she is doing this just that she has to.

## 2020-10-23 NOTE — Progress Notes (Signed)
[facility] MD Progress Note  10/23/2020 11:21 AM Cristina Finley  MRN:  [id number] CC "I just don't know"  Subjective:  45 year old female with history of depression presenting for worsening mood and suicidal ideations. No acute events overnight, medication compliant, attending to ADLs.   This morning I was alerted by nursing staff that patient had placed a call to 911 trying to turn herself in. She has no past legal charges, and no upcoming court dates found on public record. When patient approached this morning she is anxious and dysphoric. She has a very hard time maintaining eye contact, or answering questions. She notes she is hearing a lot of voices. When asked what they are saying, patient continues to state "I don't know." Responses are delayed, and she appears to be responding to internal stimuli. She states she is unsure if she did something bad, or if it is just the voices. Patient reassured that she has no pending legal charges in the system. She continues to have suicdal ideations. She denies any homicidal ideations or visual hallucinations. Will increase Abilify today for mood and hallucinations.    Principal Problem: MDD (major depressive disorder), recurrent, severe, with psychosis (HCC) Diagnosis: Principal Problem:   MDD (major depressive disorder), recurrent, severe, with psychosis (HCC) Active Problems:   Alcohol abuse   Nicotine abuse  Total Time spent with patient: 30 minutes  Past Psychiatric History: See H&P  Past Medical History: History reviewed. No pertinent past medical history. History reviewed. No pertinent surgical history. Family History: History reviewed. No pertinent family history. Family Psychiatric  History: See H&P Social History:  Social History   Substance and Sexual Activity  Alcohol Use Yes  . Alcohol/week: 6.0 standard drinks  . Types: 6 Cans of beer per week   Comment: one 6 pack daily     Social History   Substance and Sexual Activity  Drug Use  Yes  . Types: Marijuana   Comment: last used 2 days ago per pt    Social History   Socioeconomic History  . Marital status: Married    Spouse name: Not on file  . Number of children: Not on file  . Years of education: Not on file  . Highest education level: Not on file  Occupational History  . Not on file  Tobacco Use  . Smoking status: Current Every Day Smoker    Packs/day: 1.50    Types: Cigarettes  . Smokeless tobacco: Never Used  Vaping Use  . Vaping Use: Never used  Substance and Sexual Activity  . Alcohol use: Yes    Alcohol/week: 6.0 standard drinks    Types: 6 Cans of beer per week    Comment: one 6 pack daily  . Drug use: Yes    Types: Marijuana    Comment: last used 2 days ago per pt  . Sexual activity: Yes    Partners: Male    Birth control/protection: Surgical  Other Topics Concern  . Not on file  Social History Narrative  . Not on file   Social Determinants of Health   Financial Resource Strain: Not on file  Food Insecurity: Not on file  Transportation Needs: Not on file  Physical Activity: Not on file  Stress: Not on file  Social Connections: Not on file   Additional Social History:     Sleep: Fair  Appetite:  Poor  Current Medications: Current Facility-Administered Medications  Medication Dose Route Frequency Provider Last Rate Last Admin  . acetaminophen (TYLENOL) tablet 650  mg  650 mg Oral Q6H PRN [name], [name], MD      . alum & mag hydroxide-simeth (MAALOX/MYLANTA) 200-200-20 MG/5ML suspension 30 mL  30 mL Oral Q4H PRN [name], [name], MD      . Melene Muller ON 10/24/2020] ARIPiprazole (ABILIFY) tablet 15 mg  15 mg Oral Daily Les Pou M, MD      . ARIPiprazole (ABILIFY) tablet 5 mg  5 mg Oral Once [name], MD      . citalopram (CELEXA) tablet 20 mg  20 mg Oral Daily [name], [name], MD   20 mg at 10/23/20 0800  . feeding supplement (ENSURE ENLIVE / ENSURE PLUS) liquid 237 mL  237 mL Oral BID BM [name], MD   237 mL at  10/23/20 1011  . guaiFENesin (ROBITUSSIN) 100 MG/5ML solution 200 mg  200 mg Oral Q4H PRN [name], MD   200 mg at 10/23/20 0800  . hydrOXYzine (ATARAX/VISTARIL) tablet 25 mg  25 mg Oral TID PRN [name], [name], MD   25 mg at 10/20/20 1352  . magnesium hydroxide (MILK OF MAGNESIA) suspension 30 mL  30 mL Oral Daily PRN [name], [name], MD      . multivitamin with minerals tablet 1 tablet  1 tablet Oral Daily [name], MD   1 tablet at 10/23/20 0800  . naltrexone (DEPADE) tablet 50 mg  50 mg Oral Daily [name], MD   50 mg at 10/23/20 0816  . nicotine (NICODERM CQ - dosed in mg/24 hours) patch 21 mg  21 mg Transdermal Daily [name], [name], MD   21 mg at 10/23/20 0803  . pneumococcal 23 valent vaccine (PNEUMOVAX-23) injection 0.5 mL  0.5 mL Intramuscular Tomorrow-1000 [name], MD      . traZODone (DESYREL) tablet 50 mg  50 mg Oral QHS PRN [name], [name], MD   50 mg at 10/22/20 2127    Lab Results:  No results found for this or any previous visit (from the past 48 hour(s)).  Blood Alcohol level:  Lab Results  Component Value Date   ETH <10 10/18/2020    Metabolic Disorder Labs: Lab Results  Component Value Date   HGBA1C 5.6 10/20/2020   MPG 114.02 10/20/2020   No results found for: PROLACTIN Lab Results  Component Value Date   CHOL 184 10/20/2020   TRIG 143 10/20/2020   HDL 51 10/20/2020   CHOLHDL 3.6 10/20/2020   VLDL 29 10/20/2020   LDLCALC 104 (H) 10/20/2020    Physical Findings: AIMS: Facial and Oral Movements Muscles of Facial Expression: None, normal Lips and Perioral Area: None, normal Jaw: None, normal Tongue: None, normal,Extremity Movements Upper (arms, wrists, hands, fingers): None, normal Lower (legs, knees, ankles, toes): None, normal, Trunk Movements Neck, shoulders, hips: None, normal, Overall Severity Severity of abnormal movements (highest score from questions above): None, normal Incapacitation due to abnormal movements:  None, normal Patient's awareness of abnormal movements (rate only patient's report): No Awareness, Dental Status Current problems with teeth and/or dentures?: No Does patient usually wear dentures?: No  CIWA:  CIWA-Ar Total: 4 COWS:  COWS Total Score: 1  Musculoskeletal: Strength & Muscle Tone: within normal limits Gait & Station: normal Patient leans: N/A  Psychiatric Specialty Exam:  Presentation  General Appearance: Casual  Eye Contact:Fair  Speech:Normal Rate  Speech Volume:Normal  Handedness:Right   Mood and Affect  Mood:Depressed; Anxious  Affect:Congruent; Tearful   Thought Process  Thought Processes:Coherent  Descriptions of Associations:Intact  Orientation:Full (Time, Place and Person)  Thought Content: Delusions, illogical History of Schizophrenia/Schizoaffective disorder:No  Duration of Psychotic Symptoms:Less than six months  Hallucinations:Audiotry hallucinations of whispers Ideas of Reference:None  Suicidal Thoughts: Yes, suicidal ideations with plan to OD, no access to means here in the hospital  Homicidal Thoughts:Denies  Sensorium  Memory:Immediate Fair; Recent Fair; Remote Fair  Judgment:Intact  Insight:Present   Executive Functions  Concentration:Fair  Attention Span:Fair  Recall:Fair  Fund of Knowledge:Fair  Language:Fair   Psychomotor Activity  Psychomotor Activity:Decreased  Assets  Assets:Communication Skills; Desire for Improvement; Physical Health; Resilience   Sleep  Sleep:Fair, 6.75 hours   Physical Exam: Physical Exam  ROS  Blood pressure 122/82, pulse 97, temperature 98.6 F (37 C), temperature source Oral, resp. rate 18, height 5\' 4"  (1.626 m), weight 47.6 kg, SpO2 98 %. Body mass index is 18.02 kg/m.   Treatment Plan Summary: Daily contact with patient to assess and evaluate symptoms and progress in treatment and Medication management Patient presenting with MDD, recurrent, severe with psychotic  features. She continues to endorse suicidal ideations, but is able to contract for safety in the hospital. Now with delusions that she has committed a crime, and calling 911 to try and turn herself in. Continue Celexa 20 mg daily,  Increase Abilify 15 mg daily  for auditory hallucinations and mood. Nicoderm patch for tobacco use disorder. Naltrexone for alcohol use disorder.   10/23/20: Psychiatric exam above reviewed and remains accurate. Assessment and plan above reviewed and updated.     10/25/20, MD 10/23/2020, 11:21 AM

## 2020-10-23 NOTE — Progress Notes (Signed)
Patient alert and oriented x 4, no distress noted, she denies SI/HI/AVH, interacting appropriately with peers and staff, 15 minutes safety checks maintained,

## 2020-10-23 NOTE — Progress Notes (Signed)
[facility] Group Notes: (Clinical Social Work)   10/23/2020      Type of Therapy:  Group Therapy   Participation Level:  Did Not Attend - was invited individually by Nurse/MHT and chose not to attend.   Amariona Rathje, LCSWA 10/23/2020  2:06 PM      

## 2020-10-24 DIAGNOSIS — F333 Major depressive disorder, recurrent, severe with psychotic symptoms: Secondary | ICD-10-CM | POA: Diagnosis not present

## 2020-10-24 MED ORDER — LORAZEPAM 2 MG PO TABS: 2.0000 mg | ORAL_TABLET | Freq: Once | ORAL | Status: AC

## 2020-10-24 MED ORDER — LORAZEPAM 1 MG PO TABS
1.0000 mg | ORAL_TABLET | Freq: Three times a day (TID) | ORAL | Status: DC
  Administered 2020-10-25 – 2020-10-27 (×7): 1 mg via ORAL
  Filled 2020-10-24 (×7): qty 1

## 2020-10-24 MED ORDER — THIAMINE HCL 100 MG PO TABS
100.0000 mg | ORAL_TABLET | Freq: Every day | ORAL | Status: DC
  Administered 2020-10-25 – 2020-11-15 (×22): 100 mg via ORAL
  Filled 2020-10-24 (×22): qty 1

## 2020-10-24 MED ORDER — RISPERIDONE 1 MG PO TBDP
2.0000 mg | ORAL_TABLET | Freq: Every day | ORAL | Status: DC
  Administered 2020-10-24 – 2020-10-25 (×2): 2 mg via ORAL
  Filled 2020-10-24 (×2): qty 2

## 2020-10-24 MED ORDER — LORAZEPAM 2 MG/ML IJ SOLN
2.0000 mg | Freq: Once | INTRAMUSCULAR | Status: AC
  Administered 2020-10-24: 2 mg via INTRAMUSCULAR
  Filled 2020-10-24: qty 1

## 2020-10-24 NOTE — Plan of Care (Signed)
°  Problem: Education: °Goal: Ability to state activities that reduce stress will improve °Outcome: Not Progressing °  °Problem: Coping: °Goal: Ability to identify and develop effective coping behavior will improve °Outcome: Not Progressing °  °Problem: Self-Concept: °Goal: Ability to identify factors that promote anxiety will improve °Outcome: Not Progressing °Goal: Level of anxiety will decrease °Outcome: Not Progressing °Goal: Ability to modify response to factors that promote anxiety will improve °Outcome: Not Progressing °  °Problem: Education: °Goal: Utilization of techniques to improve thought processes will improve °Outcome: Not Progressing °Goal: Knowledge of the prescribed therapeutic regimen will improve °Outcome: Not Progressing °  °Problem: Activity: °Goal: Interest or engagement in leisure activities will improve °Outcome: Not Progressing °Goal: Imbalance in normal sleep/wake cycle will improve °Outcome: Not Progressing °  °Problem: Coping: °Goal: Coping ability will improve °Outcome: Not Progressing °Goal: Will verbalize feelings °Outcome: Not Progressing °  °Problem: Health Behavior/Discharge Planning: °Goal: Ability to make decisions will improve °Outcome: Not Progressing °Goal: Compliance with therapeutic regimen will improve °Outcome: Not Progressing °  °Problem: Role Relationship: °Goal: Will demonstrate positive changes in social behaviors and relationships °Outcome: Not Progressing °  °Problem: Safety: °Goal: Ability to disclose and discuss suicidal ideas will improve °Outcome: Not Progressing °Goal: Ability to identify and utilize support systems that promote safety will improve °Outcome: Not Progressing °  °Problem: Self-Concept: °Goal: Will verbalize positive feelings about self °Outcome: Not Progressing °Goal: Level of anxiety will decrease °Outcome: Not Progressing °  °

## 2020-10-24 NOTE — Progress Notes (Signed)
[facility] MD Progress Note  10/24/2020 11:32 AM Cristina Finley  MRN:  [id number] CC "I'm under arrest  Subjective:  45 year old female with history of depression presenting for worsening mood and suicidal ideations. Non compliant with medications this morning, ADLs impaired.   This morning patient observed standing with arms up in the air, and she sates she is under arrest. Apparently, this has been occurring for almost an hour prior to arrival. She is unwilling to put her arms down, and does not believe me when I tell her she is not under arrest. Symptoms concerning for catatonia in addition to psychosis. Ativan 2 mg IM administered IM, and patient able to return to bed. She does admit to some medication noncompliance, suicidal ideations, and auditory hallucinations of voices whispering to her. Denies homicidal ideations or visual hallucinations. Concern that patient may be spitting out Abilify tablets due to worsening psychosis. Will transition to risperdal M-tabs 2 mg QHS. Also scheduling Ativan 1 mg PO TID for catatonia.   Principal Problem: MDD (major depressive disorder), recurrent, severe, with psychosis (HCC) Diagnosis: Principal Problem:   MDD (major depressive disorder), recurrent, severe, with psychosis (HCC) Active Problems:   Alcohol abuse   Nicotine abuse  Total Time spent with patient: 30 minutes  Past Psychiatric History: See H&P  Past Medical History: History reviewed. No pertinent past medical history. History reviewed. No pertinent surgical history. Family History: History reviewed. No pertinent family history. Family Psychiatric  History: See H&P Social History:  Social History   Substance and Sexual Activity  Alcohol Use Yes  . Alcohol/week: 6.0 standard drinks  . Types: 6 Cans of beer per week   Comment: one 6 pack daily     Social History   Substance and Sexual Activity  Drug Use Yes  . Types: Marijuana   Comment: last used 2 days ago per pt    Social History    Socioeconomic History  . Marital status: Married    Spouse name: Not on file  . Number of children: Not on file  . Years of education: Not on file  . Highest education level: Not on file  Occupational History  . Not on file  Tobacco Use  . Smoking status: Current Every Day Smoker    Packs/day: 1.50    Types: Cigarettes  . Smokeless tobacco: Never Used  Vaping Use  . Vaping Use: Never used  Substance and Sexual Activity  . Alcohol use: Yes    Alcohol/week: 6.0 standard drinks    Types: 6 Cans of beer per week    Comment: one 6 pack daily  . Drug use: Yes    Types: Marijuana    Comment: last used 2 days ago per pt  . Sexual activity: Yes    Partners: Male    Birth control/protection: Surgical  Other Topics Concern  . Not on file  Social History Narrative  . Not on file   Social Determinants of Health   Financial Resource Strain: Not on file  Food Insecurity: Not on file  Transportation Needs: Not on file  Physical Activity: Not on file  Stress: Not on file  Social Connections: Not on file   Additional Social History:     Sleep: Fair  Appetite:  Poor  Current Medications: Current Facility-Administered Medications  Medication Dose Route Frequency Provider Last Rate Last Admin  . acetaminophen (TYLENOL) tablet 650 mg  650 mg Oral Q6H PRN [name], [name], MD      . alum & mag  hydroxide-simeth (MAALOX/MYLANTA) 200-200-20 MG/5ML suspension 30 mL  30 mL Oral Q4H PRN [name], [name], MD      . citalopram (CELEXA) tablet 20 mg  20 mg Oral Daily [name], [name], MD   20 mg at 10/23/20 0800  . feeding supplement (ENSURE ENLIVE / ENSURE PLUS) liquid 237 mL  237 mL Oral BID BM [name], MD   237 mL at 10/24/20 1007  . guaiFENesin (ROBITUSSIN) 100 MG/5ML solution 200 mg  200 mg Oral Q4H PRN [name], MD   200 mg at 10/23/20 0800  . hydrOXYzine (ATARAX/VISTARIL) tablet 25 mg  25 mg Oral TID PRN [name], [name], MD   25 mg at 10/20/20 1352  . LORazepam  (ATIVAN) tablet 1 mg  1 mg Oral TID [name], MD      . magnesium hydroxide (MILK OF MAGNESIA) suspension 30 mL  30 mL Oral Daily PRN [name], [name], MD      . multivitamin with minerals tablet 1 tablet  1 tablet Oral Daily [name], MD   1 tablet at 10/23/20 0800  . naltrexone (DEPADE) tablet 50 mg  50 mg Oral Daily [name], MD   50 mg at 10/23/20 0816  . nicotine (NICODERM CQ - dosed in mg/24 hours) patch 21 mg  21 mg Transdermal Daily [name], [name], MD   21 mg at 10/24/20 5732  . pneumococcal 23 valent vaccine (PNEUMOVAX-23) injection 0.5 mL  0.5 mL Intramuscular Tomorrow-1000 [name], MD      . risperiDONE (RISPERDAL M-TABS) disintegrating tablet 2 mg  2 mg Oral QHS [name], MD      . Melene Muller ON 10/25/2020] thiamine tablet 100 mg  100 mg Oral Daily [name], MD      . traZODone (DESYREL) tablet 50 mg  50 mg Oral QHS PRN [name], [name], MD   50 mg at 10/23/20 2202    Lab Results:  No results found for this or any previous visit (from the past 48 hour(s)).  Blood Alcohol level:  Lab Results  Component Value Date   ETH <10 10/18/2020    Metabolic Disorder Labs: Lab Results  Component Value Date   HGBA1C 5.6 10/20/2020   MPG 114.02 10/20/2020   No results found for: PROLACTIN Lab Results  Component Value Date   CHOL 184 10/20/2020   TRIG 143 10/20/2020   HDL 51 10/20/2020   CHOLHDL 3.6 10/20/2020   VLDL 29 10/20/2020   LDLCALC 104 (H) 10/20/2020    Physical Findings: AIMS: Facial and Oral Movements Muscles of Facial Expression: None, normal Lips and Perioral Area: None, normal Jaw: None, normal Tongue: None, normal,Extremity Movements Upper (arms, wrists, hands, fingers): None, normal Lower (legs, knees, ankles, toes): None, normal, Trunk Movements Neck, shoulders, hips: None, normal, Overall Severity Severity of abnormal movements (highest score from questions above): None, normal Incapacitation due to abnormal  movements: None, normal Patient's awareness of abnormal movements (rate only patient's report): No Awareness, Dental Status Current problems with teeth and/or dentures?: No Does patient usually wear dentures?: No  CIWA:  CIWA-Ar Total: 4 COWS:  COWS Total Score: 1  Musculoskeletal: Strength & Muscle Tone: within normal limits Gait & Station: normal Patient leans: N/A  Psychiatric Specialty Exam:  Presentation  General Appearance: Disheveled  Eye Contact:Minimal  Speech:Normal Rate  Speech Volume:Normal  Handedness:Right   Mood and Affect  Mood:Anxious  Affect:Constricted   Thought Process  Thought Processes:Goal Directed  Descriptions of Associations:Loose  Orientation:Partial  Thought Content: Delusions, illogical History of Schizophrenia/Schizoaffective disorder:No  Duration of Psychotic Symptoms:Less than six months  Hallucinations:Audiotry hallucinations of whispers Ideas of Reference:None  Suicidal Thoughts: Yes, suicidal ideations with plan to OD, no access to means here in the hospital  Homicidal Thoughts:Denies  Sensorium  Memory:Immediate Poor; Recent Fair; Remote Fair  Judgment:Impaired  Insight:Lacking   Executive Functions  Concentration:Poor  Attention Span:Poor  Recall:Poor  Fund of Knowledge:Fair  Language:Fair   Psychomotor Activity  Psychomotor Activity:Decreased  Assets  Assets:Desire for Improvement; Physical Health   Sleep  Sleep:Fair   Physical Exam: Physical Exam  ROS  Blood pressure 122/82, pulse 97, temperature 98.6 F (37 C), temperature source Oral, resp. rate 18, height 5\' 4"  (1.626 m), weight 47.6 kg, SpO2 98 %. Body mass index is 18.02 kg/m.   Treatment Plan Summary: Daily contact with patient to assess and evaluate symptoms and progress in treatment and Medication management Patient presenting with MDD, recurrent, severe with psychotic features. She continues to endorse suicidal ideations, but is  able to contract for safety in the hospital. Now with delusions that she has committed a crime, and calling 911 to try and turn herself in. Standing with arms in air for almost an hour concerning for catatonia. Also concerned that patient may be cheeking and spitting out medications. Continue Celexa 20 mg daily, discontinue Abilify. Start Risperdal M-tabs 2 mg QHS, start Ativan 1 mg PO TID for catatonia. Nicoderm patch for tobacco use disorder. Naltrexone for alcohol use disorder.   10/24/20: Psychiatric exam above reviewed and remains accurate. Assessment and plan above reviewed and updated.      10/26/20, MD 10/24/2020, 11:32 AM

## 2020-10-24 NOTE — Plan of Care (Signed)
  Problem: Self-Concept: Goal: Ability to identify factors that promote anxiety will improve Outcome: Progressing Goal: Level of anxiety will decrease Outcome: Progressing Goal: Ability to modify response to factors that promote anxiety will improve Outcome: Progressing   Problem: Coping: Goal: Coping ability will improve Outcome: Progressing Goal: Will verbalize feelings Outcome: Progressing   Problem: Coping: Goal: Coping ability will improve Outcome: Progressing Goal: Will verbalize feelings Outcome: Progressing

## 2020-10-24 NOTE — Progress Notes (Signed)
[facility] Group Notes: (Clinical Social Work)   10/24/2020      Type of Therapy:  Group Therapy   Participation Level:  Did Not Attend - was invited individually by Nurse/MHT and chose not to attend.   Susa Simmonds, LCSWA 10/24/2020  1:56 PM

## 2020-10-24 NOTE — Progress Notes (Signed)
Patient refused her morning medications, except for her nicotine patch. She had been standing in her room with her arms in the air all morning, saying "I can't move, I'm under arrest". Ativan 2 mg IM given per order. Patient refused injection at first, but then complied and accepted the injection in her left hip. She continued to stand with her arms in the air, but put them down briefly to accept her Ensure. Patient is currently lying in bed

## 2020-10-24 NOTE — Progress Notes (Signed)
Patient has been isolative to her room.  She has been sleep or in bed since start of the shift. She has made minimal contact with staff and did not come out for snack or the PRN  Medication that she normally takes each night. She continues to be under 15 minute safety checks and was encourage to seek help from nurses staff if needed.   Cristina Butler-Nicholson, LPN

## 2020-10-25 DIAGNOSIS — F333 Major depressive disorder, recurrent, severe with psychotic symptoms: Secondary | ICD-10-CM | POA: Diagnosis not present

## 2020-10-25 NOTE — Plan of Care (Signed)
Patient isolative and minimal with staff and peers on the unit.   Problem: Health Behavior/Discharge Planning: Goal: Compliance with therapeutic regimen will improve Outcome: Progressing

## 2020-10-25 NOTE — Plan of Care (Signed)
  Problem: Coping: Goal: Ability to identify and develop effective coping behavior will improve Outcome: Not Progressing   Problem: Self-Concept: Goal: Ability to identify factors that promote anxiety will improve Outcome: Not Progressing Goal: Level of anxiety will decrease Outcome: Not Progressing Goal: Ability to modify response to factors that promote anxiety will improve Outcome: Not Progressing   Problem: Education: Goal: Utilization of techniques to improve thought processes will improve Outcome: Not Progressing Goal: Knowledge of the prescribed therapeutic regimen will improve Outcome: Not Progressing

## 2020-10-25 NOTE — Progress Notes (Signed)
Patient presents with a bizarre affect. Patient isolative to her room and responding to internal stimuli. Patient compliant with medication administration per MD orders. Patient given education, support, and encouragement to be active in her treatment plan. Patient being monitored Q 15 minutes for safety per unit protocol. Pt remains safe on the unit.

## 2020-10-25 NOTE — [facility] Group Notes (Signed)
LCSW Group Therapy Note     10/25/2020 2:10 PM     Type of Therapy and Topic:  Group Therapy:  Overcoming Obstacles     Participation Level:  Did Not Attend     Description of Group:     In this group patients will be encouraged to explore what they see as obstacles to their own wellness and recovery. They will be guided to discuss their thoughts, feelings, and behaviors related to these obstacles. The group will process together ways to cope with barriers, with attention given to specific choices patients can make. Each patient will be challenged to identify changes they are motivated to make in order to overcome their obstacles. This group will be process-oriented, with patients participating in exploration of their own experiences as well as giving and receiving support and challenge from other group members.     Therapeutic Goals:  1.    Patient will identify personal and current obstacles as they relate to admission.  2.    Patient will identify barriers that currently interfere with their wellness or overcoming obstacles.  3.    Patient will identify feelings, thought process and behaviors related to these barriers.  4.    Patient will identify two changes they are willing to make to overcome these obstacles:        Summary of Patient Progress:  X   Therapeutic Modalities:    Cognitive Behavioral Therapy  Solution Focused Therapy  Motivational Interviewing  Relapse Prevention Therapy     Donabelle Molden [place], MSW, LCSW-A  10/25/2020 2:10 PM

## 2020-10-25 NOTE — Tx Team (Signed)
Interdisciplinary Treatment and Diagnostic Plan Update  10/25/2020 Time of Session: 8:30AM Cristina Finley MRN: [id number]  Principal Diagnosis: MDD (major depressive disorder), recurrent, severe, with psychosis (HCC)  Secondary Diagnoses: Principal Problem:   MDD (major depressive disorder), recurrent, severe, with psychosis (HCC) Active Problems:   Alcohol abuse   Nicotine abuse   Current Medications:  Current Facility-Administered Medications  Medication Dose Route Frequency Provider Last Rate Last Admin  . acetaminophen (TYLENOL) tablet 650 mg  650 mg Oral Q6H PRN [name], [name], MD      . alum & mag hydroxide-simeth (MAALOX/MYLANTA) 200-200-20 MG/5ML suspension 30 mL  30 mL Oral Q4H PRN [name], [name], MD      . citalopram (CELEXA) tablet 20 mg  20 mg Oral Daily [name], [name], MD   20 mg at 10/25/20 0816  . feeding supplement (ENSURE ENLIVE / ENSURE PLUS) liquid 237 mL  237 mL Oral BID BM [name], MD   237 mL at 10/25/20 0932  . guaiFENesin (ROBITUSSIN) 100 MG/5ML solution 200 mg  200 mg Oral Q4H PRN [name], MD   200 mg at 10/23/20 0800  . hydrOXYzine (ATARAX/VISTARIL) tablet 25 mg  25 mg Oral TID PRN [name], [name], MD   25 mg at 10/20/20 1352  . LORazepam (ATIVAN) tablet 1 mg  1 mg Oral TID [name], MD   1 mg at 10/25/20 0816  . magnesium hydroxide (MILK OF MAGNESIA) suspension 30 mL  30 mL Oral Daily PRN [name], [name], MD      . multivitamin with minerals tablet 1 tablet  1 tablet Oral Daily [name], MD   1 tablet at 10/25/20 0816  . naltrexone (DEPADE) tablet 50 mg  50 mg Oral Daily [name], MD   50 mg at 10/25/20 0816  . nicotine (NICODERM CQ - dosed in mg/24 hours) patch 21 mg  21 mg Transdermal Daily [name], [name], MD   21 mg at 10/25/20 0818  . pneumococcal 23 valent vaccine (PNEUMOVAX-23) injection 0.5 mL  0.5 mL Intramuscular Tomorrow-1000 [name], MD      . risperiDONE (RISPERDAL M-TABS) disintegrating tablet  2 mg  2 mg Oral QHS [name], MD   2 mg at 10/24/20 2126  . thiamine tablet 100 mg  100 mg Oral Daily [name], MD   100 mg at 10/25/20 0816  . traZODone (DESYREL) tablet 50 mg  50 mg Oral QHS PRN [name], [name], MD   50 mg at 10/23/20 2202   PTA Medications: Medications Prior to Admission  Medication Sig Dispense Refill Last Dose  . citalopram (CELEXA) 20 MG tablet Take 20 mg by mouth daily.       Patient Stressors: Marital or family conflict Substance abuse  Patient Strengths: Barrister's clerk for treatment/growth  Treatment Modalities: Medication Management, Group therapy, Case management,  1 to 1 session with clinician, Psychoeducation, Recreational therapy.   Physician Treatment Plan for Primary Diagnosis: MDD (major depressive disorder), recurrent, severe, with psychosis (HCC) Long Term Goal(s): Improvement in symptoms so as ready for discharge Improvement in symptoms so as ready for discharge   Short Term Goals: Ability to identify changes in lifestyle to reduce recurrence of condition will improve Ability to verbalize feelings will improve Ability to disclose and discuss suicidal ideas Ability to demonstrate self-control will improve Ability to identify and develop effective coping behaviors will improve Ability to maintain clinical measurements within normal limits will  improve Compliance with prescribed medications will improve Ability to identify triggers associated with substance abuse/mental health issues will improve Ability to identify changes in lifestyle to reduce recurrence of condition will improve Ability to verbalize feelings will improve Ability to disclose and discuss suicidal ideas Ability to demonstrate self-control will improve Ability to identify and develop effective coping behaviors will improve Ability to maintain clinical measurements within normal limits will improve Compliance with prescribed medications will  improve Ability to identify triggers associated with substance abuse/mental health issues will improve  Medication Management: Evaluate patient's response, side effects, and tolerance of medication regimen.  Therapeutic Interventions: 1 to 1 sessions, Unit Group sessions and Medication administration.  Evaluation of Outcomes: Progressing  Physician Treatment Plan for Secondary Diagnosis: Principal Problem:   MDD (major depressive disorder), recurrent, severe, with psychosis (HCC) Active Problems:   Alcohol abuse   Nicotine abuse  Long Term Goal(s): Improvement in symptoms so as ready for discharge Improvement in symptoms so as ready for discharge   Short Term Goals: Ability to identify changes in lifestyle to reduce recurrence of condition will improve Ability to verbalize feelings will improve Ability to disclose and discuss suicidal ideas Ability to demonstrate self-control will improve Ability to identify and develop effective coping behaviors will improve Ability to maintain clinical measurements within normal limits will improve Compliance with prescribed medications will improve Ability to identify triggers associated with substance abuse/mental health issues will improve Ability to identify changes in lifestyle to reduce recurrence of condition will improve Ability to verbalize feelings will improve Ability to disclose and discuss suicidal ideas Ability to demonstrate self-control will improve Ability to identify and develop effective coping behaviors will improve Ability to maintain clinical measurements within normal limits will improve Compliance with prescribed medications will improve Ability to identify triggers associated with substance abuse/mental health issues will improve     Medication Management: Evaluate patient's response, side effects, and tolerance of medication regimen.  Therapeutic Interventions: 1 to 1 sessions, Unit Group sessions and Medication  administration.  Evaluation of Outcomes: Progressing   RN Treatment Plan for Primary Diagnosis: MDD (major depressive disorder), recurrent, severe, with psychosis (HCC) Long Term Goal(s): Knowledge of disease and therapeutic regimen to maintain health will improve  Short Term Goals: Ability to remain free from injury will improve, Ability to demonstrate self-control, Ability to participate in decision making will improve, Ability to verbalize feelings will improve, Ability to disclose and discuss suicidal ideas, Ability to identify and develop effective coping behaviors will improve and Compliance with prescribed medications will improve  Medication Management: RN will administer medications as ordered by provider, will assess and evaluate patient's response and provide education to patient for prescribed medication. RN will report any adverse and/or side effects to prescribing provider.  Therapeutic Interventions: 1 on 1 counseling sessions, Psychoeducation, Medication administration, Evaluate responses to treatment, Monitor vital signs and CBGs as ordered, Perform/monitor CIWA, COWS, AIMS and Fall Risk screenings as ordered, Perform wound care treatments as ordered.  Evaluation of Outcomes: Progressing   LCSW Treatment Plan for Primary Diagnosis: MDD (major depressive disorder), recurrent, severe, with psychosis (HCC) Long Term Goal(s): Safe transition to appropriate next level of care at discharge, Engage patient in therapeutic group addressing interpersonal concerns.  Short Term Goals: Engage patient in aftercare planning with referrals and resources, Increase social support, Increase ability to appropriately verbalize feelings, Increase emotional regulation, Facilitate acceptance of mental health diagnosis and concerns, Facilitate patient progression through stages of change regarding substance use diagnoses and concerns, Identify  triggers associated with mental health/substance abuse issues  and Increase skills for wellness and recovery  Therapeutic Interventions: Assess for all discharge needs, 1 to 1 time with Social worker, Explore available resources and support systems, Assess for adequacy in community support network, Educate family and significant other(s) on suicide prevention, Complete Psychosocial Assessment, Interpersonal group therapy.  Evaluation of Outcomes: Progressing   Progress in Treatment: Attending groups: Yes. Participating in groups: Yes. Taking medication as prescribed: No. Toleration medication: Yes. Family/Significant other contact made: No, will contact:  when given permission. Patient understands diagnosis: Yes. Discussing patient identified problems/goals with staff: Yes. Medical problems stabilized or resolved: Yes. Denies suicidal/homicidal ideation: Yes. Issues/concerns per patient self-inventory: No. Other: None.  New problem(s) identified: No, Describe:  none.  New Short Term/Long Term Goal(s): detox, elimination of symptoms of psychosis, medication management for mood stabilization; elimination of SI thoughts; development of comprehensive mental wellness/sobriety plan. Update 10/25/20: No changes at this time.  Patient Goals: "I just want to get these thoughts out of my head." Update 10/25/20: No changes at this time.  Discharge Plan or Barriers: CSW will assist pt with development of an appropriate aftercare//discharge plan. Pt to be given information regarding residential/inpt substance use treatment. Update 10/25/20: No changes at this time.  Reason for Continuation of Hospitalization: Depression Hallucinations Medication stabilization Withdrawal symptoms  Estimated Length of Stay: TBD  Attendees: Patient:  10/25/2020 10:40 AM  Physician: Les Pou, MD 10/25/2020 10:40 AM  Nursing:  10/25/2020 10:40 AM  RN Care Manager: 10/25/2020 10:40 AM  Social Worker: Vilma Meckel. Algis Greenhouse, MSW, LCSW, LCAS 10/25/2020 10:40 AM  Recreational Therapist:   10/25/2020 10:40 AM  Other: Kiva [place], MSW, LCSW-A 10/25/2020 10:40 AM  Other: Gwenevere Ghazi, MSW, [place], LCASA 10/25/2020 10:40 AM  Other: 10/25/2020 10:40 AM    Scribe for Treatment Team: Glenis Smoker, LCSW 10/25/2020 10:40 AM

## 2020-10-25 NOTE — Progress Notes (Signed)
Approached patient in her room to inquire if she was going to have evening snack.  Patient standing in her room with her arms up in the air. She declined snack and stated that she was not sure if she would be allowed to leave the room because she was under arrest. Encouraged her to come and get snack and assured her that she was not under arrest. Attempted to ground patient inquiring if she was aware of where she was at.  She did acknowledge that she was inpatient at the hospital, but continued to stand in the room with her hands in the air. She will continue to be monitored with 15 minute safety rounds and was encouraged to come to staff if she needed any assistance.      Cleo Butler-Nicholson, LPN

## 2020-10-25 NOTE — Progress Notes (Signed)
[facility] MD Progress Note  10/25/2020 1:57 PM Cristina Finley  MRN:  [id number] CC "Not good."  Subjective:  45 year old female with history of depression presenting for worsening mood and suicidal ideations.No acute events overnight, medication compliant with sustained effort from nursing,  ADLs impaired.   This morning patient was observed standing withher arms in the arm again. She did take Ativan orally with continued prompting from nurse. Afterward she did put her arms down, and speech became somewhat clearer. This appears to be catatonia in addition to psychotic depression. She continues to endorse auditory hallucinations, but will not divulge what the voices are saying only that it is bad. She remains convinced that she did something bad as well. She continues to have suicidal ideations, but contracts for safety. Denies homicidal ideations, visual hallucinations.    Principal Problem: MDD (major depressive disorder), recurrent, severe, with psychosis (HCC) Diagnosis: Principal Problem:   MDD (major depressive disorder), recurrent, severe, with psychosis (HCC) Active Problems:   Alcohol abuse   Nicotine abuse  Total Time spent with patient: 30 minutes  Past Psychiatric History: See H&P  Past Medical History: History reviewed. No pertinent past medical history. History reviewed. No pertinent surgical history. Family History: History reviewed. No pertinent family history. Family Psychiatric  History: See H&P Social History:  Social History   Substance and Sexual Activity  Alcohol Use Yes  . Alcohol/week: 6.0 standard drinks  . Types: 6 Cans of beer per week   Comment: one 6 pack daily     Social History   Substance and Sexual Activity  Drug Use Yes  . Types: Marijuana   Comment: last used 2 days ago per pt    Social History   Socioeconomic History  . Marital status: Married    Spouse name: Not on file  . Number of children: Not on file  . Years of education: Not on file   . Highest education level: Not on file  Occupational History  . Not on file  Tobacco Use  . Smoking status: Current Every Day Smoker    Packs/day: 1.50    Types: Cigarettes  . Smokeless tobacco: Never Used  Vaping Use  . Vaping Use: Never used  Substance and Sexual Activity  . Alcohol use: Yes    Alcohol/week: 6.0 standard drinks    Types: 6 Cans of beer per week    Comment: one 6 pack daily  . Drug use: Yes    Types: Marijuana    Comment: last used 2 days ago per pt  . Sexual activity: Yes    Partners: Male    Birth control/protection: Surgical  Other Topics Concern  . Not on file  Social History Narrative  . Not on file   Social Determinants of Health   Financial Resource Strain: Not on file  Food Insecurity: Not on file  Transportation Needs: Not on file  Physical Activity: Not on file  Stress: Not on file  Social Connections: Not on file   Additional Social History:     Sleep: Fair  Appetite:  Poor  Current Medications: Current Facility-Administered Medications  Medication Dose Route Frequency Provider Last Rate Last Admin  . acetaminophen (TYLENOL) tablet 650 mg  650 mg Oral Q6H PRN [name], [name], MD      . alum & mag hydroxide-simeth (MAALOX/MYLANTA) 200-200-20 MG/5ML suspension 30 mL  30 mL Oral Q4H PRN [name], [name], MD      . citalopram (CELEXA) tablet 20 mg  20 mg Oral  Daily [name], [name], MD   20 mg at 10/25/20 0816  . feeding supplement (ENSURE ENLIVE / ENSURE PLUS) liquid 237 mL  237 mL Oral BID BM [name], MD   237 mL at 10/25/20 0932  . guaiFENesin (ROBITUSSIN) 100 MG/5ML solution 200 mg  200 mg Oral Q4H PRN [name], MD   200 mg at 10/23/20 0800  . hydrOXYzine (ATARAX/VISTARIL) tablet 25 mg  25 mg Oral TID PRN [name], [name], MD   25 mg at 10/20/20 1352  . LORazepam (ATIVAN) tablet 1 mg  1 mg Oral TID [name], MD   1 mg at 10/25/20 1145  . magnesium hydroxide (MILK OF MAGNESIA) suspension 30 mL  30 mL Oral Daily  PRN [name], [name], MD      . multivitamin with minerals tablet 1 tablet  1 tablet Oral Daily [name], MD   1 tablet at 10/25/20 0816  . naltrexone (DEPADE) tablet 50 mg  50 mg Oral Daily [name], MD   50 mg at 10/25/20 0816  . nicotine (NICODERM CQ - dosed in mg/24 hours) patch 21 mg  21 mg Transdermal Daily [name], [name], MD   21 mg at 10/25/20 0818  . pneumococcal 23 valent vaccine (PNEUMOVAX-23) injection 0.5 mL  0.5 mL Intramuscular Tomorrow-1000 [name], MD      . risperiDONE (RISPERDAL M-TABS) disintegrating tablet 2 mg  2 mg Oral QHS [name], MD   2 mg at 10/24/20 2126  . thiamine tablet 100 mg  100 mg Oral Daily [name], MD   100 mg at 10/25/20 0816  . traZODone (DESYREL) tablet 50 mg  50 mg Oral QHS PRN [name], [name], MD   50 mg at 10/23/20 2202    Lab Results:  No results found for this or any previous visit (from the past 48 hour(s)).  Blood Alcohol level:  Lab Results  Component Value Date   ETH <10 10/18/2020    Metabolic Disorder Labs: Lab Results  Component Value Date   HGBA1C 5.6 10/20/2020   MPG 114.02 10/20/2020   No results found for: PROLACTIN Lab Results  Component Value Date   CHOL 184 10/20/2020   TRIG 143 10/20/2020   HDL 51 10/20/2020   CHOLHDL 3.6 10/20/2020   VLDL 29 10/20/2020   LDLCALC 104 (H) 10/20/2020    Physical Findings: AIMS: Facial and Oral Movements Muscles of Facial Expression: None, normal Lips and Perioral Area: None, normal Jaw: None, normal Tongue: None, normal,Extremity Movements Upper (arms, wrists, hands, fingers): None, normal Lower (legs, knees, ankles, toes): None, normal, Trunk Movements Neck, shoulders, hips: None, normal, Overall Severity Severity of abnormal movements (highest score from questions above): None, normal Incapacitation due to abnormal movements: None, normal Patient's awareness of abnormal movements (rate only patient's report): No Awareness, Dental  Status Current problems with teeth and/or dentures?: No Does patient usually wear dentures?: No  CIWA:  CIWA-Ar Total: 4 COWS:  COWS Total Score: 1  Musculoskeletal: Strength & Muscle Tone: within normal limits Gait & Station: normal Patient leans: N/A  Psychiatric Specialty Exam:  Presentation  General Appearance: Disheveled  Eye Contact:Minimal  Speech:Normal Rate  Speech Volume:Normal  Handedness:Right   Mood and Affect  Mood:Anxious  Affect:Constricted   Thought Process  Thought Processes:Goal Directed  Descriptions of Associations:Loose  Orientation:Partial  Thought Content: Delusions, illogical History of Schizophrenia/Schizoaffective disorder:No  Duration of Psychotic Symptoms:Less than six months  Hallucinations:Audiotry hallucinations  of whispers Ideas of Reference:None  Suicidal Thoughts: Yes, suicidal ideations with plan to OD, no access to means here in the hospital  Homicidal Thoughts:Denies  Sensorium  Memory:Immediate Poor; Recent Fair; Remote Fair  Judgment:Impaired  Insight:Lacking   Executive Functions  Concentration:Poor  Attention Span:Poor  Recall:Poor  Fund of Knowledge:Fair  Language:Fair   Psychomotor Activity  Psychomotor Activity:Decreased  Assets  Assets:Desire for Improvement; Physical Health   Sleep  Sleep:Fair   Physical Exam: Physical Exam  ROS  Blood pressure (!) 139/95, pulse 79, temperature 98 F (36.7 C), temperature source Oral, resp. rate 18, height 5\' 4"  (1.626 m), weight 47.6 kg, SpO2 100 %. Body mass index is 18.02 kg/m.   Treatment Plan Summary: Daily contact with patient to assess and evaluate symptoms and progress in treatment and Medication management Patient presenting with MDD, recurrent, severe with psychotic features. She continues to endorse suicidal ideations, but is able to contract for safety in the hospital. Continued delusions that she has committed a crime. Exhibiting  signs of catatonia. Continue Celexa 20 mg daily, discontinue Abilify. Increase Risperdal M-tabs 3 mg QHS, continue Ativan 1 mg PO TID for catatonia. Nicoderm patch for tobacco use disorder. Naltrexone for alcohol use disorder.   10/25/20: Psychiatric exam above reviewed and remains accurate. Assessment and plan above reviewed and updated.    10/27/20, MD 10/25/2020, 1:57 PM

## 2020-10-25 NOTE — Progress Notes (Signed)
D: Pt alert and oriented. Pt rates depression 10/10 and anxiety 10/10. Pt denies experiencing any pain at this time. Pt denies experiencing any SI/HI, or AVH at this time, however is obviously observed responding to internal stimuli.   This morning pt initially refused to take her morning medications stating she couldn't having her medications. When asked why pt replied they said she couldn't. When asked who they were, pt stated don't play games you know. Pt was observed multiple times with arms raised in the air during the first half of the which improved with schedule medication as the day progressed. This evening pt was observed with tears rolling down her cheeks and when asked if she was okay and if there was anything this writer could do for her the pt stated I'm okay.  This afternoon while everyone was at group the pt did make contact with her family.    A: Scheduled medications administered to pt, per MD orders. Support and encouragement provided. Frequent verbal contact made. Routine safety checks conducted q15 minutes.   R: No adverse drug reactions noted. Pt verbally contracts for safety at this time. Pt complaint with medications and treatment plan. Pt interacts minimally with others on the unit, mostly staying in her room occasionally coming out for dinner once everyone else had eaten and her tray was held. Pt remains safe at this time. Will continue to monitor.

## 2020-10-25 NOTE — Progress Notes (Signed)
Recreation Therapy Notes  Date: 10/25/2020  Time: 9:30 am   Location: Craft room  Behavioral response: N/A   Intervention Topic: Time Management    Discussion/Intervention: Patient did not attend group.   Clinical Observations/Feedback:  Patient did not attend group.   Marcelline Temkin LRT/CTRS        Itati Brocksmith 10/25/2020 11:36 AM

## 2020-10-26 DIAGNOSIS — F333 Major depressive disorder, recurrent, severe with psychotic symptoms: Secondary | ICD-10-CM | POA: Diagnosis not present

## 2020-10-26 MED ORDER — RISPERIDONE 1 MG PO TBDP
1.0000 mg | ORAL_TABLET | Freq: Every day | ORAL | Status: DC
  Administered 2020-10-26 – 2020-10-28 (×3): 1 mg via ORAL
  Filled 2020-10-26 (×3): qty 1

## 2020-10-26 MED ORDER — RISPERIDONE 1 MG PO TBDP
3.0000 mg | ORAL_TABLET | Freq: Every day | ORAL | Status: DC
  Administered 2020-10-26: 3 mg via ORAL
  Filled 2020-10-26: qty 3

## 2020-10-26 NOTE — Progress Notes (Signed)
D: Pt alert and oriented. Pt denies experiencing any anxiety/depression at this time. Pt denies experiencing any pain at this time. Pt denies experiencing any SI/HI, or AVH at this time, however is observed responding to internal stimuli.   Pt self isolates to room. Has only been seen once outside of room using the phone. Pt is usually in her room either standing with her arms extending toward the ceiling, siting on her bedside facing the window with the curtains closed, or in the chair in her room. Pt's bedroom light is always off with her sitting in the dark. Pt took her medications but was very apprehensive stating what is this. When told what the medication was the pt stated I don't know that's a lot. Pt was reassured it was the right medication prescribed to her and that the dose was not very much or strong. Pt then took her medication. Pt would not come out to eat meals so her meals were taken to her in which she ate practically everything. Pt states she doesn't like to eat out there around everyone. This evening when asked if she'd like her food brought to her the pt stated I don't know, it not allowed. Pt was reassured that it was okay.  A: Scheduled medications administered to pt, per MD orders. Support and encouragement provided. Frequent verbal contact made. Routine safety checks conducted q15 minutes.   R: No adverse drug reactions noted. Pt verbally contracts for safety at this time. Pt complaint with medications. Pt interacts minimally with others on the unit, mostly isolating to her room with the lights out. Pt remains safe at this time. Will continue to monitor.

## 2020-10-26 NOTE — Progress Notes (Signed)
Recreation Therapy Notes  Date: 10/26/2020  Time: 9:30 am   Location: Court yard   Behavioral response: N/A   Intervention Topic: Leisure     Discussion/Intervention: Patient did not attend group.   Clinical Observations/Feedback:  Patient did not attend group.   Armany Mano LRT/CTRS        Talayia Hjort 10/26/2020 10:44 AM

## 2020-10-26 NOTE — Plan of Care (Signed)
  Problem: Depression Goal: STG - Patient will identify 3 positive coping skills to decrease depressive symptoms within 5 recreation therapy group sessions Description: STG - Patient will identify 3 positive coping skills to decrease depressive symptoms within 5 recreation therapy group sessions Outcome: Progressing   

## 2020-10-26 NOTE — Progress Notes (Signed)
[facility] MD Progress Note  10/26/2020 12:45 PM Cristina Finley  MRN:  [id number] CC "Not good."  Subjective:  45 year old female with history of depression presenting for worsening mood and suicidal ideations.No acute events overnight, medication compliant,  ADLs impaired.   This morning patient sitting in the dark in her room. However, she is speaking more today, and does not have her hands held high. She is somewhat irritable stating she is hungry. When asked if she ate she asks "am I allowed?" Reassured patient she can eat her food trays and drink her ensure shakes. She continues to respond to internal stimuli, but refers to them as loud thoughts telling her bad things. She continues to have depression, anxiety, and suicidal ideations. Denies homicidal ideations, or visual hallucinations.    Principal Problem: MDD (major depressive disorder), recurrent, severe, with psychosis (HCC) Diagnosis: Principal Problem:   MDD (major depressive disorder), recurrent, severe, with psychosis (HCC) Active Problems:   Alcohol abuse   Nicotine abuse  Total Time spent with patient: 30 minutes  Past Psychiatric History: See H&P  Past Medical History: History reviewed. No pertinent past medical history. History reviewed. No pertinent surgical history. Family History: History reviewed. No pertinent family history. Family Psychiatric  History: See H&P Social History:  Social History   Substance and Sexual Activity  Alcohol Use Yes  . Alcohol/week: 6.0 standard drinks  . Types: 6 Cans of beer per week   Comment: one 6 pack daily     Social History   Substance and Sexual Activity  Drug Use Yes  . Types: Marijuana   Comment: last used 2 days ago per pt    Social History   Socioeconomic History  . Marital status: Married    Spouse name: Not on file  . Number of children: Not on file  . Years of education: Not on file  . Highest education level: Not on file  Occupational History  . Not on file   Tobacco Use  . Smoking status: Current Every Day Smoker    Packs/day: 1.50    Types: Cigarettes  . Smokeless tobacco: Never Used  Vaping Use  . Vaping Use: Never used  Substance and Sexual Activity  . Alcohol use: Yes    Alcohol/week: 6.0 standard drinks    Types: 6 Cans of beer per week    Comment: one 6 pack daily  . Drug use: Yes    Types: Marijuana    Comment: last used 2 days ago per pt  . Sexual activity: Yes    Partners: Male    Birth control/protection: Surgical  Other Topics Concern  . Not on file  Social History Narrative  . Not on file   Social Determinants of Health   Financial Resource Strain: Not on file  Food Insecurity: Not on file  Transportation Needs: Not on file  Physical Activity: Not on file  Stress: Not on file  Social Connections: Not on file   Additional Social History:     Sleep: Fair  Appetite:  Poor  Current Medications: Current Facility-Administered Medications  Medication Dose Route Frequency Provider Last Rate Last Admin  . acetaminophen (TYLENOL) tablet 650 mg  650 mg Oral Q6H PRN [name], [name], MD      . alum & mag hydroxide-simeth (MAALOX/MYLANTA) 200-200-20 MG/5ML suspension 30 mL  30 mL Oral Q4H PRN [name], [name], MD      . citalopram (CELEXA) tablet 20 mg  20 mg Oral Daily [name], [name], MD  20 mg at 10/26/20 0806  . feeding supplement (ENSURE ENLIVE / ENSURE PLUS) liquid 237 mL  237 mL Oral BID BM [name], MD   237 mL at 10/26/20 1030  . guaiFENesin (ROBITUSSIN) 100 MG/5ML solution 200 mg  200 mg Oral Q4H PRN [name], MD   200 mg at 10/23/20 0800  . hydrOXYzine (ATARAX/VISTARIL) tablet 25 mg  25 mg Oral TID PRN [name], [name], MD   25 mg at 10/20/20 1352  . LORazepam (ATIVAN) tablet 1 mg  1 mg Oral TID [name], MD   1 mg at 10/26/20 1157  . magnesium hydroxide (MILK OF MAGNESIA) suspension 30 mL  30 mL Oral Daily PRN [name], [name], MD      . multivitamin with minerals tablet 1 tablet  1  tablet Oral Daily [name], MD   1 tablet at 10/26/20 0805  . naltrexone (DEPADE) tablet 50 mg  50 mg Oral Daily [name], MD   50 mg at 10/26/20 4403  . nicotine (NICODERM CQ - dosed in mg/24 hours) patch 21 mg  21 mg Transdermal Daily [name], [name], MD   21 mg at 10/26/20 0807  . pneumococcal 23 valent vaccine (PNEUMOVAX-23) injection 0.5 mL  0.5 mL Intramuscular Tomorrow-1000 [name], MD      . risperiDONE (RISPERDAL M-TABS) disintegrating tablet 1 mg  1 mg Oral Daily [name], MD   1 mg at 10/26/20 0805  . risperiDONE (RISPERDAL M-TABS) disintegrating tablet 3 mg  3 mg Oral QHS [name], MD      . thiamine tablet 100 mg  100 mg Oral Daily [name], MD   100 mg at 10/26/20 0805  . traZODone (DESYREL) tablet 50 mg  50 mg Oral QHS PRN [name], [name], MD   50 mg at 10/23/20 2202    Lab Results:  No results found for this or any previous visit (from the past 48 hour(s)).  Blood Alcohol level:  Lab Results  Component Value Date   ETH <10 10/18/2020    Metabolic Disorder Labs: Lab Results  Component Value Date   HGBA1C 5.6 10/20/2020   MPG 114.02 10/20/2020   No results found for: PROLACTIN Lab Results  Component Value Date   CHOL 184 10/20/2020   TRIG 143 10/20/2020   HDL 51 10/20/2020   CHOLHDL 3.6 10/20/2020   VLDL 29 10/20/2020   LDLCALC 104 (H) 10/20/2020    Physical Findings: AIMS: Facial and Oral Movements Muscles of Facial Expression: None, normal Lips and Perioral Area: None, normal Jaw: None, normal Tongue: None, normal,Extremity Movements Upper (arms, wrists, hands, fingers): None, normal Lower (legs, knees, ankles, toes): None, normal, Trunk Movements Neck, shoulders, hips: None, normal, Overall Severity Severity of abnormal movements (highest score from questions above): None, normal Incapacitation due to abnormal movements: None, normal Patient's awareness of abnormal movements (rate only patient's report):  No Awareness, Dental Status Current problems with teeth and/or dentures?: No Does patient usually wear dentures?: No  CIWA:  CIWA-Ar Total: 4 COWS:  COWS Total Score: 1  Musculoskeletal: Strength & Muscle Tone: within normal limits Gait & Station: normal Patient leans: N/A  Psychiatric Specialty Exam:  Presentation  General Appearance: Disheveled  Eye Contact:Minimal  Speech:Normal Rate  Speech Volume:Normal  Handedness:Right   Mood and Affect  Mood:Anxious  Affect:Constricted   Thought Process  Thought Processes:Goal Directed  Descriptions of Associations:Loose  Orientation:Partial  Thought Content: Delusions, illogical  History of Schizophrenia/Schizoaffective disorder:No  Duration of Psychotic Symptoms:Less than six months  Hallucinations:Audiotry hallucinations of whispers Ideas of Reference:None  Suicidal Thoughts: Yes, suicidal ideations with plan to OD, no access to means here in the hospital  Homicidal Thoughts:Denies  Sensorium  Memory:Immediate Poor; Recent Fair; Remote Fair  Judgment:Impaired  Insight:Lacking   Executive Functions  Concentration:Poor  Attention Span:Poor  Recall:Poor  Fund of Knowledge:Fair  Language:Fair   Psychomotor Activity  Psychomotor Activity:Decreased  Assets  Assets:Desire for Improvement; Physical Health   Sleep  Sleep:Fair   Physical Exam: Physical Exam  ROS  Blood pressure 139/89, pulse 81, temperature 98 F (36.7 C), temperature source Oral, resp. rate 18, height 5\' 4"  (1.626 m), weight 47.6 kg, SpO2 100 %. Body mass index is 18.02 kg/m.   Treatment Plan Summary: Daily contact with patient to assess and evaluate symptoms and progress in treatment and Medication management Patient presenting with MDD, recurrent, severe with psychotic features. She continues to endorse suicidal ideations, but is able to contract for safety in the hospital. Today feels she is not allowed to eat. Presumably  voices commanding her not to do so. Symptoms of catatonia partially improved. Continue Celexa 20 mg daily. Increase Risperdal M-tabs 1 mg in the morning, 3 mg QHS, continue Ativan 1 mg PO TID for catatonia. Nicoderm patch for tobacco use disorder. Naltrexone for alcohol use disorder.   10/26/20: Psychiatric exam above reviewed and remains accurate. Assessment and plan above reviewed and updated.     10/28/20, MD 10/26/2020, 12:45 PM

## 2020-10-26 NOTE — [facility] Group Notes (Signed)
LCSW Group Therapy Note  10/26/2020 1:48 PM  Type of Therapy/Topic:  Group Therapy:  Feelings about Diagnosis  Participation Level:  Did Not Attend   Description of Group:   This group will allow patients to explore their thoughts and feelings about diagnoses they have received. Patients will be guided to explore their level of understanding and acceptance of these diagnoses. Facilitator will encourage patients to process their thoughts and feelings about the reactions of others to their diagnosis and will guide patients in identifying ways to discuss their diagnosis with significant others in their lives. This group will be process-oriented, with patients participating in exploration of their own experiences, giving and receiving support, and processing challenge from other group members.   Therapeutic Goals: 1. Patient will demonstrate understanding of diagnosis as evidenced by identifying two or more symptoms of the disorder 2. Patient will be able to express two feelings regarding the diagnosis 3. Patient will demonstrate their ability to communicate their needs through discussion and/or role play  Summary of Patient Progress: X  Therapeutic Modalities:   Cognitive Behavioral Therapy Brief Therapy Feelings Identification   Cristina Finley R. Algis Greenhouse, MSW, LCSW, LCAS 10/26/2020 1:48 PM

## 2020-10-27 ENCOUNTER — Inpatient Hospital Stay: Payer: BC Managed Care – PPO

## 2020-10-27 DIAGNOSIS — F333 Major depressive disorder, recurrent, severe with psychotic symptoms: Secondary | ICD-10-CM | POA: Diagnosis not present

## 2020-10-27 MED ORDER — LORAZEPAM 2 MG PO TABS
2.0000 mg | ORAL_TABLET | Freq: Three times a day (TID) | ORAL | Status: DC
  Administered 2020-10-27 – 2020-11-01 (×15): 2 mg via ORAL
  Filled 2020-10-27 (×15): qty 1

## 2020-10-27 MED ORDER — RISPERIDONE 1 MG PO TBDP
4.0000 mg | ORAL_TABLET | Freq: Every day | ORAL | Status: DC
  Administered 2020-10-27: 4 mg via ORAL
  Filled 2020-10-27: qty 4

## 2020-10-27 NOTE — [facility] Group Notes (Signed)
  LCSW Group Therapy Note     10/27/2020 1:51 PM     Type of Therapy/Topic:  Group Therapy:  Emotion Regulation     Participation Level:  Did Not Attend     Description of Group:   The purpose of this group is to assist patients in learning to regulate negative emotions and experience positive emotions. Patients will be guided to discuss ways in which they have been vulnerable to their negative emotions. These vulnerabilities will be juxtaposed with experiences of positive emotions or situations, and patients will be challenged to use positive emotions to combat negative ones. Special emphasis will be placed on coping with negative emotions in conflict situations, and patients will process healthy conflict resolution skills.     Therapeutic Goals:  1.    Patient will identify two positive emotions or experiences to reflect on in order to balance out negative emotions  2.    Patient will label two or more emotions that they find the most difficult to experience  3.    Patient will demonstrate positive conflict resolution skills through discussion and/or role plays     Summary of Patient Progress:  X  Therapeutic Modalities:   Cognitive Behavioral Therapy  Feelings Identification  Dialectical Behavioral Therapy   Shakeena Kafer, MSW, LCSW-A  10/27/2020 1:51 PM  

## 2020-10-27 NOTE — Progress Notes (Signed)
Patient alert and oriented x 4, she was noted isolated to her room, minimal interaction with peers and staff, she currently denies SI/HI/AVH, her thoughts are organized and coherent. Patient is receptive, she was complaint with medication regimen, 15 minutes safety checks maintained,, will  continue to monitor.

## 2020-10-27 NOTE — Progress Notes (Signed)
Patient alert and oriented x 4, she was noted isolated to her room, minimal interaction with peers and staff, she currently denies SI/HI/AVH, she rated depression a 7/10 ( low 0  - 10 high )  her thoughts are organized and coherent. Patient is receptive to staff, she was complaint with medication regimen, 15 minutes safety checks maintained,, will  continue to monitor

## 2020-10-27 NOTE — [facility] Group Notes (Signed)
[facility] Group Notes:  (Nursing/MHT/Case Management/Adjunct)  Date:  10/27/2020  Time:  8:33 PM  Type of Therapy:  Group Therapy  Participation Level:  Did Not Attend   Mayra Neer 10/27/2020, 8:33 PM

## 2020-10-27 NOTE — Progress Notes (Signed)
Recreation Therapy Notes    Date: 10/27/2020  Time: 9:30 am   Location: Courtyard   Behavioral response: N/A   Intervention Topic: Social-Skills    Discussion/Intervention: Patient did not attend group.   Clinical Observations/Feedback:  Patient did not attend group.   Elani Delph LRT/CTRS        Geza Beranek 10/27/2020 11:51 AM

## 2020-10-27 NOTE — Progress Notes (Addendum)
Patient was asked to come down for lunch. She stated, "I cannot leave my room. I was told not to leave my room." Patient was reassured she could leave her room, but patient continues to stand in the dark. Lunch tray was brought to her room.  Lunch tray was picked up from patient's room - patient ate entire lunch tray.
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
## 2020-10-27 NOTE — Progress Notes (Addendum)
[facility] MD Progress Note  10/27/2020 10:09 AM Cristina Finley  MRN:  [id number]   CC "I'm fine. Not really."  Subjective:  45 year old female with history of depression presenting for worsening mood and suicidal ideations. No acute events overnight, medication compliant, ADLs impaired. This morning patient initially refused medications twice, but took them once nurse brought to her room. She is again sitting in the dark in the chair in her room with her arms crossed. She is minimally cooperative with exam, with irritable mood. She does admit she is still having suicidal ideations, and she is still hearing voices. She continues to refuse to state what the voices are telling her. However, she continues to feel she is not allowed to eat food. This is despite encouragement to eat, and delivery of food to her room. She denies any homicidal ideations or visual hallucinations.   Principal Problem: MDD (major depressive disorder), recurrent, severe, with psychosis (HCC) Diagnosis: Principal Problem:   MDD (major depressive disorder), recurrent, severe, with psychosis (HCC) Active Problems:   Alcohol abuse   Nicotine abuse  Total Time spent with patient: 30 minutes  Past Psychiatric History: See H&P  Past Medical History: History reviewed. No pertinent past medical history. History reviewed. No pertinent surgical history. Family History: History reviewed. No pertinent family history. Family Psychiatric  History: See H&P Social History:  Social History   Substance and Sexual Activity  Alcohol Use Yes  . Alcohol/week: 6.0 standard drinks  . Types: 6 Cans of beer per week   Comment: one 6 pack daily     Social History   Substance and Sexual Activity  Drug Use Yes  . Types: Marijuana   Comment: last used 2 days ago per pt    Social History   Socioeconomic History  . Marital status: Married    Spouse name: Not on file  . Number of children: Not on file  . Years of education: Not on file  .  Highest education level: Not on file  Occupational History  . Not on file  Tobacco Use  . Smoking status: Current Every Day Smoker    Packs/day: 1.50    Types: Cigarettes  . Smokeless tobacco: Never Used  Vaping Use  . Vaping Use: Never used  Substance and Sexual Activity  . Alcohol use: Yes    Alcohol/week: 6.0 standard drinks    Types: 6 Cans of beer per week    Comment: one 6 pack daily  . Drug use: Yes    Types: Marijuana    Comment: last used 2 days ago per pt  . Sexual activity: Yes    Partners: Male    Birth control/protection: Surgical  Other Topics Concern  . Not on file  Social History Narrative  . Not on file   Social Determinants of Health   Financial Resource Strain: Not on file  Food Insecurity: Not on file  Transportation Needs: Not on file  Physical Activity: Not on file  Stress: Not on file  Social Connections: Not on file   Additional Social History:                         Sleep: Poor  Appetite:  Poor  Current Medications: Current Facility-Administered Medications  Medication Dose Route Frequency Provider Last Rate Last Admin  . acetaminophen (TYLENOL) tablet 650 mg  650 mg Oral Q6H PRN [name], [name], MD      . alum & mag hydroxide-simeth (MAALOX/MYLANTA) 200-200-20  MG/5ML suspension 30 mL  30 mL Oral Q4H PRN [name], [name], MD      . citalopram (CELEXA) tablet 20 mg  20 mg Oral Daily [name], [name], MD   20 mg at 10/27/20 0815  . feeding supplement (ENSURE ENLIVE / ENSURE PLUS) liquid 237 mL  237 mL Oral BID BM [name], MD   237 mL at 10/26/20 1030  . guaiFENesin (ROBITUSSIN) 100 MG/5ML solution 200 mg  200 mg Oral Q4H PRN [name], MD   200 mg at 10/23/20 0800  . hydrOXYzine (ATARAX/VISTARIL) tablet 25 mg  25 mg Oral TID PRN [name], [name], MD   25 mg at 10/20/20 1352  . LORazepam (ATIVAN) tablet 1 mg  1 mg Oral TID [name], MD   1 mg at 10/27/20 5102  . magnesium hydroxide (MILK OF MAGNESIA) suspension  30 mL  30 mL Oral Daily PRN [name], [name], MD      . multivitamin with minerals tablet 1 tablet  1 tablet Oral Daily [name], MD   1 tablet at 10/26/20 0805  . naltrexone (DEPADE) tablet 50 mg  50 mg Oral Daily [name], MD   50 mg at 10/27/20 0815  . nicotine (NICODERM CQ - dosed in mg/24 hours) patch 21 mg  21 mg Transdermal Daily [name], [name], MD   21 mg at 10/26/20 0807  . pneumococcal 23 valent vaccine (PNEUMOVAX-23) injection 0.5 mL  0.5 mL Intramuscular Tomorrow-1000 [name], MD      . risperiDONE (RISPERDAL M-TABS) disintegrating tablet 1 mg  1 mg Oral Daily [name], MD   1 mg at 10/27/20 0815  . risperiDONE (RISPERDAL M-TABS) disintegrating tablet 4 mg  4 mg Oral QHS [name], MD      . thiamine tablet 100 mg  100 mg Oral Daily [name], MD   100 mg at 10/27/20 0815  . traZODone (DESYREL) tablet 50 mg  50 mg Oral QHS PRN [name], [name], MD   50 mg at 10/23/20 2202    Lab Results: No results found for this or any previous visit (from the past 48 hour(s)).  Blood Alcohol level:  Lab Results  Component Value Date   ETH <10 10/18/2020    Metabolic Disorder Labs: Lab Results  Component Value Date   HGBA1C 5.6 10/20/2020   MPG 114.02 10/20/2020   No results found for: PROLACTIN Lab Results  Component Value Date   CHOL 184 10/20/2020   TRIG 143 10/20/2020   HDL 51 10/20/2020   CHOLHDL 3.6 10/20/2020   VLDL 29 10/20/2020   LDLCALC 104 (H) 10/20/2020    Physical Findings: AIMS: Facial and Oral Movements Muscles of Facial Expression: None, normal Lips and Perioral Area: None, normal Jaw: None, normal Tongue: None, normal,Extremity Movements Upper (arms, wrists, hands, fingers): None, normal Lower (legs, knees, ankles, toes): None, normal, Trunk Movements Neck, shoulders, hips: None, normal, Overall Severity Severity of abnormal movements (highest score from questions above): None, normal Incapacitation due to  abnormal movements: None, normal Patient's awareness of abnormal movements (rate only patient's report): No Awareness, Dental Status Current problems with teeth and/or dentures?: No Does patient usually wear dentures?: No  CIWA:  CIWA-Ar Total: 4 COWS:  COWS Total Score: 1  Musculoskeletal: Strength & Muscle Tone: within normal limits Gait & Station: normal Patient leans: N/A  Psychiatric Specialty Exam:  Presentation  General Appearance: Disheveled  Eye Contact:Minimal  Speech:Blocked  Speech Volume:Decreased  Handedness:Right   Mood and Affect  Mood:Irritable  Affect:Congruent   Thought Process  Thought Processes:Goal Directed  Descriptions of Associations:Loose  Orientation:Partial  Thought Content:Delusions; Illogical; Paranoid Ideation  History of Schizophrenia/Schizoaffective disorder:No  Duration of Psychotic Symptoms:Less than six months  Hallucinations:Hallucinations: Auditory  Ideas of Reference:Percusatory  Suicidal Thoughts:Suicidal Thoughts: Yes, Active  Homicidal Thoughts:Homicidal Thoughts: No   Sensorium  Memory:Immediate Fair; Recent Poor; Remote Poor  Judgment:Impaired  Insight:Shallow   Executive Functions  Concentration:Poor  Attention Span:Poor  Recall:Poor  Fund of Knowledge:Fair  Language:Fair   Psychomotor Activity  Psychomotor Activity:Psychomotor Activity: Decreased   Assets  Assets:Desire for Improvement; Physical Health   Sleep  Sleep:Sleep: Poor Number of Hours of Sleep: 3.5    Physical Exam: Physical Exam ROS Blood pressure 110/71, pulse 69, temperature 97.7 F (36.5 C), temperature source Oral, resp. rate 18, height 5\' 4"  (1.626 m), weight 47.6 kg, SpO2 99 %. Body mass index is 18.02 kg/m.   Treatment Plan Summary: Daily contact with patient to assess and evaluate symptoms and progress in treatment and Medication management 45 year old female with MDD, recurrent, severe, with psychotic  features. She continues to have command auditory hallucinations and suicidal ideations. She contracts for safety in the hospital stating, "Well, there's nothing in here to kill myself with." Symptoms of catatonia still present. Continue Celexa 20 mg daily for mood. Increase Ativan 2 mg TID for catatonia. Increase risperdal 1 mg in the morning, 4mg  in the evening for psychosis. Continue Naltrexone 50 mg daily for alcohol use disorder, nicoderm 21 mg patch daily for tobacco use disorder.   55, MD 10/27/2020, 10:09 AM

## 2020-10-27 NOTE — Progress Notes (Signed)
Patient is assessed in her room. Patient endorses passive SI and is observed to be responding to internal stimuli. Patient is holding her hands over her head and is pacing around her room in the dark. Patient refuses to come down to the dayroom to eat. When this writer offered to bring her food to her room, she states, "I'm not allowed to eat". Patient was reassured she could eat, but continues to refuse. Patient also states, "I don't need medication." When medication was brought to her room in a second attempt, patient still has her hands up in the air and states, "I need to clear my thoughts. Maybe later." On a third attempt, patient agrees to take all her medications except for the multivitamin and nicotine patch. Patient is also refusing her 10 am Ensure and states, "I am sick of those drinks".   Patient has been observed to come out of her room for brief periods of time to use the phone. She interacts minimally with others on the unit and with staff. Support and encouragement provided. Patient remains safe on the unit at this time and q15 min safety checks have been maintained.

## 2020-10-27 NOTE — Progress Notes (Signed)
Patient has her hands up above her head in her room. She is standing in the dark. She says, "I tried talking to you telling you I'm sorry". Patient tells this Clinical research associate, "I am talking to the wall." Patient says that she cannot eat dinner right now. Patient's dinner tray was brought to her room.

## 2020-10-27 NOTE — Progress Notes (Signed)
Patient received two phone calls that she declines. She is standing in the hallway with her hands above her head. She says she cannot talk right now and is observed to be responding to internal stimuli.

## 2020-10-28 DIAGNOSIS — F333 Major depressive disorder, recurrent, severe with psychotic symptoms: Secondary | ICD-10-CM | POA: Diagnosis not present

## 2020-10-28 MED ORDER — RISPERIDONE 1 MG PO TBDP
5.0000 mg | ORAL_TABLET | Freq: Every day | ORAL | Status: DC
  Administered 2020-10-28: 5 mg via ORAL
  Filled 2020-10-28: qty 5

## 2020-10-28 NOTE — Progress Notes (Signed)
Patient was assessed in her room. Patient is sitting on the edge of her bed in the dark. She is tearful. Patient states she cannot leave her room to eat breakfast. She says, "they" are telling her she cannot leave the room. When asked who "they" is referring to, she does not respond. She states, "I am fine". Patient denies SI, HI, and AVH today. However, she is observed to be responding to internal stimuli. Patient was compliant with all morning medications except the nicotine patch.  After breakfast, patient left her room to use the phones. She had been calling 911 and the sheriff's office to turn herself in. Patient was told she does not have any charges, but patient says "they are telling me I do". Patient was told she cannot continue calling the sheriff's office. Patient was irritable but returned to her room afterwards.  Patient remains safe on the unit at this time.

## 2020-10-28 NOTE — Progress Notes (Signed)
[facility] MD Progress Note  10/28/2020 10:11 AM Cristina Finley  MRN:  [id number]   CC "Im trying to do the right thing"  Subjective:  45 year old female with history of depression presenting for worsening mood and suicidal ideations. No acute events overnight, medication compliant, ADLs impaired. This morning patient called 911 multiple times to try and turn herself in beleiving she has warrants out for her arrest. She was difficult to redirect back to her room. She has been observed responding to internal stimuli, and intermittently putting her arms up stating she's under arrest. CT Head completed yesterday and negative for acute intracranial process. On exam, she continues to have severe depression and suicidal ideations. She denies any visual hallucinations, or homicidal ideations. She continues to have auditory hallucinations. She feels that they are quieter than on admission. However, given calls to 911 unsure if this is true. She denies any side effects to medications.    Principal Problem: MDD (major depressive disorder), recurrent, severe, with psychosis (HCC) Diagnosis: Principal Problem:   MDD (major depressive disorder), recurrent, severe, with psychosis (HCC) Active Problems:   Alcohol abuse   Nicotine abuse  Total Time spent with patient: 30 minutes  Past Psychiatric History: See H&P  Past Medical History: History reviewed. No pertinent past medical history. History reviewed. No pertinent surgical history. Family History: History reviewed. No pertinent family history. Family Psychiatric  History: See H&P Social History:  Social History   Substance and Sexual Activity  Alcohol Use Yes  . Alcohol/week: 6.0 standard drinks  . Types: 6 Cans of beer per week   Comment: one 6 pack daily     Social History   Substance and Sexual Activity  Drug Use Yes  . Types: Marijuana   Comment: last used 2 days ago per pt    Social History   Socioeconomic History  . Marital status:  Married    Spouse name: Not on file  . Number of children: Not on file  . Years of education: Not on file  . Highest education level: Not on file  Occupational History  . Not on file  Tobacco Use  . Smoking status: Current Every Day Smoker    Packs/day: 1.50    Types: Cigarettes  . Smokeless tobacco: Never Used  Vaping Use  . Vaping Use: Never used  Substance and Sexual Activity  . Alcohol use: Yes    Alcohol/week: 6.0 standard drinks    Types: 6 Cans of beer per week    Comment: one 6 pack daily  . Drug use: Yes    Types: Marijuana    Comment: last used 2 days ago per pt  . Sexual activity: Yes    Partners: Male    Birth control/protection: Surgical  Other Topics Concern  . Not on file  Social History Narrative  . Not on file   Social Determinants of Health   Financial Resource Strain: Not on file  Food Insecurity: Not on file  Transportation Needs: Not on file  Physical Activity: Not on file  Stress: Not on file  Social Connections: Not on file   Additional Social History:                         Sleep: Poor  Appetite:  Poor  Current Medications: Current Facility-Administered Medications  Medication Dose Route Frequency Provider Last Rate Last Admin  . acetaminophen (TYLENOL) tablet 650 mg  650 mg Oral Q6H PRN [name], [name], MD      .  alum & mag hydroxide-simeth (MAALOX/MYLANTA) 200-200-20 MG/5ML suspension 30 mL  30 mL Oral Q4H PRN [name], [name], MD      . citalopram (CELEXA) tablet 20 mg  20 mg Oral Daily [name], [name], MD   20 mg at 10/28/20 0721  . feeding supplement (ENSURE ENLIVE / ENSURE PLUS) liquid 237 mL  237 mL Oral BID BM [name], MD   237 mL at 10/26/20 1030  . guaiFENesin (ROBITUSSIN) 100 MG/5ML solution 200 mg  200 mg Oral Q4H PRN [name], MD   200 mg at 10/23/20 0800  . hydrOXYzine (ATARAX/VISTARIL) tablet 25 mg  25 mg Oral TID PRN [name], [name], MD   25 mg at 10/20/20 1352  . LORazepam (ATIVAN) tablet 2 mg   2 mg Oral TID [name], MD   2 mg at 10/28/20 6269  . magnesium hydroxide (MILK OF MAGNESIA) suspension 30 mL  30 mL Oral Daily PRN [name], [name], MD      . multivitamin with minerals tablet 1 tablet  1 tablet Oral Daily [name], MD   1 tablet at 10/28/20 0721  . naltrexone (DEPADE) tablet 50 mg  50 mg Oral Daily [name], MD   50 mg at 10/28/20 0721  . nicotine (NICODERM CQ - dosed in mg/24 hours) patch 21 mg  21 mg Transdermal Daily [name], [name], MD   21 mg at 10/26/20 0807  . pneumococcal 23 valent vaccine (PNEUMOVAX-23) injection 0.5 mL  0.5 mL Intramuscular Tomorrow-1000 [name], MD      . risperiDONE (RISPERDAL M-TABS) disintegrating tablet 1 mg  1 mg Oral Daily [name], MD   1 mg at 10/28/20 4854  . risperiDONE (RISPERDAL M-TABS) disintegrating tablet 5 mg  5 mg Oral QHS [name], MD      . thiamine tablet 100 mg  100 mg Oral Daily [name], MD   100 mg at 10/28/20 0721  . traZODone (DESYREL) tablet 50 mg  50 mg Oral QHS PRN [name], [name], MD   50 mg at 10/23/20 2202    Lab Results: No results found for this or any previous visit (from the past 48 hour(s)).  Blood Alcohol level:  Lab Results  Component Value Date   ETH <10 10/18/2020    Metabolic Disorder Labs: Lab Results  Component Value Date   HGBA1C 5.6 10/20/2020   MPG 114.02 10/20/2020   No results found for: PROLACTIN Lab Results  Component Value Date   CHOL 184 10/20/2020   TRIG 143 10/20/2020   HDL 51 10/20/2020   CHOLHDL 3.6 10/20/2020   VLDL 29 10/20/2020   LDLCALC 104 (H) 10/20/2020    Physical Findings: AIMS: Facial and Oral Movements Muscles of Facial Expression: None, normal Lips and Perioral Area: None, normal Jaw: None, normal Tongue: None, normal,Extremity Movements Upper (arms, wrists, hands, fingers): None, normal Lower (legs, knees, ankles, toes): None, normal, Trunk Movements Neck, shoulders, hips: None, normal, Overall  Severity Severity of abnormal movements (highest score from questions above): None, normal Incapacitation due to abnormal movements: None, normal Patient's awareness of abnormal movements (rate only patient's report): No Awareness, Dental Status Current problems with teeth and/or dentures?: No Does patient usually wear dentures?: No  CIWA:  CIWA-Ar Total: 4 COWS:  COWS Total Score: 1  Musculoskeletal: Strength & Muscle Tone: within normal limits Gait & Station: normal Patient leans: N/A  Psychiatric Specialty Exam:  Presentation  General  Appearance: Disheveled  Eye Contact:Minimal  Speech:Blocked  Speech Volume:Decreased  Handedness:Right   Mood and Affect  Mood:Irritable  Affect:Congruent   Thought Process  Thought Processes:Goal Directed  Descriptions of Associations:Loose  Orientation:Partial  Thought Content:Delusions; Illogical; Paranoid Ideation  History of Schizophrenia/Schizoaffective disorder:No  Duration of Psychotic Symptoms:Less than six months  Hallucinations:Hallucinations: Auditory  Ideas of Reference:Percusatory  Suicidal Thoughts:Suicidal Thoughts: Yes, Active  Homicidal Thoughts:Homicidal Thoughts: No   Sensorium  Memory:Immediate Fair; Recent Poor; Remote Poor  Judgment:Impaired  Insight:Shallow   Executive Functions  Concentration:Poor  Attention Span:Poor  Recall:Poor  Fund of Knowledge:Fair  Language:Fair   Psychomotor Activity  Psychomotor Activity:Psychomotor Activity: Decreased   Assets  Assets:Desire for Improvement; Physical Health   Sleep  Sleep: Poor, 1.45   Physical Exam: Physical Exam  ROS  Blood pressure 113/70, pulse 64, temperature 98 F (36.7 C), temperature source Oral, resp. rate 16, height 5\' 4"  (1.626 m), weight 47.6 kg, SpO2 100 %. Body mass index is 18.02 kg/m.   Treatment Plan Summary: Daily contact with patient to assess and evaluate symptoms and progress in treatment and  Medication management 45 year old female with MDD, recurrent, severe, with psychotic features. She continues to have command auditory hallucinations and suicidal ideations. She contracts for safety in the hospital stating, "Well, there's nothing in here to kill myself with." Symptoms of catatonia still present. She reports that the voices are somewhat quieter than on admission. Continue Celexa 20 mg daily for mood. Continue Ativan 2 mg TID for catatonia. Increase risperdal 1 mg in the morning, 5 mg in the evening for psychosis. Continue Naltrexone 50 mg daily for alcohol use disorder, nicoderm 21 mg patch daily for tobacco use disorder.   55, MD 10/28/2020, 10:11 AM

## 2020-10-28 NOTE — Progress Notes (Signed)
Recreation Therapy Notes   Date: 10/28/2020  Time: 9:30 am   Location: Courtyard   Behavioral response: N/A   Intervention Topic: Relaxation   Discussion/Intervention: Patient did not attend group.   Clinical Observations/Feedback:  Patient did not attend group.   Seville Brick LRT/CTRS         Don Tiu 10/28/2020 11:23 AM

## 2020-10-28 NOTE — [facility] Group Notes (Signed)
LCSW Group Therapy Note  10/28/2020 2:44 PM  Type of Therapy/Topic:  Group Therapy:  Balance in Life  Participation Level:  Did Not Attend  Description of Group:    This group will address the concept of balance and how it feels and looks when one is unbalanced. Patients will be encouraged to process areas in their lives that are out of balance and identify reasons for remaining unbalanced. Facilitators will guide patients in utilizing problem-solving interventions to address and correct the stressor making their life unbalanced. Understanding and applying boundaries will be explored and addressed for obtaining and maintaining a balanced life. Patients will be encouraged to explore ways to assertively make their unbalanced needs known to significant others in their lives, using other group members and facilitator for support and feedback.  Therapeutic Goals: 1. Patient will identify two or more emotions or situations they have that consume much of in their lives. 2. Patient will identify signs/triggers that life has become out of balance:  3. Patient will identify two ways to set boundaries in order to achieve balance in their lives:  4. Patient will demonstrate ability to communicate their needs through discussion and/or role plays  Summary of Patient Progress: X  Therapeutic Modalities:   Cognitive Behavioral Therapy Solution-Focused Therapy Assertiveness Training  Simona Huh R. Algis Greenhouse, MSW, LCSW, LCAS 10/28/2020 2:44 PM

## 2020-10-29 DIAGNOSIS — F333 Major depressive disorder, recurrent, severe with psychotic symptoms: Secondary | ICD-10-CM | POA: Diagnosis not present

## 2020-10-29 MED ORDER — RISPERIDONE 1 MG PO TBDP
2.0000 mg | ORAL_TABLET | Freq: Every day | ORAL | Status: DC
  Administered 2020-10-29 – 2020-11-03 (×6): 2 mg via ORAL
  Filled 2020-10-29 (×6): qty 2

## 2020-10-29 MED ORDER — RISPERIDONE 1 MG PO TBDP
4.0000 mg | ORAL_TABLET | Freq: Every day | ORAL | Status: DC
  Administered 2020-10-30 – 2020-10-31 (×2): 4 mg via ORAL
  Filled 2020-10-29 (×2): qty 4

## 2020-10-29 NOTE — [facility] Group Notes (Signed)
LCSW Group Therapy Note     10/29/2020 2:11 PM     Type of Therapy and Topic:  Group Therapy:  Feelings around Relapse and Recovery     Participation Level:  Did Not Attend     Description of Group:    Patients in this group will discuss emotions they experience before and after a relapse. They will process how experiencing these feelings, or avoidance of experiencing them, relates to having a relapse. Facilitator will guide patients to explore emotions they have related to recovery. Patients will be encouraged to process which emotions are more powerful. They will be guided to discuss the emotional reaction significant others in their lives may have to their relapse or recovery. Patients will be assisted in exploring ways to respond to the emotions of others without this contributing to a relapse.     Therapeutic Goals:  1.    Patient will identify two or more emotions that lead to a relapse for them  2.    Patient will identify two emotions that result when they relapse  3.    Patient will identify two emotions related to recovery  4.    Patient will demonstrate ability to communicate their needs through discussion and/or role plays        Summary of Patient Progress: X  Therapeutic Modalities:   Cognitive Behavioral Therapy  Solution-Focused Therapy  Assertiveness Training  Relapse Prevention Therapy        Dashana Guizar, MSW, LCSW-A  10/29/2020 2:11 PM  

## 2020-10-29 NOTE — Progress Notes (Signed)
Recreation Therapy Notes   Date: 10/29/2020  Time: 9:30 am   Location: Courtyard   Behavioral response: N/A   Intervention Topic: Wellness    Discussion/Intervention: Patient did not attend group.   Clinical Observations/Feedback:  Patient did not attend group.   Eleah Lahaie LRT/CTRS         Jeannette Maddy 10/29/2020 10:38 AM 

## 2020-10-29 NOTE — Progress Notes (Signed)
[facility] MD Progress Note  10/29/2020 10:36 AM Cristina Finley  MRN:  [id number]   CC "I don't want that much medication"  Subjective:  45 year old female with history of depression presenting for worsening mood and suicidal ideations. No acute events overnight,  ADLs impaired. She was observed spitting out her medications that had only partially disolved over night. This morning patient seen one-on-one. She is is sitting in the dark, and has only partially eaten her breakfast. She continues to be acutely suicidal, and reports voices. She states that she is on too much Ativan, and does not want to take that much. Informed patient that the medication she was spitting out was Risperdal, and this was the antipsychotic medication for her depression and voices. Patient stated she was unaware. Education provided on current regimen and indications. Encouraged her to take her medications as prescribed, and with improvement they can be decreased.    Principal Problem: MDD (major depressive disorder), recurrent, severe, with psychosis (HCC) Diagnosis: Principal Problem:   MDD (major depressive disorder), recurrent, severe, with psychosis (HCC) Active Problems:   Alcohol abuse   Nicotine abuse  Total Time spent with patient: 30 minutes  Past Psychiatric History: See H&P  Past Medical History: History reviewed. No pertinent past medical history. History reviewed. No pertinent surgical history. Family History: History reviewed. No pertinent family history. Family Psychiatric  History: See H&P Social History:  Social History   Substance and Sexual Activity  Alcohol Use Yes  . Alcohol/week: 6.0 standard drinks  . Types: 6 Cans of beer per week   Comment: one 6 pack daily     Social History   Substance and Sexual Activity  Drug Use Yes  . Types: Marijuana   Comment: last used 2 days ago per pt    Social History   Socioeconomic History  . Marital status: Married    Spouse name: Not on file  .  Number of children: Not on file  . Years of education: Not on file  . Highest education level: Not on file  Occupational History  . Not on file  Tobacco Use  . Smoking status: Current Every Day Smoker    Packs/day: 1.50    Types: Cigarettes  . Smokeless tobacco: Never Used  Vaping Use  . Vaping Use: Never used  Substance and Sexual Activity  . Alcohol use: Yes    Alcohol/week: 6.0 standard drinks    Types: 6 Cans of beer per week    Comment: one 6 pack daily  . Drug use: Yes    Types: Marijuana    Comment: last used 2 days ago per pt  . Sexual activity: Yes    Partners: Male    Birth control/protection: Surgical  Other Topics Concern  . Not on file  Social History Narrative  . Not on file   Social Determinants of Health   Financial Resource Strain: Not on file  Food Insecurity: Not on file  Transportation Needs: Not on file  Physical Activity: Not on file  Stress: Not on file  Social Connections: Not on file   Additional Social History:                         Sleep: Poor  Appetite:  Poor  Current Medications: Current Facility-Administered Medications  Medication Dose Route Frequency Provider Last Rate Last Admin  . acetaminophen (TYLENOL) tablet 650 mg  650 mg Oral Q6H PRN [name], [name], MD      .  alum & mag hydroxide-simeth (MAALOX/MYLANTA) 200-200-20 MG/5ML suspension 30 mL  30 mL Oral Q4H PRN [name], [name], MD      . citalopram (CELEXA) tablet 20 mg  20 mg Oral Daily [name], [name], MD   20 mg at 10/29/20 0819  . feeding supplement (ENSURE ENLIVE / ENSURE PLUS) liquid 237 mL  237 mL Oral BID BM [name], MD   237 mL at 10/29/20 1006  . guaiFENesin (ROBITUSSIN) 100 MG/5ML solution 200 mg  200 mg Oral Q4H PRN [name], MD   200 mg at 10/23/20 0800  . hydrOXYzine (ATARAX/VISTARIL) tablet 25 mg  25 mg Oral TID PRN [name], [name], MD   25 mg at 10/20/20 1352  . LORazepam (ATIVAN) tablet 2 mg  2 mg Oral TID [name], MD   2 mg  at 10/29/20 5638  . magnesium hydroxide (MILK OF MAGNESIA) suspension 30 mL  30 mL Oral Daily PRN [name], [name], MD      . multivitamin with minerals tablet 1 tablet  1 tablet Oral Daily [name], MD   1 tablet at 10/29/20 0819  . naltrexone (DEPADE) tablet 50 mg  50 mg Oral Daily [name], MD   50 mg at 10/29/20 0820  . nicotine (NICODERM CQ - dosed in mg/24 hours) patch 21 mg  21 mg Transdermal Daily [name], [name], MD   21 mg at 10/29/20 0818  . pneumococcal 23 valent vaccine (PNEUMOVAX-23) injection 0.5 mL  0.5 mL Intramuscular Tomorrow-1000 [name], MD      . risperiDONE (RISPERDAL M-TABS) disintegrating tablet 2 mg  2 mg Oral Daily [name], MD   2 mg at 10/29/20 7564  . risperiDONE (RISPERDAL M-TABS) disintegrating tablet 4 mg  4 mg Oral QHS [name], MD      . thiamine tablet 100 mg  100 mg Oral Daily [name], MD   100 mg at 10/29/20 3329  . traZODone (DESYREL) tablet 50 mg  50 mg Oral QHS PRN [name], [name], MD   50 mg at 10/23/20 2202    Lab Results: No results found for this or any previous visit (from the past 48 hour(s)).  Blood Alcohol level:  Lab Results  Component Value Date   ETH <10 10/18/2020    Metabolic Disorder Labs: Lab Results  Component Value Date   HGBA1C 5.6 10/20/2020   MPG 114.02 10/20/2020   No results found for: PROLACTIN Lab Results  Component Value Date   CHOL 184 10/20/2020   TRIG 143 10/20/2020   HDL 51 10/20/2020   CHOLHDL 3.6 10/20/2020   VLDL 29 10/20/2020   LDLCALC 104 (H) 10/20/2020    Physical Findings: AIMS: Facial and Oral Movements Muscles of Facial Expression: None, normal Lips and Perioral Area: None, normal Jaw: None, normal Tongue: None, normal,Extremity Movements Upper (arms, wrists, hands, fingers): None, normal Lower (legs, knees, ankles, toes): None, normal, Trunk Movements Neck, shoulders, hips: None, normal, Overall Severity Severity of abnormal movements (highest  score from questions above): None, normal Incapacitation due to abnormal movements: None, normal Patient's awareness of abnormal movements (rate only patient's report): No Awareness, Dental Status Current problems with teeth and/or dentures?: No Does patient usually wear dentures?: No  CIWA:  CIWA-Ar Total: 4 COWS:  COWS Total Score: 1  Musculoskeletal: Strength & Muscle Tone: within normal limits Gait & Station: normal Patient leans: N/A  Psychiatric Specialty Exam:  Presentation  General  Appearance: Disheveled  Eye Contact:Minimal  Speech:Blocked  Speech Volume:Decreased  Handedness:Right   Mood and Affect  Mood:Irritable  Affect:Congruent   Thought Process  Thought Processes:Goal Directed  Descriptions of Associations:Loose  Orientation:Partial  Thought Content:Delusions; Illogical; Paranoid Ideation  History of Schizophrenia/Schizoaffective disorder:No  Duration of Psychotic Symptoms:Less than six months  Hallucinations:No data recorded  Ideas of Reference:Percusatory  Suicidal Thoughts:No data recorded  Homicidal Thoughts:No data recorded   Sensorium  Memory:Immediate Fair; Recent Poor; Remote Poor  Judgment:Impaired  Insight:Shallow   Executive Functions  Concentration:Poor  Attention Span:Poor  Recall:Poor  Fund of Knowledge:Fair  Language:Fair   Psychomotor Activity  Psychomotor Activity:No data recorded   Assets  Assets:Desire for Improvement; Physical Health   Sleep  Sleep: Poor, 1.45   Physical Exam: Physical Exam  ROS  Blood pressure 115/76, pulse 63, temperature 97.7 F (36.5 C), temperature source Oral, resp. rate 17, height 5\' 4"  (1.626 m), weight 47.6 kg, SpO2 100 %. Body mass index is 18.02 kg/m.   Treatment Plan Summary: Daily contact with patient to assess and evaluate symptoms and progress in treatment and Medication management 45 year old female with MDD, recurrent, severe, with psychotic features.  She continues to have command auditory hallucinations and suicidal ideations. She contracts for safety in the hospital stating, "Well, there's nothing in here to kill myself with." Symptoms of catatonia still present. She reports that the voices are somewhat quieter than on admission. Continue Celexa 20 mg daily for mood. Continue Ativan 2 mg TID for catatonia. Continue risperdal 2 mg in the morning, 4 mg in the evening for psychosis. Continue Naltrexone 50 mg daily for alcohol use disorder, nicoderm 21 mg patch daily for tobacco use disorder.   10/29/20: Psychiatric exam above reviewed and remains accurate. Assessment and plan above reviewed and updated.    10/31/20, MD 10/29/2020, 10:36 AM

## 2020-10-29 NOTE — Plan of Care (Signed)
  Problem: Self-Concept: Goal: Ability to identify factors that promote anxiety will improve Outcome: Not Progressing Goal: Level of anxiety will decrease Outcome: Progressing   Problem: Education: Goal: Knowledge of the prescribed therapeutic regimen will improve Outcome: Not Progressing   Problem: Activity: Goal: Interest or engagement in leisure activities will improve Outcome: Not Progressing   Problem: Health Behavior/Discharge Planning: Goal: Compliance with therapeutic regimen will improve Outcome: Not Progressing   Problem: Safety: Goal: Ability to disclose and discuss suicidal ideas will improve Outcome: Progressing

## 2020-10-29 NOTE — Progress Notes (Signed)
Patient is progressing. She seems brighter today and is observed in the day room eating meals. She is medication compliant and interacts appropriately with staff. She denies SI, HI, and AVH. I did not observe her having delusions of being arrested today. Will continue to monitor with 15 minute safety checks.

## 2020-10-29 NOTE — Progress Notes (Signed)
Patient continues to be preoccupied. Patient out of room once this shift requesting paper bag. Writer directed pt to med room to administer pm medication. Patient agreeable but suspicious. Meds given to pt. Patient then takes tiny sip of water and starts to exit med room. Writer stops patient and ask to see in mouth If meds had dissolved. Meds still in mouth. Writer asks that patient wait with her until all meds dissolved. Patient again attempted to leave with meds in mouth. Meds are dissolving but still visible. Patient then spits remaining pills in trash can and states, Im not suppose to take these meds. Patient went back to room. No other concerns or complaints voiced.

## 2020-10-30 NOTE — Tx Team (Addendum)
Interdisciplinary Treatment and Diagnostic Plan Update  10/30/2020 Time of Session: 8:30AM Cristina Finley MRN: [id number]  Principal Diagnosis: MDD (major depressive disorder), recurrent, severe, with psychosis (HCC)  Secondary Diagnoses: Principal Problem:   MDD (major depressive disorder), recurrent, severe, with psychosis (HCC) Active Problems:   Alcohol abuse   Nicotine abuse   Current Medications:  Current Facility-Administered Medications  Medication Dose Route Frequency Provider Last Rate Last Admin  . acetaminophen (TYLENOL) tablet 650 mg  650 mg Oral Q6H PRN [name], [name], MD      . alum & mag hydroxide-simeth (MAALOX/MYLANTA) 200-200-20 MG/5ML suspension 30 mL  30 mL Oral Q4H PRN [name], [name], MD      . citalopram (CELEXA) tablet 20 mg  20 mg Oral Daily [name], [name], MD   20 mg at 10/30/20 0820  . feeding supplement (ENSURE ENLIVE / ENSURE PLUS) liquid 237 mL  237 mL Oral BID BM [name], MD   237 mL at 10/30/20 0919  . guaiFENesin (ROBITUSSIN) 100 MG/5ML solution 200 mg  200 mg Oral Q4H PRN [name], MD   200 mg at 10/23/20 0800  . hydrOXYzine (ATARAX/VISTARIL) tablet 25 mg  25 mg Oral TID PRN [name], [name], MD   25 mg at 10/20/20 1352  . LORazepam (ATIVAN) tablet 2 mg  2 mg Oral TID [name], MD   2 mg at 10/30/20 0820  . magnesium hydroxide (MILK OF MAGNESIA) suspension 30 mL  30 mL Oral Daily PRN [name], [name], MD      . multivitamin with minerals tablet 1 tablet  1 tablet Oral Daily [name], MD   1 tablet at 10/30/20 0819  . naltrexone (DEPADE) tablet 50 mg  50 mg Oral Daily [name], MD   50 mg at 10/30/20 0919  . nicotine (NICODERM CQ - dosed in mg/24 hours) patch 21 mg  21 mg Transdermal Daily [name], [name], MD   21 mg at 10/29/20 0818  . pneumococcal 23 valent vaccine (PNEUMOVAX-23) injection 0.5 mL  0.5 mL Intramuscular Tomorrow-1000 [name], MD      . risperiDONE (RISPERDAL M-TABS) disintegrating tablet  2 mg  2 mg Oral Daily [name], MD   2 mg at 10/30/20 7564  . risperiDONE (RISPERDAL M-TABS) disintegrating tablet 4 mg  4 mg Oral QHS [name], MD      . thiamine tablet 100 mg  100 mg Oral Daily [name], MD   100 mg at 10/30/20 0800  . traZODone (DESYREL) tablet 50 mg  50 mg Oral QHS PRN [name], [name], MD   50 mg at 10/23/20 2202   PTA Medications: Medications Prior to Admission  Medication Sig Dispense Refill Last Dose  . citalopram (CELEXA) 20 MG tablet Take 20 mg by mouth daily.       Patient Stressors: Marital or family conflict Substance abuse  Patient Strengths: Barrister's clerk for treatment/growth  Treatment Modalities: Medication Management, Group therapy, Case management,  1 to 1 session with clinician, Psychoeducation, Recreational therapy.   Physician Treatment Plan for Primary Diagnosis: MDD (major depressive disorder), recurrent, severe, with psychosis (HCC) Long Term Goal(s): Improvement in symptoms so as ready for discharge Improvement in symptoms so as ready for discharge   Short Term Goals: Ability to identify changes in lifestyle to reduce recurrence of condition will improve Ability to verbalize feelings will improve Ability to disclose and discuss suicidal ideas Ability to demonstrate  self-control will improve Ability to identify and develop effective coping behaviors will improve Ability to maintain clinical measurements within normal limits will improve Compliance with prescribed medications will improve Ability to identify triggers associated with substance abuse/mental health issues will improve Ability to identify changes in lifestyle to reduce recurrence of condition will improve Ability to verbalize feelings will improve Ability to disclose and discuss suicidal ideas Ability to demonstrate self-control will improve Ability to identify and develop effective coping behaviors will improve Ability to maintain  clinical measurements within normal limits will improve Compliance with prescribed medications will improve Ability to identify triggers associated with substance abuse/mental health issues will improve  Medication Management: Evaluate patient's response, side effects, and tolerance of medication regimen.  Therapeutic Interventions: 1 to 1 sessions, Unit Group sessions and Medication administration.  Evaluation of Outcomes: Progressing  Physician Treatment Plan for Secondary Diagnosis: Principal Problem:   MDD (major depressive disorder), recurrent, severe, with psychosis (HCC) Active Problems:   Alcohol abuse   Nicotine abuse  Long Term Goal(s): Improvement in symptoms so as ready for discharge Improvement in symptoms so as ready for discharge   Short Term Goals: Ability to identify changes in lifestyle to reduce recurrence of condition will improve Ability to verbalize feelings will improve Ability to disclose and discuss suicidal ideas Ability to demonstrate self-control will improve Ability to identify and develop effective coping behaviors will improve Ability to maintain clinical measurements within normal limits will improve Compliance with prescribed medications will improve Ability to identify triggers associated with substance abuse/mental health issues will improve Ability to identify changes in lifestyle to reduce recurrence of condition will improve Ability to verbalize feelings will improve Ability to disclose and discuss suicidal ideas Ability to demonstrate self-control will improve Ability to identify and develop effective coping behaviors will improve Ability to maintain clinical measurements within normal limits will improve Compliance with prescribed medications will improve Ability to identify triggers associated with substance abuse/mental health issues will improve     Medication Management: Evaluate patient's response, side effects, and tolerance of  medication regimen.  Therapeutic Interventions: 1 to 1 sessions, Unit Group sessions and Medication administration.  Evaluation of Outcomes: Progressing   RN Treatment Plan for Primary Diagnosis: MDD (major depressive disorder), recurrent, severe, with psychosis (HCC) Long Term Goal(s): Knowledge of disease and therapeutic regimen to maintain health will improve  Short Term Goals: Ability to remain free from injury will improve, Ability to demonstrate self-control, Ability to participate in decision making will improve, Ability to verbalize feelings will improve, Ability to disclose and discuss suicidal ideas, Ability to identify and develop effective coping behaviors will improve and Compliance with prescribed medications will improve  Medication Management: RN will administer medications as ordered by provider, will assess and evaluate patient's response and provide education to patient for prescribed medication. RN will report any adverse and/or side effects to prescribing provider.  Therapeutic Interventions: 1 on 1 counseling sessions, Psychoeducation, Medication administration, Evaluate responses to treatment, Monitor vital signs and CBGs as ordered, Perform/monitor CIWA, COWS, AIMS and Fall Risk screenings as ordered, Perform wound care treatments as ordered.  Evaluation of Outcomes: Progressing   LCSW Treatment Plan for Primary Diagnosis: MDD (major depressive disorder), recurrent, severe, with psychosis (HCC) Long Term Goal(s): Safe transition to appropriate next level of care at discharge, Engage patient in therapeutic group addressing interpersonal concerns.  Short Term Goals: Engage patient in aftercare planning with referrals and resources, Increase social support, Increase ability to appropriately verbalize feelings, Increase emotional regulation,  Facilitate acceptance of mental health diagnosis and concerns, Facilitate patient progression through stages of change regarding  substance use diagnoses and concerns, Identify triggers associated with mental health/substance abuse issues and Increase skills for wellness and recovery  Therapeutic Interventions: Assess for all discharge needs, 1 to 1 time with Social worker, Explore available resources and support systems, Assess for adequacy in community support network, Educate family and significant other(s) on suicide prevention, Complete Psychosocial Assessment, Interpersonal group therapy.  Evaluation of Outcomes: Progressing   Progress in Treatment: Attending groups: No. Participating in groups: No. Taking medication as prescribed: Yes. Toleration medication: Yes. Family/Significant other contact made: No, will contact:  completed with pt Patient understands diagnosis: Yes. Discussing patient identified problems/goals with staff: Yes. Medical problems stabilized or resolved: Yes. Denies suicidal/homicidal ideation: Yes. Issues/concerns per patient self-inventory: No. Other: None.  New problem(s) identified: No, Describe:  none.  New Short Term/Long Term Goal(s): detox, elimination of symptoms of psychosis, medication management for mood stabilization; elimination of SI thoughts; development of comprehensive mental wellness/sobriety plan. Update 10/25/20: No changes at this time. Update 10/30/20: Patient was declining and not ingesting some of her medications.   Patient Goals: "I just want to get these thoughts out of my head." Update 10/25/20: No changes at this time. Update 10/30/20: No changes at this time.   Discharge Plan or Barriers: CSW will assist pt with development of an appropriate aftercare//discharge plan. Pt to be given information regarding residential/inpt substance use treatment. Update 10/25/20: No changes at this time. Update 10/30/20: No changes at this time.   Reason for Continuation of Hospitalization: Depression Hallucinations Medication stabilization Withdrawal symptoms  Estimated  Length of Stay: TBD  Attendees: Patient:  10/30/2020 2:41 PM  Physician: Reggie Pile, MD 10/30/2020 2:41 PM  Nursing:  10/30/2020 2:41 PM  RN Care Manager: 10/30/2020 2:41 PM  Social Worker:  10/30/2020 2:41 PM  Recreational Therapist:  10/30/2020 2:41 PM  Other: Izick Gasbarro [place], MSW, LCSW-A 10/30/2020 2:41 PM  Other:  10/30/2020 2:41 PM  Other: 10/30/2020 2:41 PM    Scribe for Treatment Team: Adelma Bowdoin A [place], LCSWA 10/30/2020 2:41 PM

## 2020-10-30 NOTE — [facility] Group Notes (Signed)
LCSW Group Therapy Note     10/30/2020 2:19 PM     Type of Therapy and Topic:  Group Therapy: Avoiding Self-Sabotaging and Enabling Behaviors     Participation Level:  Did Not Attend        Description of Group:   In this group, patients will learn how to identify obstacles, self-sabotaging and enabling behaviors, as well as: what are they, why do we do them and what needs these behaviors meet. Discuss unhealthy relationships and how to have positive healthy boundaries with those that sabotage and enable. Explore aspects of self-sabotage and enabling in yourself and how to limit these self-destructive behaviors in everyday life.      Therapeutic Goals:  1.                  Patient will identify one obstacle that relates to self-sabotage and enabling behaviors  2.                  Patient will identify one personal self-sabotaging or enabling behavior they did prior to admission  3.                  Patient will state a plan to change the above identified behavior  4.                  Patient will demonstrate ability to communicate their needs through discussion and/or role play.     Summary of Patient Progress:  X  Therapeutic Modalities:   Cognitive Behavioral Therapy  Person-Centered Therapy  Motivational Interviewing    Melba Araki [place] MSW, LCSW-A  10/30/2020 2:19 PM

## 2020-10-30 NOTE — Progress Notes (Signed)
Patient alert and oriented x 4 her thoughts are organized and coherent,15 minutes safety checks maintained, will continue to monitor

## 2020-10-30 NOTE — [facility] Group Notes (Signed)
[facility] Group Notes:  (Nursing/MHT/Case Management/Adjunct)  Date:  10/30/2020  Time:  8:59 PM  Type of Therapy:  Group Therapy  Participation Level:  Did Not Attend   Mayra Neer 10/30/2020, 8:59 PM

## 2020-10-30 NOTE — Plan of Care (Signed)
Patient is more visible in the milieu and more cooperative. Alert and oriented and denying SI/HI. She continues to endorse depressive symptoms and reporting that she does not want to take "that many medications". She becomes irritable when its time to take medications. Appetite slightly improving. Encouragements and support provided and safety maintained.

## 2020-10-30 NOTE — Progress Notes (Signed)
[facility] MD Progress Note  10/30/2020 9:34 AM Cristina Finley  MRN:  [id number]   CC "I'm losing time."   Subjective: Cristina Finley has been seen today in the office. Tells me that her depression and severe, 10 out of 10. States that she has never been this low before in her life. She reports feeling better in some ways, particularly that her auditory hallucinations have subsided. Yet, later states they are off and on.   Her anxiety is high at 7 out of 10 with 10 being high. Says that she has no closure about the divorce itself as he didn't even tell her about it. We explored this in bed.  States that she is feeling tired and has no energy.  Reports that she estrange from her kids for cheerful about this  Patient reports that she is unsure when she goes to sleep at night but believes she wakes up around 9 PM and goes back to sleep one hour.   Lives with her father.   Does not know what day of the week it is.   Principal Problem: MDD (major depressive disorder), recurrent, severe, with psychosis (HCC) Diagnosis: Principal Problem:   MDD (major depressive disorder), recurrent, severe, with psychosis (HCC) Active Problems:   Alcohol abuse   Nicotine abuse  Total Time spent with patient: 27 minutes  Past Psychiatric History: See H&P  Past Medical History: History reviewed. No pertinent past medical history. History reviewed. No pertinent surgical history. Family History: History reviewed. No pertinent family history. Family Psychiatric  History: See H&P Social History:  Social History   Substance and Sexual Activity  Alcohol Use Yes  . Alcohol/week: 6.0 standard drinks  . Types: 6 Cans of beer per week   Comment: one 6 pack daily     Social History   Substance and Sexual Activity  Drug Use Yes  . Types: Marijuana   Comment: last used 2 days ago per pt    Social History   Socioeconomic History  . Marital status: Married    Spouse name: Not on file  . Number of children: Not on  file  . Years of education: Not on file  . Highest education level: Not on file  Occupational History  . Not on file  Tobacco Use  . Smoking status: Current Every Day Smoker    Packs/day: 1.50    Types: Cigarettes  . Smokeless tobacco: Never Used  Vaping Use  . Vaping Use: Never used  Substance and Sexual Activity  . Alcohol use: Yes    Alcohol/week: 6.0 standard drinks    Types: 6 Cans of beer per week    Comment: one 6 pack daily  . Drug use: Yes    Types: Marijuana    Comment: last used 2 days ago per pt  . Sexual activity: Yes    Partners: Male    Birth control/protection: Surgical  Other Topics Concern  . Not on file  Social History Narrative  . Not on file   Social Determinants of Health   Financial Resource Strain: Not on file  Food Insecurity: Not on file  Transportation Needs: Not on file  Physical Activity: Not on file  Stress: Not on file  Social Connections: Not on file   Additional Social History:                         Sleep: Poor  Appetite:  Poor  Current Medications: Current Facility-Administered Medications  Medication  Dose Route Frequency Provider Last Rate Last Admin  . acetaminophen (TYLENOL) tablet 650 mg  650 mg Oral Q6H PRN [name], [name], MD      . alum & mag hydroxide-simeth (MAALOX/MYLANTA) 200-200-20 MG/5ML suspension 30 mL  30 mL Oral Q4H PRN [name], [name], MD      . citalopram (CELEXA) tablet 20 mg  20 mg Oral Daily [name], [name], MD   20 mg at 10/30/20 0820  . feeding supplement (ENSURE ENLIVE / ENSURE PLUS) liquid 237 mL  237 mL Oral BID BM [name], MD   237 mL at 10/30/20 0919  . guaiFENesin (ROBITUSSIN) 100 MG/5ML solution 200 mg  200 mg Oral Q4H PRN [name], MD   200 mg at 10/23/20 0800  . hydrOXYzine (ATARAX/VISTARIL) tablet 25 mg  25 mg Oral TID PRN [name], [name], MD   25 mg at 10/20/20 1352  . LORazepam (ATIVAN) tablet 2 mg  2 mg Oral TID [name], MD   2 mg at 10/30/20 0820  .  magnesium hydroxide (MILK OF MAGNESIA) suspension 30 mL  30 mL Oral Daily PRN [name], [name], MD      . multivitamin with minerals tablet 1 tablet  1 tablet Oral Daily [name], MD   1 tablet at 10/30/20 0819  . naltrexone (DEPADE) tablet 50 mg  50 mg Oral Daily [name], MD   50 mg at 10/30/20 0919  . nicotine (NICODERM CQ - dosed in mg/24 hours) patch 21 mg  21 mg Transdermal Daily [name], [name], MD   21 mg at 10/29/20 0818  . pneumococcal 23 valent vaccine (PNEUMOVAX-23) injection 0.5 mL  0.5 mL Intramuscular Tomorrow-1000 [name], MD      . risperiDONE (RISPERDAL M-TABS) disintegrating tablet 2 mg  2 mg Oral Daily [name], MD   2 mg at 10/30/20 9371  . risperiDONE (RISPERDAL M-TABS) disintegrating tablet 4 mg  4 mg Oral QHS [name], MD      . thiamine tablet 100 mg  100 mg Oral Daily [name], MD   100 mg at 10/29/20 6967  . traZODone (DESYREL) tablet 50 mg  50 mg Oral QHS PRN [name], [name], MD   50 mg at 10/23/20 2202    Lab Results: No results found for this or any previous visit (from the past 48 hour(s)).  Blood Alcohol level:  Lab Results  Component Value Date   ETH <10 10/18/2020    Metabolic Disorder Labs: Lab Results  Component Value Date   HGBA1C 5.6 10/20/2020   MPG 114.02 10/20/2020   No results found for: PROLACTIN Lab Results  Component Value Date   CHOL 184 10/20/2020   TRIG 143 10/20/2020   HDL 51 10/20/2020   CHOLHDL 3.6 10/20/2020   VLDL 29 10/20/2020   LDLCALC 104 (H) 10/20/2020    Physical Findings: AIMS: Facial and Oral Movements Muscles of Facial Expression: None, normal Lips and Perioral Area: None, normal Jaw: None, normal Tongue: None, normal,Extremity Movements Upper (arms, wrists, hands, fingers): None, normal Lower (legs, knees, ankles, toes): None, normal, Trunk Movements Neck, shoulders, hips: None, normal, Overall Severity Severity of abnormal movements (highest score from questions  above): None, normal Incapacitation due to abnormal movements: None, normal Patient's awareness of abnormal movements (rate only patient's report): No Awareness, Dental Status Current problems with teeth and/or dentures?: No Does patient usually wear dentures?: No  CIWA:  CIWA-Ar Total:  4 COWS:  COWS Total Score: 1  Musculoskeletal: Strength & Muscle Tone: within normal limits Gait & Station: normal Patient leans: N/A  Psychiatric Specialty Exam:  Presentation  General Appearance: Disheveled  Eye Contact:Minimal  Speech: wnl today  Speech Volume:Decreased  Handedness:Right   Mood and Affect  Mood:depressed  Affect: congruent  Thought Process  Thought Processes:Goal Directed  Descriptions of Associations:Loose  Orientation:Partial  Thought Content:Delusions; Illogical; Paranoid Ideation  History of Schizophrenia/Schizoaffective disorder:No  Duration of Psychotic Symptoms:Less than six months  Hallucinations:No data recorded  Ideas of Reference:Percusatory  Suicidal Thoughts:No data recorded  Homicidal Thoughts:No data recorded   Sensorium  Memory:Immediate Fair; Recent Poor; Remote Poor  Judgment:Impaired  Insight:Shallow   Executive Functions  Concentration:Poor  Attention Span:Poor  Recall:Poor  Fund of Knowledge:Fair  Language:Fair   Psychomotor Activity  Psychomotor Activity:No data recorded   Assets  Assets:Desire for Improvement; Physical Health   Sleep  Sleep: Poor, 1.45   Physical Exam: Physical Exam ROS Blood pressure 102/72, pulse 86, temperature (!) 97.4 F (36.3 C), temperature source Oral, resp. rate 17, height 5\' 4"  (1.626 m), weight 47.6 kg, SpO2 100 %. Body mass index is 18.02 kg/m.   Treatment Plan Summary: Daily contact with patient to assess and evaluate symptoms and progress in treatment and Medication management 45 year old female with MDD, recurrent, severe, with psychotic features. She continues to  have command auditory hallucinations and suicidal ideations. She contracts for safety in the hospital stating, "Well, there's nothing in here to kill myself with." Symptoms of catatonia still present. She reports that the voices are somewhat quieter than on admission. Continue Celexa 20 mg daily for mood. Continue Ativan 2 mg TID for catatonia. Continue risperdal 2 mg in the morning, 4 mg in the evening for psychosis. Continue Naltrexone 50 mg daily for alcohol use disorder, nicoderm 21 mg patch daily for tobacco use disorder.     10/29/20: Psychiatric exam above reviewed and remains accurate. Assessment and plan above reviewed and updated.   10/30/2020 No signs of EPS, NMS   11/01/2020, MD 10/30/2020, 9:34 AM

## 2020-10-31 NOTE — Progress Notes (Signed)
Patient did not come to medication room to get QHS meds. Had to be prompted to get out of bed to take her medications. Presents flat and depressed. She received her QHS meds and tolerated without incident.  She denies having and AVH symptoms. She does endorse depression and anxiety but minimizes stating her levels at 3/10.  She continues to isolate to her room and have minimal interactions with both staff and peers. Will continue to monitor with 15 minute safety checks.  Encouraged her to come to staff with any concerns.    Cleo Butler-Nicholson, LPN

## 2020-10-31 NOTE — Progress Notes (Signed)
[facility] MD Progress Note  10/31/2020 9:13 AM Cristina Finley  MRN:  [id number]   CC "depressed."  Subjective: 5/22 Patient was seen sitting in her room in the dark. Reports high-grade depression but no thoughts of suicide. She speaks spontaneously. She appears to be in deep contemplation but shares a limited amount. Still report that she is "losing time" and can not remember the things that she did even yesterday. Reports a history of insignificant memory problems in the past. She did sleep well last night. Says that she has no idea what keeps her going. Says that she has tried to read and engage in other things but just cannot. Appetite is poor.  5/21 Cristina Finley has been seen today in the office. Tells me that her depression and severe, 10 out of 10. States that she has never been this low before in her life. She reports feeling better in some ways, particularly that her auditory hallucinations have subsided. Yet, later states they are off and on.   Her anxiety is high at 7 out of 10 with 10 being high. Says that she has no closure about the divorce itself as he didn't even tell her about it. We explored this in bed.  States that she is feeling tired and has no energy.  Reports that she estrange from her kids for cheerful about this  Patient reports that she is unsure when she goes to sleep at night but believes she wakes up around 9 PM and goes back to sleep one hour.   Lives with her father.   Does not know what day of the week it is.   Principal Problem: MDD (major depressive disorder), recurrent, severe, with psychosis (HCC) Diagnosis: Principal Problem:   MDD (major depressive disorder), recurrent, severe, with psychosis (HCC) Active Problems:   Alcohol abuse   Nicotine abuse  Total Time spent with patient: 29 minutes  Past Psychiatric History: See H&P  Past Medical History: History reviewed. No pertinent past medical history. History reviewed. No pertinent surgical history. Family  History: History reviewed. No pertinent family history. Family Psychiatric  History: See H&P Social History:  Social History   Substance and Sexual Activity  Alcohol Use Yes  . Alcohol/week: 6.0 standard drinks  . Types: 6 Cans of beer per week   Comment: one 6 pack daily     Social History   Substance and Sexual Activity  Drug Use Yes  . Types: Marijuana   Comment: last used 2 days ago per pt    Social History   Socioeconomic History  . Marital status: Married    Spouse name: Not on file  . Number of children: Not on file  . Years of education: Not on file  . Highest education level: Not on file  Occupational History  . Not on file  Tobacco Use  . Smoking status: Current Every Day Smoker    Packs/day: 1.50    Types: Cigarettes  . Smokeless tobacco: Never Used  Vaping Use  . Vaping Use: Never used  Substance and Sexual Activity  . Alcohol use: Yes    Alcohol/week: 6.0 standard drinks    Types: 6 Cans of beer per week    Comment: one 6 pack daily  . Drug use: Yes    Types: Marijuana    Comment: last used 2 days ago per pt  . Sexual activity: Yes    Partners: Male    Birth control/protection: Surgical  Other Topics Concern  . Not on file  Social History Narrative  . Not on file   Social Determinants of Health   Financial Resource Strain: Not on file  Food Insecurity: Not on file  Transportation Needs: Not on file  Physical Activity: Not on file  Stress: Not on file  Social Connections: Not on file   Additional Social History:                         Sleep: Poor  Appetite:  Poor  Current Medications: Current Facility-Administered Medications  Medication Dose Route Frequency Provider Last Rate Last Admin  . acetaminophen (TYLENOL) tablet 650 mg  650 mg Oral Q6H PRN [name], [name], MD      . alum & mag hydroxide-simeth (MAALOX/MYLANTA) 200-200-20 MG/5ML suspension 30 mL  30 mL Oral Q4H PRN [name], [name], MD      . citalopram (CELEXA)  tablet 20 mg  20 mg Oral Daily [name], [name], MD   20 mg at 10/31/20 0757  . feeding supplement (ENSURE ENLIVE / ENSURE PLUS) liquid 237 mL  237 mL Oral BID BM [name], MD   237 mL at 10/30/20 1450  . guaiFENesin (ROBITUSSIN) 100 MG/5ML solution 200 mg  200 mg Oral Q4H PRN [name], MD   200 mg at 10/23/20 0800  . hydrOXYzine (ATARAX/VISTARIL) tablet 25 mg  25 mg Oral TID PRN [name], [name], MD   25 mg at 10/20/20 1352  . LORazepam (ATIVAN) tablet 2 mg  2 mg Oral TID [name], MD   2 mg at 10/31/20 0757  . magnesium hydroxide (MILK OF MAGNESIA) suspension 30 mL  30 mL Oral Daily PRN [name], [name], MD      . multivitamin with minerals tablet 1 tablet  1 tablet Oral Daily [name], MD   1 tablet at 10/31/20 0757  . naltrexone (DEPADE) tablet 50 mg  50 mg Oral Daily [name], MD   50 mg at 10/31/20 0757  . nicotine (NICODERM CQ - dosed in mg/24 hours) patch 21 mg  21 mg Transdermal Daily [name], [name], MD   21 mg at 10/29/20 0818  . pneumococcal 23 valent vaccine (PNEUMOVAX-23) injection 0.5 mL  0.5 mL Intramuscular Tomorrow-1000 [name], MD      . risperiDONE (RISPERDAL M-TABS) disintegrating tablet 2 mg  2 mg Oral Daily [name], MD   2 mg at 10/31/20 0757  . risperiDONE (RISPERDAL M-TABS) disintegrating tablet 4 mg  4 mg Oral QHS [name], MD   4 mg at 10/30/20 2121  . thiamine tablet 100 mg  100 mg Oral Daily [name], MD   100 mg at 10/31/20 0757  . traZODone (DESYREL) tablet 50 mg  50 mg Oral QHS PRN [name], [name], MD   50 mg at 10/23/20 2202    Lab Results: No results found for this or any previous visit (from the past 48 hour(s)).  Blood Alcohol level:  Lab Results  Component Value Date   ETH <10 10/18/2020    Metabolic Disorder Labs: Lab Results  Component Value Date   HGBA1C 5.6 10/20/2020   MPG 114.02 10/20/2020   No results found for: PROLACTIN Lab Results  Component Value Date   CHOL 184  10/20/2020   TRIG 143 10/20/2020   HDL 51 10/20/2020   CHOLHDL 3.6 10/20/2020   VLDL 29 10/20/2020   LDLCALC 104 (H) 10/20/2020    Physical  Findings: AIMS: Facial and Oral Movements Muscles of Facial Expression: None, normal Lips and Perioral Area: None, normal Jaw: None, normal Tongue: None, normal,Extremity Movements Upper (arms, wrists, hands, fingers): None, normal Lower (legs, knees, ankles, toes): None, normal, Trunk Movements Neck, shoulders, hips: None, normal, Overall Severity Severity of abnormal movements (highest score from questions above): None, normal Incapacitation due to abnormal movements: None, normal Patient's awareness of abnormal movements (rate only patient's report): No Awareness, Dental Status Current problems with teeth and/or dentures?: No Does patient usually wear dentures?: No  CIWA:  CIWA-Ar Total: 4 COWS:  COWS Total Score: 1  Musculoskeletal: Strength & Muscle Tone: within normal limits Gait & Station: normal Patient leans: N/A  Psychiatric Specialty Exam:  Presentation  General Appearance: Disheveled  Eye Contact:Minimal  Speech: wnl today  Speech Volume:Decreased  Handedness:Right   Mood and Affect  Mood:depressed  Affect: congruent  Thought Process  Thought Processes:Goal Directed  Descriptions of Associations:Loose  Orientation:AAOx2  Thought Content:Delusions; Illogical; Paranoid Ideation  History of Schizophrenia/Schizoaffective disorder:No  Duration of Psychotic Symptoms:Less than six months  Hallucinations:No data recorded  Ideas of Reference:Percusatory  Suicidal Thoughts:No data recorded  Homicidal Thoughts:No data recorded   Sensorium  Memory:Immediate Fair; Recent Poor; Remote Poor  Judgment:Impaired  Insight:Shallow   Executive Functions  Concentration:Poor  Attention Span:Poor  Recall:Poor  Fund of Knowledge:Fair  Language:Fair   Psychomotor Activity  Psychomotor Activity:No data  recorded   Assets  Assets:Desire for Improvement; Physical Health   Sleep  Sleep: Poor, 1.45   Physical Exam: Physical Exam ROS Blood pressure (!) 101/47, pulse 75, temperature (!) 97.4 F (36.3 C), temperature source Oral, resp. rate 18, height 5\' 4"  (1.626 m), weight 47.6 kg, SpO2 100 %. Body mass index is 18.02 kg/m.   Treatment Plan Summary: Daily contact with patient to assess and evaluate symptoms and progress in treatment and Medication management 45 year old female with MDD, recurrent, severe, with psychotic features. She continues to have command auditory hallucinations and suicidal ideations. She contracts for safety in the hospital stating, "Well, there's nothing in here to kill myself with." Symptoms of catatonia still present. She reports that the voices are somewhat quieter than on admission. Continue Celexa 20 mg daily for mood. Continue Ativan 2 mg TID for catatonia. Continue risperdal 2 mg in the morning, 4 mg in the evening for psychosis. Continue Naltrexone 50 mg daily for alcohol use disorder, nicoderm 21 mg patch daily for tobacco use disorder.     10/29/20: Psychiatric exam above reviewed and remains accurate. Assessment and plan above reviewed and updated.   10/30/20 No signs of EPS, NMS  10/31/20 Obtain folate and B12 levels Consider ECT TSH within normal limits Head CT was unremarkable  CPT: 11/02/20 41324, MD 10/31/2020, 9:13 AM

## 2020-10-31 NOTE — Progress Notes (Signed)
[facility] Group Notes: (Clinical Social Work)   10/31/2020      Type of Therapy:  Group Therapy   Participation Level:  Did Not Attend - was invited individually by Nurse/MHT and chose not to attend.   Susa Simmonds, LCSWA 10/31/2020  2:32 PM

## 2020-10-31 NOTE — Plan of Care (Addendum)
  Problem: Activity: Goal: Interest or engagement in leisure activities will improve Outcome: Progressing Goal: Imbalance in normal sleep/wake cycle will improve Outcome: Progressing   Problem: Education: Goal: Utilization of techniques to improve thought processes will improve Outcome: Progressing Goal: Knowledge of the prescribed therapeutic regimen will improve Outcome: Progressing   Problem: Self-Concept: Goal: Ability to identify factors that promote anxiety will improve Outcome: Progressing Goal: Level of anxiety will decrease Outcome: Progressing Goal: Ability to modify response to factors that promote anxiety will improve Outcome: Progressing   Problem: Coping: Goal: Ability to identify and develop effective coping behavior will improve Outcome: Progressing   Problem: Education: Goal: Ability to state activities that reduce stress will improve Outcome: Progressing   Problem: Self-Concept: Goal: Will verbalize positive feelings about self Outcome: Progressing Goal: Level of anxiety will decrease Outcome: Progressing

## 2020-10-31 NOTE — Plan of Care (Signed)
  Problem: Self-Concept: Goal: Level of anxiety will decrease Outcome: Progressing   Problem: Activity: Goal: Imbalance in normal sleep/wake cycle will improve Outcome: Progressing   Problem: Coping: Goal: Coping ability will improve Outcome: Progressing   Problem: Role Relationship: Goal: Will demonstrate positive changes in social behaviors and relationships Outcome: Progressing   Problem: Coping: Goal: Ability to identify and develop effective coping behavior will improve Outcome: Progressing

## 2020-10-31 NOTE — Progress Notes (Signed)
Patient appears flat and very depressed on assessment. She denies SI, HI, and AVH but endorses depression. She does agree to come out of her room to get medications. Patient is medication compliant. Interaction is very minimal with staff. She isolates to her room and will come out for breakfast after other patients have left the dayroom. Patient remains safe on the unit at this time.

## 2020-10-31 NOTE — Progress Notes (Signed)
Patient is much better this evening. Came to get her QHS medications on her own without being prompted. Was med compliant and received her meds without incident.  Did not observe patient responding to internal stimuli this evening and patient denies SI/HI/AVH. Does endorse depression and anxiety.  Continues to isolate in room but does come out for food and snacks. She continues to be monitored with 15 minute safety checks and was encouraged to come to staff with any concerns.     Cleo Butler-Nicholson, LPN

## 2020-11-01 DIAGNOSIS — F333 Major depressive disorder, recurrent, severe with psychotic symptoms: Secondary | ICD-10-CM | POA: Diagnosis not present

## 2020-11-01 LAB — FOLATE: Folate: 12.3 ng/mL (ref >5.9)

## 2020-11-01 MED ORDER — OLANZAPINE 10 MG PO TBDP
5.0000 mg | ORAL_TABLET | Freq: Every day | ORAL | Status: DC
  Administered 2020-11-01: 5 mg via ORAL
  Filled 2020-11-01: qty 0.5
  Filled 2020-11-01: qty 1

## 2020-11-01 MED ORDER — LORAZEPAM 1 MG PO TABS
1.0000 mg | ORAL_TABLET | Freq: Three times a day (TID) | ORAL | Status: DC
  Administered 2020-11-01 – 2020-11-09 (×24): 1 mg via ORAL
  Filled 2020-11-01 (×24): qty 1

## 2020-11-01 MED ORDER — ESCITALOPRAM OXALATE 10 MG PO TABS
10.0000 mg | ORAL_TABLET | Freq: Every day | ORAL | Status: DC
  Administered 2020-11-01 – 2020-11-07 (×7): 10 mg via ORAL
  Filled 2020-11-01 (×8): qty 1

## 2020-11-01 MED ORDER — RISPERIDONE 1 MG PO TBDP
2.0000 mg | ORAL_TABLET | Freq: Every day | ORAL | Status: DC
  Administered 2020-11-01: 2 mg via ORAL
  Filled 2020-11-01: qty 2

## 2020-11-01 NOTE — Plan of Care (Signed)
Patient presents flat and depressed. Patient will come to medication room when asked to take her morning medications. She is tearful and interacts minimally. She denies SI, HI, and AVH. Patient isolates to her room, only coming out to take her medications when asked to and for meals after other patients have finished eating. Patient is medication compliant. She remains safe on the unit at this time.   Problem: Education: Goal: Utilization of techniques to improve thought processes will improve Outcome: Not Progressing   Problem: Activity: Goal: Interest or engagement in leisure activities will improve Outcome: Not Progressing   Problem: Coping: Goal: Coping ability will improve Outcome: Not Progressing Goal: Will verbalize feelings Outcome: Not Progressing

## 2020-11-01 NOTE — Plan of Care (Signed)
  Problem: Coping: Goal: Coping ability will improve Outcome: Progressing Goal: Will verbalize feelings Outcome: Progressing   Problem: Health Behavior/Discharge Planning: Goal: Ability to make decisions will improve Outcome: Progressing Goal: Compliance with therapeutic regimen will improve Outcome: Progressing   Problem: Coping: Goal: Coping ability will improve Outcome: Progressing Goal: Will verbalize feelings Outcome: Progressing

## 2020-11-01 NOTE — Progress Notes (Signed)
Recreation Therapy Notes   Date: 11/01/2020  Time: 9:30 am   Location: Craft room   Behavioral response: N/A   Intervention Topic: Goals    Discussion/Intervention: Patient did not attend group.   Clinical Observations/Feedback:  Patient did not attend group.   Laurielle Selmon LRT/CTRS        Saya Mccoll 11/01/2020 11:56 AM

## 2020-11-01 NOTE — Progress Notes (Signed)
Patient came out for snack and medication this evening. Was isolative but would engage in conversation when approached. She was med compliant and received her medication without incident. Denies SI/HI/AVH although observed earlier in the hall in her praying/arrest stance and continues to believe she is under arrest.  She presents flat and depressed in affect and appearance. She does endorse depression and anxiety, but again tries to minimize her symptoms stating "Im alright" "Im alright" in a soft low voice.  She was encouraged to come to staff with any concerns and continues to be monitored with Q15 minute safety rounds.    Cleo Butler-Nicholson, LPN

## 2020-11-01 NOTE — [facility] Group Notes (Signed)
LCSW Group Therapy Note   11/01/2020 2:26 PM  Type of Therapy and Topic:  Group Therapy:  Overcoming Obstacles   Participation Level:  Did Not Attend   Description of Group:    In this group patients will be encouraged to explore what they see as obstacles to their own wellness and recovery. They will be guided to discuss their thoughts, feelings, and behaviors related to these obstacles. The group will process together ways to cope with barriers, with attention given to specific choices patients can make. Each patient will be challenged to identify changes they are motivated to make in order to overcome their obstacles. This group will be process-oriented, with patients participating in exploration of their own experiences as well as giving and receiving support and challenge from other group members.   Therapeutic Goals: 1. Patient will identify personal and current obstacles as they relate to admission. 2. Patient will identify barriers that currently interfere with their wellness or overcoming obstacles.  3. Patient will identify feelings, thought process and behaviors related to these barriers. 4. Patient will identify two changes they are willing to make to overcome these obstacles:      Summary of Patient Progress X   Therapeutic Modalities:   Cognitive Behavioral Therapy Solution Focused Therapy Motivational Interviewing Relapse Prevention Therapy  Simona Huh R. Algis Greenhouse, MSW, LCSW, LCAS 11/01/2020 2:26 PM

## 2020-11-01 NOTE — Progress Notes (Signed)
Patient is observed to be in the hallway with her hands up in the air.

## 2020-11-01 NOTE — [facility] Group Notes (Signed)
[facility] Group Notes:  (Nursing/MHT/Case Management/Adjunct)  Date:  11/01/2020  Time:  9:32 PM  Type of Therapy:  Group Therapy  Participation Level:  Did Not Attend  Summary of Progress/Problems:  [place] Finley 11/01/2020, 9:32 PM

## 2020-11-01 NOTE — Progress Notes (Signed)
[facility] MD Progress Note  11/01/2020 11:02 AM [place] Finley  MRN:  [id number]   CC "IDepressed"  Subjective:  45 year old female with history of depression presenting for worsening mood and suicidal ideations. No acute events overnight,  ADLs impaired.  She continues to have command auditory hallucinations and suicidal ideations. Continues to feel that she is under arrest for "something terrible" and feels she is not allowed to eat. Minimal improvement with Risperdal or Abilify. Discussed ECT treatment, but patient currently declines.  Principal Problem: MDD (major depressive disorder), recurrent, severe, with psychosis (HCC) Diagnosis: Principal Problem:   MDD (major depressive disorder), recurrent, severe, with psychosis (HCC) Active Problems:   Alcohol abuse   Nicotine abuse  Total Time spent with patient: 30 minutes  Past Psychiatric History: See H&P  Past Medical History: History reviewed. No pertinent past medical history. History reviewed. No pertinent surgical history. Family History: History reviewed. No pertinent family history. Family Psychiatric  History: See H&P Social History:  Social History   Substance and Sexual Activity  Alcohol Use Yes  . Alcohol/week: 6.0 standard drinks  . Types: 6 Cans of beer per week   Comment: one 6 pack daily     Social History   Substance and Sexual Activity  Drug Use Yes  . Types: Marijuana   Comment: last used 2 days ago per pt    Social History   Socioeconomic History  . Marital status: Married    Spouse name: Not on file  . Number of children: Not on file  . Years of education: Not on file  . Highest education level: Not on file  Occupational History  . Not on file  Tobacco Use  . Smoking status: Current Every Day Smoker    Packs/day: 1.50    Types: Cigarettes  . Smokeless tobacco: Never Used  Vaping Use  . Vaping Use: Never used  Substance and Sexual Activity  . Alcohol use: Yes    Alcohol/week: 6.0 standard drinks     Types: 6 Cans of beer per week    Comment: one 6 pack daily  . Drug use: Yes    Types: Marijuana    Comment: last used 2 days ago per pt  . Sexual activity: Yes    Partners: Male    Birth control/protection: Surgical  Other Topics Concern  . Not on file  Social History Narrative  . Not on file   Social Determinants of Health   Financial Resource Strain: Not on file  Food Insecurity: Not on file  Transportation Needs: Not on file  Physical Activity: Not on file  Stress: Not on file  Social Connections: Not on file   Additional Social History:                         Sleep: Fair  Appetite:  Poor  Current Medications: Current Facility-Administered Medications  Medication Dose Route Frequency Provider Last Rate Last Admin  . acetaminophen (TYLENOL) tablet 650 mg  650 mg Oral Q6H PRN [name], [name], MD      . alum & mag hydroxide-simeth (MAALOX/MYLANTA) 200-200-20 MG/5ML suspension 30 mL  30 mL Oral Q4H PRN [name], [name], MD      . citalopram (CELEXA) tablet 20 mg  20 mg Oral Daily [name], [name], MD   20 mg at 11/01/20 0733  . feeding supplement (ENSURE ENLIVE / ENSURE PLUS) liquid 237 mL  237 mL Oral BID BM [name], MD  237 mL at 10/31/20 1433  . guaiFENesin (ROBITUSSIN) 100 MG/5ML solution 200 mg  200 mg Oral Q4H PRN [name], MD   200 mg at 10/23/20 0800  . hydrOXYzine (ATARAX/VISTARIL) tablet 25 mg  25 mg Oral TID PRN [name], [name], MD   25 mg at 10/20/20 1352  . LORazepam (ATIVAN) tablet 1 mg  1 mg Oral TID [name], MD      . magnesium hydroxide (MILK OF MAGNESIA) suspension 30 mL  30 mL Oral Daily PRN [name], [name], MD      . multivitamin with minerals tablet 1 tablet  1 tablet Oral Daily [name], MD   1 tablet at 11/01/20 0733  . naltrexone (DEPADE) tablet 50 mg  50 mg Oral Daily [name], MD   50 mg at 11/01/20 8003  . nicotine (NICODERM CQ - dosed in mg/24 hours) patch 21 mg  21 mg Transdermal Daily  [name], [name], MD   21 mg at 10/29/20 0818  . OLANZapine zydis (ZYPREXA) disintegrating tablet 5 mg  5 mg Oral QHS [name], MD      . pneumococcal 23 valent vaccine (PNEUMOVAX-23) injection 0.5 mL  0.5 mL Intramuscular Tomorrow-1000 [name], MD      . risperiDONE (RISPERDAL M-TABS) disintegrating tablet 2 mg  2 mg Oral Daily [name], MD   2 mg at 11/01/20 4917  . risperiDONE (RISPERDAL M-TABS) disintegrating tablet 2 mg  2 mg Oral QHS [name], MD      . thiamine tablet 100 mg  100 mg Oral Daily [name], MD   100 mg at 11/01/20 9150  . traZODone (DESYREL) tablet 50 mg  50 mg Oral QHS PRN [name], [name], MD   50 mg at 10/31/20 2129    Lab Results:  Results for orders placed or performed during the hospital encounter of 10/19/20 (from the past 48 hour(s))  Folate, serum, performed at [facility] lab     Status: None   Collection Time: 11/01/20  6:57 AM  Result Value Ref Range   Folate 12.3 >5.9 ng/mL    Comment: Performed at [facility], [address] [place], [place] [address]    Blood Alcohol level:  Lab Results  Component Value Date   [facility] <10 10/18/2020    Metabolic Disorder Labs: Lab Results  Component Value Date   HGBA1C 5.6 10/20/2020   MPG 114.02 10/20/2020   No results found for: PROLACTIN Lab Results  Component Value Date   CHOL 184 10/20/2020   TRIG 143 10/20/2020   HDL 51 10/20/2020   CHOLHDL 3.6 10/20/2020   VLDL 29 10/20/2020   LDLCALC 104 (H) 10/20/2020    Physical Findings: AIMS: Facial and Oral Movements Muscles of Facial Expression: None, normal Lips and Perioral Area: None, normal Jaw: None, normal Tongue: None, normal,Extremity Movements Upper (arms, wrists, hands, fingers): None, normal Lower (legs, knees, ankles, toes): None, normal, Trunk Movements Neck, shoulders, hips: None, normal, Overall Severity Severity of abnormal movements (highest score from questions above): None,  normal Incapacitation due to abnormal movements: None, normal Patient's awareness of abnormal movements (rate only patient's report): No Awareness, Dental Status Current problems with teeth and/or dentures?: No Does patient usually wear dentures?: No  CIWA:  CIWA-Ar Total: 4 COWS:  COWS Total Score: 1  Musculoskeletal: Strength & Muscle Tone: within normal limits Gait & Station: normal Patient leans: N/A  Psychiatric Specialty Exam:  Presentation  General Appearance: Disheveled  Eye Contact:Minimal  Speech:Blocked  Speech Volume:Normal  Handedness:Right   Mood and Affect  Mood:Irritable  Affect:Congruent   Thought Process  Thought Processes:Goal Directed  Descriptions of Associations:Loose  Orientation:Partial  Thought Content:Delusions; Illogical; Paranoid Ideation  History of Schizophrenia/Schizoaffective disorder:No  Duration of Psychotic Symptoms:Less than six months  Hallucinations:Hallucinations: Auditory; Command  Ideas of Reference:Percusatory  Suicidal Thoughts:Suicidal Thoughts: Yes, Active  Homicidal Thoughts:Homicidal Thoughts: No   Sensorium  Memory:Immediate Fair; Recent Poor; Remote Poor  Judgment:Impaired  Insight:Shallow   Executive Functions  Concentration:Fair  Attention Span:Poor  Recall:Poor  Fund of Knowledge:Fair  Language:Fair   Psychomotor Activity  Psychomotor Activity:Psychomotor Activity: Decreased   Assets  Assets:Desire for Improvement; Physical Health   Sleep  Sleep: Fair 8.15   Physical Exam: Physical Exam  ROS  Blood pressure 105/68, pulse 85, temperature 98.7 F (37.1 C), temperature source Oral, resp. rate 18, height 5\' 4"  (1.626 m), weight 47.6 kg, SpO2 98 %. Body mass index is 18.02 kg/m.   Treatment Plan Summary: Daily contact with patient to assess and evaluate symptoms and progress in treatment and Medication management 45 year old female with MDD, recurrent, severe, with  psychotic features. She continues to have command auditory hallucinations and suicidal ideations. Continues to feel that she is under arrest for "something terrible" and feels she is not allowed to eat. Minimal improvement with Risperdal or Abilify. Discussed ECT treatment, but patient currently declines. Continue Celexa 20 mg daily for mood. Decrease Ativan 1 mg TID for catatonia. Decrease risperdal 2 mg in the morning, 2 mg in the evening for psychosis with plan to taper and discontinue. Start Zyprexa 5 mg QHS for mood, psychosis, and poor appetite. Continue Naltrexone 50 mg daily for alcohol use disorder, nicoderm 21 mg patch daily for tobacco use disorder.      55, MD 11/01/2020, 11:02 AM

## 2020-11-02 DIAGNOSIS — F333 Major depressive disorder, recurrent, severe with psychotic symptoms: Secondary | ICD-10-CM | POA: Diagnosis not present

## 2020-11-02 LAB — RPR: RPR Ser Ql: NONREACTIVE

## 2020-11-02 MED ORDER — OLANZAPINE 5 MG PO TBDP
10.0000 mg | ORAL_TABLET | Freq: Every day | ORAL | Status: DC
  Administered 2020-11-02: 10 mg via ORAL

## 2020-11-02 NOTE — [facility] Group Notes (Signed)
LCSW Group Therapy Note  11/02/2020 12:54 PM  Type of Therapy/Topic:  Group Therapy:  Feelings about Diagnosis  Participation Level:  Did Not Attend   Description of Group:   This group will allow patients to explore their thoughts and feelings about diagnoses they have received. Patients will be guided to explore their level of understanding and acceptance of these diagnoses. Facilitator will encourage patients to process their thoughts and feelings about the reactions of others to their diagnosis and will guide patients in identifying ways to discuss their diagnosis with significant others in their lives. This group will be process-oriented, with patients participating in exploration of their own experiences, giving and receiving support, and processing challenge from other group members.   Therapeutic Goals: 1. Patient will demonstrate understanding of diagnosis as evidenced by identifying two or more symptoms of the disorder 2. Patient will be able to express two feelings regarding the diagnosis 3. Patient will demonstrate their ability to communicate their needs through discussion and/or role play  Summary of Patient Progress: X  Therapeutic Modalities:   Cognitive Behavioral Therapy Brief Therapy Feelings Identification   Anthonymichael Munday, MSW, LCSW 11/02/2020 12:54 PM  

## 2020-11-02 NOTE — Progress Notes (Signed)
Recreation Therapy Notes  Date: 11/02/2020  Time: 10:00 am   Location: Craft room   Behavioral response: N/A   Intervention Topic: Coping Skills   Discussion/Intervention: Patient did not attend group.   Clinical Observations/Feedback:  Patient did not attend group.   Nili Honda LRT/CTRS        Aldo Sondgeroth 11/02/2020 11:59 AM

## 2020-11-02 NOTE — Consult Note (Signed)
[facility]   Reason for Consult: Consult note for ECT for this 45 year old woman with severe depression Referring Physician: Neale BurlyFreeman Patient Identification: Cristina PopeCarmen Finley MRN:  [id number] Principal Diagnosis: MDD (major depressive disorder), recurrent, severe, with psychosis (HCC) Diagnosis:  Principal Problem:   MDD (major depressive disorder), recurrent, severe, with psychosis (HCC) Active Problems:   Alcohol abuse   Nicotine abuse   Total Time spent with patient: 45 minutes  Subjective:   Cristina PopeCarmen Finley is a 45 y.o. female patient admitted with "I am not doing good".  HPI: Patient seen chart reviewed.  45 year old woman who has been in the hospital now for about 2 weeks.  Came in because of severe depression with worsening cognition some psychotic like symptoms poor oral intake and suicidal thoughts.  Since being in the hospital patient has been on antidepressant medication and been receiving therapy and daily assessment.  She continues to report severe symptoms of depression largely unchanged from the time of admission.  Not currently aggressive or violent not acting out on the ward but has not been felt to be safe enough to be considered for discharge.  Underweight but no other major active medical problems  Past Psychiatric History: Past history of depression and had been receiving treatment with serotonin reuptake inhibitor medicine primarily outside the hospital.  No previous ECT.  No history of mania.  Risk to Self: What has been your use of drugs/alcohol within the last 12 months?: She reported drinking at least a six pack per day. She denied any other use but stated that in the past she used meth. Risk to Others:   Prior Inpatient Therapy:   Prior Outpatient Therapy:    Past Medical History: History reviewed. No pertinent past medical history. History reviewed. No pertinent surgical history. Family History: History reviewed. No pertinent family  history. Family Psychiatric  History: See previous Social History:  Social History   Substance and Sexual Activity  Alcohol Use Yes  . Alcohol/week: 6.0 standard drinks  . Types: 6 Cans of beer per week   Comment: one 6 pack daily     Social History   Substance and Sexual Activity  Drug Use Yes  . Types: Marijuana   Comment: last used 2 days ago per pt    Social History   Socioeconomic History  . Marital status: Married    Spouse name: Not on file  . Number of children: Not on file  . Years of education: Not on file  . Highest education level: Not on file  Occupational History  . Not on file  Tobacco Use  . Smoking status: Current Every Day Smoker    Packs/day: 1.50    Types: Cigarettes  . Smokeless tobacco: Never Used  Vaping Use  . Vaping Use: Never used  Substance and Sexual Activity  . Alcohol use: Yes    Alcohol/week: 6.0 standard drinks    Types: 6 Cans of beer per week    Comment: one 6 pack daily  . Drug use: Yes    Types: Marijuana    Comment: last used 2 days ago per pt  . Sexual activity: Yes    Partners: Male    Birth control/protection: Surgical  Other Topics Concern  . Not on file  Social History Narrative  . Not on file   Social Determinants of Health   Financial Resource Strain: Not on file  Food Insecurity: Not on file  Transportation Needs: Not on file  Physical Activity: Not on  file  Stress: Not on file  Social Connections: Not on file   Additional Social History:    Allergies:   Allergies  Allergen Reactions  . Penicillins Hives and Nausea Only    Labs:  Results for orders placed or performed during the hospital encounter of 10/19/20 (from the past 48 hour(s))  Folate, serum, performed at [facility] lab     Status: None   Collection Time: 11/01/20  6:57 AM  Result Value Ref Range   Folate 12.3 >5.9 ng/mL    Comment: Performed at [facility], [address] [place], [place] [address]  RPR     Status: None    Collection Time: 11/02/20  6:42 AM  Result Value Ref Range   RPR Ser Ql NON REACTIVE NON REACTIVE    Comment: Performed at [facility], [address], [place], [place] [address]    Current Facility-Administered Medications  Medication Dose Route Frequency Provider Last Rate Last Admin  . acetaminophen (TYLENOL) tablet 650 mg  650 mg Oral Q6H PRN Darryl Willner T, MD      . alum & mag hydroxide-simeth (MAALOX/MYLANTA) 200-200-20 MG/5ML suspension 30 mL  30 mL Oral Q4H PRN Yannis Gumbs T, MD      . escitalopram (LEXAPRO) tablet 10 mg  10 mg Oral QHS [name], MD   10 mg at 11/01/20 2116  . feeding supplement (ENSURE ENLIVE / ENSURE PLUS) liquid 237 mL  237 mL Oral BID BM [name], MD   237 mL at 11/02/20 1411  . guaiFENesin (ROBITUSSIN) 100 MG/5ML solution 200 mg  200 mg Oral Q4H PRN [name], MD   200 mg at 10/23/20 0800  . hydrOXYzine (ATARAX/VISTARIL) tablet 25 mg  25 mg Oral TID PRN Jamespaul Secrist, [name], MD   25 mg at 10/20/20 1352  . LORazepam (ATIVAN) tablet 1 mg  1 mg Oral TID [name], MD   1 mg at 11/02/20 1741  . magnesium hydroxide (MILK OF MAGNESIA) suspension 30 mL  30 mL Oral Daily PRN Gara Kincade T, MD      . multivitamin with minerals tablet 1 tablet  1 tablet Oral Daily [name], MD   1 tablet at 11/02/20 0831  . naltrexone (DEPADE) tablet 50 mg  50 mg Oral Daily [name], MD   50 mg at 11/02/20 9528  . nicotine (NICODERM CQ - dosed in mg/24 hours) patch 21 mg  21 mg Transdermal Daily Humna Moorehouse, [name], MD   21 mg at 10/29/20 0818  . OLANZapine zydis (ZYPREXA) disintegrating tablet 10 mg  10 mg Oral QHS [name], MD      . pneumococcal 23 valent vaccine (PNEUMOVAX-23) injection 0.5 mL  0.5 mL Intramuscular Tomorrow-1000 [name], MD      . risperiDONE (RISPERDAL M-TABS) disintegrating tablet 2 mg  2 mg Oral Daily [name], MD   2 mg at 11/02/20 0831  . thiamine tablet 100 mg  100 mg Oral Daily [name], MD   100 mg at 11/02/20 0831  . traZODone (DESYREL) tablet 50 mg  50 mg Oral QHS PRN Nalu Troublefield, [name], MD   50 mg at 10/31/20 2129    Musculoskeletal: Strength & Muscle Tone: within normal limits Gait & Station: normal Patient leans: N/A            Psychiatric Specialty Exam:  Presentation  General Appearance: Disheveled  Eye Contact:Minimal  Speech:Blocked  Speech Volume:Normal  Handedness:Right   Mood and Affect  Mood:Irritable  Affect:Congruent   Thought Process  Thought Processes:Goal Directed  Descriptions of Associations:Loose  Orientation:Partial  Thought Content:Delusions; Illogical; Paranoid Ideation  History of Schizophrenia/Schizoaffective disorder:No  Duration of Psychotic Symptoms:Less than six months  Hallucinations:Hallucinations: Auditory; Command  Ideas of Reference:Percusatory  Suicidal Thoughts:Suicidal Thoughts: Yes, Active  Homicidal Thoughts:Homicidal Thoughts: No   Sensorium  Memory:Immediate Fair; Recent Poor; Remote Poor  Judgment:Impaired  Insight:Shallow   Executive Functions  Concentration:Fair  Attention Span:Poor  Recall:Poor  Fund of Knowledge:Fair  Language:Fair   Psychomotor Activity  Psychomotor Activity:Psychomotor Activity: Decreased   Assets  Assets:Desire for Improvement; Physical Health   Sleep  Sleep:Sleep: Fair Number of Hours of Sleep: 8.15   Physical Exam: Physical Exam Vitals and nursing note reviewed.  Constitutional:      Appearance: Normal appearance.  HENT:     Head: Normocephalic and atraumatic.     Mouth/Throat:     Pharynx: Oropharynx is clear.  Eyes:     Pupils: Pupils are equal, round, and reactive to light.  Cardiovascular:     Rate and Rhythm: Normal rate and regular rhythm.  Pulmonary:     Effort: Pulmonary effort is normal.     Breath sounds: Normal breath sounds.  Abdominal:     General: Abdomen is flat.     Palpations: Abdomen is soft.   Musculoskeletal:        General: Normal range of motion.  Skin:    General: Skin is warm and dry.  Neurological:     General: No focal deficit present.     Mental Status: She is alert. Mental status is at baseline.  Psychiatric:        Attention and Perception: Attention normal.        Mood and Affect: Mood is depressed. Affect is blunt.        Speech: Speech is delayed.        Behavior: Behavior is slowed.        Thought Content: Thought content includes suicidal ideation. Thought content does not include suicidal plan.        Cognition and Memory: Cognition is impaired. Memory is impaired.    Review of Systems  Constitutional: Negative.   HENT: Negative.   Eyes: Negative.   Respiratory: Negative.   Cardiovascular: Negative.   Gastrointestinal: Negative.   Musculoskeletal: Negative.   Skin: Negative.   Neurological: Negative.   Psychiatric/Behavioral: Positive for depression, memory loss and suicidal ideas. The patient is nervous/anxious and has insomnia.    Blood pressure 90/70, pulse 79, temperature 98.4 F (36.9 C), temperature source Oral, resp. rate 18, height 5\' 4"  (1.626 m), weight 47.6 kg, SpO2 98 %. Body mass index is 18.02 kg/m.  Treatment Plan Summary: Plan This is a 45 year old woman who has major depression severe with psychotic features.  Extremely impaired with poor self-care and active suicidal thoughts and psychotic symptoms.  Has not had significant response to appropriate treatment so far.  No major medical problems.  Patient is a good candidate for electroconvulsive therapy.  Spent time describing ECT both in general and the specifics of our plan here at the hospital.  Reviewed potential benefits including high rate of improvement in depressive symptoms as well as side effects including pain and short-term memory problems.  Gave patient opportunity to ask questions.  I advised her that I would recommend ECT in her situation but made it clear that we did not  want  to force a decision on anyone.  I offered her that if she wanted to we could try starting tomorrow but if she did not feel ready to make a decision the next treatment date would be Friday.  Patient did not feel like she wanted to make a decision yet and said only that she would think about it.  I will pass along this information to primary psychiatrist and we will try and review regularly so that if she does choose to pursue ECT we will be ready on Friday.  Disposition: See note above.  Recommend ECT.  Awaiting patient to make a decision.  Always available to answer questions or offer more information  Mordecai Rasmussen, MD 11/02/2020 6:15 PM

## 2020-11-02 NOTE — Progress Notes (Signed)
[facility] MD Progress Note  11/02/2020 11:31 AM Cristina Finley  MRN:  [id number]   CC "Looking for a number"  Subjective:  45 year old female with history of depression presenting for worsening mood and suicidal ideations. No acute events overnight, medication compliant,  ADLs impaired.  Yesterday and this evening patient noted to be in arrest stance believing that she was in trouble.  This morning patient is sitting on the ground in her room looking through her belongings.  She states she is looking for a phone number.  She will not tell me his number she is looking for.  I did offer to give her her husband's number or to try and find another phone number for her, but she declines.  She continues to have severe depression and suicidal thoughts.  She is also starting to believe she is not allowed to eat once again.  Encourage patient to eat the food on her tray, and informed her that she was allowed to eat.  She denies any medication side effects, but notes that daytime fatigue.  She does inquire about ECT again today, and appears more interested in treatment.  She is agreeable to having Dr. Toni Amend speak to her about this procedure.  She has no other questions or concerns.  Denies homicidal ideations or visual hallucinations.   Principal Problem: MDD (major depressive disorder), recurrent, severe, with psychosis (HCC) Diagnosis: Principal Problem:   MDD (major depressive disorder), recurrent, severe, with psychosis (HCC) Active Problems:   Alcohol abuse   Nicotine abuse  Total Time spent with patient: 30 minutes  Past Psychiatric History: See H&P  Past Medical History: History reviewed. No pertinent past medical history. History reviewed. No pertinent surgical history. Family History: History reviewed. No pertinent family history. Family Psychiatric  History: See H&P Social History:  Social History   Substance and Sexual Activity  Alcohol Use Yes  . Alcohol/week: 6.0 standard drinks  . Types:  6 Cans of beer per week   Comment: one 6 pack daily     Social History   Substance and Sexual Activity  Drug Use Yes  . Types: Marijuana   Comment: last used 2 days ago per pt    Social History   Socioeconomic History  . Marital status: Married    Spouse name: Not on file  . Number of children: Not on file  . Years of education: Not on file  . Highest education level: Not on file  Occupational History  . Not on file  Tobacco Use  . Smoking status: Current Every Day Smoker    Packs/day: 1.50    Types: Cigarettes  . Smokeless tobacco: Never Used  Vaping Use  . Vaping Use: Never used  Substance and Sexual Activity  . Alcohol use: Yes    Alcohol/week: 6.0 standard drinks    Types: 6 Cans of beer per week    Comment: one 6 pack daily  . Drug use: Yes    Types: Marijuana    Comment: last used 2 days ago per pt  . Sexual activity: Yes    Partners: Male    Birth control/protection: Surgical  Other Topics Concern  . Not on file  Social History Narrative  . Not on file   Social Determinants of Health   Financial Resource Strain: Not on file  Food Insecurity: Not on file  Transportation Needs: Not on file  Physical Activity: Not on file  Stress: Not on file  Social Connections: Not on file   Additional  Social History:                         Sleep: Fair  Appetite:  Poor  Current Medications: Current Facility-Administered Medications  Medication Dose Route Frequency Provider Last Rate Last Admin  . acetaminophen (TYLENOL) tablet 650 mg  650 mg Oral Q6H PRN [name], [name], MD      . alum & mag hydroxide-simeth (MAALOX/MYLANTA) 200-200-20 MG/5ML suspension 30 mL  30 mL Oral Q4H PRN [name], [name], MD      . escitalopram (LEXAPRO) tablet 10 mg  10 mg Oral QHS [name], MD   10 mg at 11/01/20 2116  . feeding supplement (ENSURE ENLIVE / ENSURE PLUS) liquid 237 mL  237 mL Oral BID BM [name], MD   237 mL at 11/02/20 1028  . guaiFENesin  (ROBITUSSIN) 100 MG/5ML solution 200 mg  200 mg Oral Q4H PRN [name], MD   200 mg at 10/23/20 0800  . hydrOXYzine (ATARAX/VISTARIL) tablet 25 mg  25 mg Oral TID PRN [name], [name], MD   25 mg at 10/20/20 1352  . LORazepam (ATIVAN) tablet 1 mg  1 mg Oral TID [name], MD   1 mg at 11/02/20 0831  . magnesium hydroxide (MILK OF MAGNESIA) suspension 30 mL  30 mL Oral Daily PRN [name], [name], MD      . multivitamin with minerals tablet 1 tablet  1 tablet Oral Daily [name], MD   1 tablet at 11/02/20 0831  . naltrexone (DEPADE) tablet 50 mg  50 mg Oral Daily [name], MD   50 mg at 11/02/20 7106  . nicotine (NICODERM CQ - dosed in mg/24 hours) patch 21 mg  21 mg Transdermal Daily [name], [name], MD   21 mg at 10/29/20 0818  . OLANZapine zydis (ZYPREXA) disintegrating tablet 10 mg  10 mg Oral QHS [name], MD      . pneumococcal 23 valent vaccine (PNEUMOVAX-23) injection 0.5 mL  0.5 mL Intramuscular Tomorrow-1000 [name], MD      . risperiDONE (RISPERDAL M-TABS) disintegrating tablet 2 mg  2 mg Oral Daily [name], MD   2 mg at 11/02/20 0831  . thiamine tablet 100 mg  100 mg Oral Daily [name], MD   100 mg at 11/02/20 0831  . traZODone (DESYREL) tablet 50 mg  50 mg Oral QHS PRN [name], [name], MD   50 mg at 10/31/20 2129    Lab Results:  Results for orders placed or performed during the hospital encounter of 10/19/20 (from the past 48 hour(s))  Folate, serum, performed at [facility] lab     Status: None   Collection Time: 11/01/20  6:57 AM  Result Value Ref Range   Folate 12.3 >5.9 ng/mL    Comment: Performed at [facility], [address] [place], [place] [address]    Blood Alcohol level:  Lab Results  Component Value Date   [facility] <10 10/18/2020    Metabolic Disorder Labs: Lab Results  Component Value Date   HGBA1C 5.6 10/20/2020   MPG 114.02 10/20/2020   No results found for: PROLACTIN Lab Results   Component Value Date   CHOL 184 10/20/2020   TRIG 143 10/20/2020   HDL 51 10/20/2020   CHOLHDL 3.6 10/20/2020   VLDL 29 10/20/2020   LDLCALC 104 (H) 10/20/2020    Physical Findings: AIMS:  Facial and Oral Movements Muscles of Facial Expression: None, normal Lips and Perioral Area: None, normal Jaw: None, normal Tongue: None, normal,Extremity Movements Upper (arms, wrists, hands, fingers): None, normal Lower (legs, knees, ankles, toes): None, normal, Trunk Movements Neck, shoulders, hips: None, normal, Overall Severity Severity of abnormal movements (highest score from questions above): None, normal Incapacitation due to abnormal movements: None, normal Patient's awareness of abnormal movements (rate only patient's report): No Awareness, Dental Status Current problems with teeth and/or dentures?: No Does patient usually wear dentures?: No  CIWA:  CIWA-Ar Total: 4 COWS:  COWS Total Score: 1  Musculoskeletal: Strength & Muscle Tone: within normal limits Gait & Station: normal Patient leans: N/A  Psychiatric Specialty Exam:  Presentation  General Appearance: Disheveled  Eye Contact:Minimal  Speech:Blocked  Speech Volume:Normal  Handedness:Right   Mood and Affect  Mood:Irritable  Affect:Congruent   Thought Process  Thought Processes:Goal Directed  Descriptions of Associations:Loose  Orientation:Partial  Thought Content:Delusions; Illogical; Paranoid Ideation  History of Schizophrenia/Schizoaffective disorder:No  Duration of Psychotic Symptoms:Less than six months  Hallucinations:Hallucinations: Auditory; Command  Ideas of Reference:Percusatory  Suicidal Thoughts:Suicidal Thoughts: Yes, Active  Homicidal Thoughts:Homicidal Thoughts: No   Sensorium  Memory:Immediate Fair; Recent Poor; Remote Poor  Judgment:Impaired  Insight:Shallow   Executive Functions  Concentration:Fair  Attention Span:Poor  Recall:Poor  Fund of  Knowledge:Fair  Language:Fair   Psychomotor Activity  Psychomotor Activity:Psychomotor Activity: Decreased   Assets  Assets:Desire for Improvement; Physical Health   Sleep  Sleep: Fair, 8   Physical Exam: Physical Exam  ROS  Blood pressure 90/70, pulse 79, temperature 98.4 F (36.9 C), temperature source Oral, resp. rate 18, height 5\' 4"  (1.626 m), weight 47.6 kg, SpO2 98 %. Body mass index is 18.02 kg/m.   Treatment Plan Summary: Daily contact with patient to assess and evaluate symptoms and progress in treatment and Medication management 45 year old female with MDD, recurrent, severe, with psychotic features. She continues to have command auditory hallucinations and suicidal ideations. Continues to feel that she is under arrest for "something terrible" and feels she is not allowed to eat. Minimal improvement with Risperdal or Abilify. Discussed ECT treatment again today, and she is willing to discuss procedure with Dr. 57 today. Change Celexa 20 mg daily to Lexparo 10 mg QHS for mood per patient and husband request. Continue Ativan 1 mg TID for catatonia. Decrease risperdal 2 mg in the morning with plan to taper and discontinue. Increase Zyprexa 10 mg QHS for mood, psychosis, and poor appetite. Continue Naltrexone 50 mg daily for alcohol use disorder, nicoderm 21 mg patch daily for tobacco use disorder.      Toni Amend, MD 11/02/2020, 11:31 AM

## 2020-11-02 NOTE — Plan of Care (Signed)
Has remained isolative in room, pensive, not engaged in group activities. Disheveled and refusing to perform hygiene. Denies SHiHI/AVH but expresses helplessness and hopelessness. Encouragements and support provided. Safety precautions reinforced.

## 2020-11-03 DIAGNOSIS — F333 Major depressive disorder, recurrent, severe with psychotic symptoms: Secondary | ICD-10-CM | POA: Diagnosis not present

## 2020-11-03 MED ORDER — OLANZAPINE 5 MG PO TBDP
15.0000 mg | ORAL_TABLET | Freq: Every day | ORAL | Status: DC
  Administered 2020-11-03 – 2020-11-04 (×2): 15 mg via ORAL
  Filled 2020-11-03 (×2): qty 3

## 2020-11-03 NOTE — Progress Notes (Signed)
[facility] MD Progress Note  11/03/2020 10:39 AM Cristina Finley  MRN:  [id number]   CC "Just sleepy"  Subjective:  45 year old female with history of depression presenting for worsening mood and suicidal ideations. No acute events overnight, medication compliant,  ADLs impaired.    Patient seen one-on-one again today. She is lying in bed, but does turn over to speak to me. Eye contact remains minimal. Speech volume decreased, thought blocking still apparent. She notes that she continues to feel extremely depressed and like life is not worth living. She continues to endorse auditory hallucinations, but feels they are somewhat quieter today. States "I don't know" when asked if she still feels she is not allowed to eat, or that she is under arrest." She was able to speak to Dr. Toni Amend yesterday about ECT. When asked if she had given some thought to this she states she still needs to think. Patient reassured that this was elective, and would not be forced upon her. Encouraged her to let me or Dr. Toni Amend know if she had any further questions, wanted further information, or if she decides to undergo ECT. She denies any homicidal ideations or visual hallucinations.    Principal Problem: MDD (major depressive disorder), recurrent, severe, with psychosis (HCC) Diagnosis: Principal Problem:   MDD (major depressive disorder), recurrent, severe, with psychosis (HCC) Active Problems:   Alcohol abuse   Nicotine abuse  Total Time spent with patient: 20 minutes  Past Psychiatric History: See H&P  Past Medical History: History reviewed. No pertinent past medical history. History reviewed. No pertinent surgical history. Family History: History reviewed. No pertinent family history. Family Psychiatric  History: See H&P Social History:  Social History   Substance and Sexual Activity  Alcohol Use Yes  . Alcohol/week: 6.0 standard drinks  . Types: 6 Cans of beer per week   Comment: one 6 pack daily     Social  History   Substance and Sexual Activity  Drug Use Yes  . Types: Marijuana   Comment: last used 2 days ago per pt    Social History   Socioeconomic History  . Marital status: Married    Spouse name: Not on file  . Number of children: Not on file  . Years of education: Not on file  . Highest education level: Not on file  Occupational History  . Not on file  Tobacco Use  . Smoking status: Current Every Day Smoker    Packs/day: 1.50    Types: Cigarettes  . Smokeless tobacco: Never Used  Vaping Use  . Vaping Use: Never used  Substance and Sexual Activity  . Alcohol use: Yes    Alcohol/week: 6.0 standard drinks    Types: 6 Cans of beer per week    Comment: one 6 pack daily  . Drug use: Yes    Types: Marijuana    Comment: last used 2 days ago per pt  . Sexual activity: Yes    Partners: Male    Birth control/protection: Surgical  Other Topics Concern  . Not on file  Social History Narrative  . Not on file   Social Determinants of Health   Financial Resource Strain: Not on file  Food Insecurity: Not on file  Transportation Needs: Not on file  Physical Activity: Not on file  Stress: Not on file  Social Connections: Not on file   Additional Social History:       Sleep: Fair  Appetite:  Poor  Current Medications: Current Facility-Administered Medications  Medication  Dose Route Frequency Provider Last Rate Last Admin  . acetaminophen (TYLENOL) tablet 650 mg  650 mg Oral Q6H PRN [name], [name], MD      . alum & mag hydroxide-simeth (MAALOX/MYLANTA) 200-200-20 MG/5ML suspension 30 mL  30 mL Oral Q4H PRN [name], [name], MD      . escitalopram (LEXAPRO) tablet 10 mg  10 mg Oral QHS [name], MD   10 mg at 11/02/20 2138  . feeding supplement (ENSURE ENLIVE / ENSURE PLUS) liquid 237 mL  237 mL Oral BID BM [name], MD   237 mL at 11/03/20 0941  . guaiFENesin (ROBITUSSIN) 100 MG/5ML solution 200 mg  200 mg Oral Q4H PRN [name], MD   200 mg at  10/23/20 0800  . hydrOXYzine (ATARAX/VISTARIL) tablet 25 mg  25 mg Oral TID PRN [name], [name], MD   25 mg at 10/20/20 1352  . LORazepam (ATIVAN) tablet 1 mg  1 mg Oral TID [name], MD   1 mg at 11/03/20 8657  . magnesium hydroxide (MILK OF MAGNESIA) suspension 30 mL  30 mL Oral Daily PRN [name], [name], MD      . multivitamin with minerals tablet 1 tablet  1 tablet Oral Daily [name], MD   1 tablet at 11/03/20 0834  . naltrexone (DEPADE) tablet 50 mg  50 mg Oral Daily [name], MD   50 mg at 11/03/20 0836  . nicotine (NICODERM CQ - dosed in mg/24 hours) patch 21 mg  21 mg Transdermal Daily [name], [name], MD   21 mg at 11/03/20 0834  . OLANZapine zydis (ZYPREXA) disintegrating tablet 15 mg  15 mg Oral QHS [name], MD      . pneumococcal 23 valent vaccine (PNEUMOVAX-23) injection 0.5 mL  0.5 mL Intramuscular Tomorrow-1000 [name], MD      . thiamine tablet 100 mg  100 mg Oral Daily [name], MD   100 mg at 11/03/20 0834  . traZODone (DESYREL) tablet 50 mg  50 mg Oral QHS PRN [name], [name], MD   50 mg at 10/31/20 2129    Lab Results:  Results for orders placed or performed during the hospital encounter of 10/19/20 (from the past 48 hour(s))  RPR     Status: None   Collection Time: 11/02/20  6:42 AM  Result Value Ref Range   RPR Ser Ql NON REACTIVE NON REACTIVE    Comment: Performed at [facility], [address] [place], [place] [address]    Blood Alcohol level:  Lab Results  Component Value Date   ETH <10 10/18/2020    Metabolic Disorder Labs: Lab Results  Component Value Date   HGBA1C 5.6 10/20/2020   MPG 114.02 10/20/2020   No results found for: PROLACTIN Lab Results  Component Value Date   CHOL 184 10/20/2020   TRIG 143 10/20/2020   HDL 51 10/20/2020   CHOLHDL 3.6 10/20/2020   VLDL 29 10/20/2020   LDLCALC 104 (H) 10/20/2020    Physical Findings: AIMS: Facial and Oral Movements Muscles of Facial  Expression: None, normal Lips and Perioral Area: None, normal Jaw: None, normal Tongue: None, normal,Extremity Movements Upper (arms, wrists, hands, fingers): None, normal Lower (legs, knees, ankles, toes): None, normal, Trunk Movements Neck, shoulders, hips: None, normal, Overall Severity Severity of abnormal movements (highest score from questions above): None, normal Incapacitation due to abnormal movements: None, normal Patient's awareness of  abnormal movements (rate only patient's report): No Awareness, Dental Status Current problems with teeth and/or dentures?: No Does patient usually wear dentures?: No  CIWA:  CIWA-Ar Total: 4 COWS:  COWS Total Score: 1  Musculoskeletal: Strength & Muscle Tone: within normal limits Gait & Station: normal Patient leans: N/A  Psychiatric Specialty Exam:  Presentation  General Appearance: Disheveled  Eye Contact:Minimal  Speech:Blocked  Speech Volume:Normal  Handedness:Right   Mood and Affect  Mood:Irritable  Affect:Congruent   Thought Process  Thought Processes:Goal Directed  Descriptions of Associations:Loose  Orientation:Partial  Thought Content:Delusions; Illogical; Paranoid Ideation  History of Schizophrenia/Schizoaffective disorder:No  Duration of Psychotic Symptoms:Less than six months  Hallucinations:Auditory hallucinations Ideas of Reference:Percusatory  Suicidal Thoughts: Yes, without plan Homicidal Thoughts:Denies  Sensorium  Memory:Immediate Fair; Recent Poor; Remote Poor  Judgment:Impaired  Insight:Shallow   Executive Functions  Concentration:Fair  Attention Span:Poor  Recall:Poor  Fund of Knowledge:Fair  Language:Fair   Psychomotor Activity  Psychomotor Activity:No data recorded   Assets  Assets:Desire for Improvement; Physical Health   Sleep  Sleep: Good, 9   Physical Exam: Physical Exam  ROS  Blood pressure 94/70, pulse 78, temperature 98.5 F (36.9 C), temperature  source Oral, resp. rate 18, height 5\' 4"  (1.626 m), weight 47.6 kg, SpO2 98 %. Body mass index is 18.02 kg/m.   Treatment Plan Summary: Daily contact with patient to assess and evaluate symptoms and progress in treatment and Medication management 45 year old female with MDD, recurrent, severe, with psychotic features. She continues to have suicidal ideations and auditory hallucinations. Abilify and Risperdal were tried, and have been subsequently discontinued due to lack of efficacy. Today she reports auditory hallucinations are somewhat quieter than yesterday. Will increase Zyprexa 15 mg QHS. Continue Ativan 1 mg TID for catatonia. Continue Lexapro 10 mg QHS for mood. Continue Naltrexone 50 mg daily for alcohol use disorder, nicoderm 21 mg patch daily for tobacco use disorder. ECT has been discussed with patient for treatment of depression and psychosis. At this time, patient remains unsure if she would like to participate in this procedure.    57, MD 11/03/2020, 10:39 AM

## 2020-11-03 NOTE — Progress Notes (Signed)
Patient alert and oriented x 3 with periods of confusion to situation, shewas noted isolated to her room, minimal interaction with peers and staff, she currentlydenies SI/HI/AVH, she rated depression a 5/10 ( low 0  - 10 high )  her thoughts are disorganized, speech is tangential , Patient is not receptive to staff, she was complaint with medication regimen,15 minutes safety checks maintained,, will continue to monitor

## 2020-11-03 NOTE — Progress Notes (Signed)
D: Pt alert and oriented. Pt denies experiencing any anxiety/depression at this time Pt denies experiencing any pain at this time. Pt denies experiencing any SI/HI, or AVH at this time. Pt state "I'm not hearing any right now."  A: Scheduled medications administered to pt, per MD orders. Support and encouragement provided. Frequent verbal contact made. Routine safety checks conducted q15 minutes.   R: No adverse drug reactions noted. Pt verbally contracts for safety at this time. Pt complaint with medications and treatment plan. Pt interacts minimally with others on the unit and isolates at times. Pt remains safe at this time. Will continue to monitor.

## 2020-11-03 NOTE — Progress Notes (Signed)
Recreation Therapy Notes    Date: 11/03/2020  Time: 10:00 am   Location: Craft room   Behavioral response: N/A   Intervention Topic: Self-care   Discussion/Intervention: Patient did not attend group.   Clinical Observations/Feedback:  Patient did not attend group.   [name] LRT/CTRS         [name] 11/03/2020 11:41 AM

## 2020-11-03 NOTE — Plan of Care (Signed)
  Problem: Depression Goal: STG - Patient will identify 3 positive coping skills to decrease depressive symptoms within 5 recreation therapy group sessions Description: STG - Patient will identify 3 positive coping skills to decrease depressive symptoms within 5 recreation therapy group sessions Outcome: Not Progressing

## 2020-11-03 NOTE — [facility] Group Notes (Signed)
  LCSW Group Therapy Note     11/03/2020 2:16 PM     Type of Therapy/Topic:  Group Therapy:  Emotion Regulation     Participation Level:  Did Not Attend     Description of Group:   The purpose of this group is to assist patients in learning to regulate negative emotions and experience positive emotions. Patients will be guided to discuss ways in which they have been vulnerable to their negative emotions. These vulnerabilities will be juxtaposed with experiences of positive emotions or situations, and patients will be challenged to use positive emotions to combat negative ones. Special emphasis will be placed on coping with negative emotions in conflict situations, and patients will process healthy conflict resolution skills.     Therapeutic Goals:  1.    Patient will identify two positive emotions or experiences to reflect on in order to balance out negative emotions  2.    Patient will label two or more emotions that they find the most difficult to experience  3.    Patient will demonstrate positive conflict resolution skills through discussion and/or role plays     Summary of Patient Progress: X  Therapeutic Modalities:   Cognitive Behavioral Therapy  Feelings Identification  Dialectical Behavioral Therapy   Waniya Hoglund, MSW, LCSW-A  11/03/2020 2:16 PM  

## 2020-11-04 ENCOUNTER — Other Ambulatory Visit: Payer: Self-pay | Admitting: Psychiatry

## 2020-11-04 DIAGNOSIS — F333 Major depressive disorder, recurrent, severe with psychotic symptoms: Secondary | ICD-10-CM | POA: Diagnosis not present

## 2020-11-04 NOTE — Progress Notes (Signed)
D: Pt alert and oriented. Pt rates depression 9/10 and anxiety 7/10. Pt denies experiencing any pain at this time.Pt denies experiencing any SI/HI, or AVH at this time.   Pt can be observed out in the milieu and spend a little less time in her room. Pt however does appear sad and depressed. Pt was observed by this writer crying in her room this afternoon. When asked if she was okay, if there was anything this writer could do or get her the pt replied no, I'm fine, I'll be okay. Pt reassure this writer is here if she needs to talk or needs anything.   A: Scheduled medications administered to pt, per MD orders. Support and encouragement provided. Frequent verbal contact made. Routine safety checks conducted q15 minutes.   R: No adverse drug reactions noted. Pt verbally contracts for safety at this time. Pt complaint with medications and treatment plan. Pt interacts minimally with others on the unit and still isolates in her room at time, although it has improved somewhat. Pt remains safe at this time. Will continue to monitor.

## 2020-11-04 NOTE — Progress Notes (Signed)
Recreation Therapy Notes  Date: 11/04/2020  Time: 9:30 am   Location: Craft room     Behavioral response: N/A   Intervention Topic: Animal Assisted therapy    Discussion/Intervention: Patient did not attend group.   Clinical Observations/Feedback:  Patient did not attend group.   Tanica Gaige LRT/CTRS        Edla Para 11/04/2020 12:37 PM

## 2020-11-04 NOTE — [facility] Group Notes (Signed)
LCSW Group Therapy Note  11/04/2020 2:05 PM  Type of Therapy/Topic:  Group Therapy:  Balance in Life  Participation Level:  Did Not Attend  Description of Group:    This group will address the concept of balance and how it feels and looks when one is unbalanced. Patients will be encouraged to process areas in their lives that are out of balance and identify reasons for remaining unbalanced. Facilitators will guide patients in utilizing problem-solving interventions to address and correct the stressor making their life unbalanced. Understanding and applying boundaries will be explored and addressed for obtaining and maintaining a balanced life. Patients will be encouraged to explore ways to assertively make their unbalanced needs known to significant others in their lives, using other group members and facilitator for support and feedback.  Therapeutic Goals: 1. Patient will identify two or more emotions or situations they have that consume much of in their lives. 2. Patient will identify signs/triggers that life has become out of balance:  3. Patient will identify two ways to set boundaries in order to achieve balance in their lives:  4. Patient will demonstrate ability to communicate their needs through discussion and/or role plays  Summary of Patient Progress: X  Therapeutic Modalities:   Cognitive Behavioral Therapy Solution-Focused Therapy Assertiveness Training  Simona Huh R. Algis Greenhouse, MSW, LCSW, LCAS 11/04/2020 2:05 PM

## 2020-11-04 NOTE — Progress Notes (Signed)
[facility] MD Progress Note  11/04/2020 12:38 PM Cristina Finley  MRN:  [id number]   CC "Not great"  Subjective:  45 year old female with history of depression presenting for worsening mood and suicidal ideations. No acute events overnight, medication compliant,  ADLs impaired.    Patient seen one-on-one again today. She is sitting in bed in the dark, but she is observed coloring a picture. This is the most activity I have seen from her in a few days. She notes she is still feeling depressed and like life is not worth living. She continues to feel worried that she did something illegal and may get arrested, and continues to have thoughts that she is not allowed to eat. Discussed ECT again, and patient expresses interest in this today noting she is willing to try anything we feel may be helpful. Dr. Toni Amend notified that she was expressing more interest today. Also informed Dr. Toni Amend that her husband also seemed more interested in ECT treatment yesterday afternoon on the telephone.   Principal Problem: MDD (major depressive disorder), recurrent, severe, with psychosis (HCC) Diagnosis: Principal Problem:   MDD (major depressive disorder), recurrent, severe, with psychosis (HCC) Active Problems:   Alcohol abuse   Nicotine abuse  Total Time spent with patient: 20 minutes  Past Psychiatric History: See H&P  Past Medical History: History reviewed. No pertinent past medical history. History reviewed. No pertinent surgical history. Family History: History reviewed. No pertinent family history. Family Psychiatric  History: See H&P Social History:  Social History   Substance and Sexual Activity  Alcohol Use Yes  . Alcohol/week: 6.0 standard drinks  . Types: 6 Cans of beer per week   Comment: one 6 pack daily     Social History   Substance and Sexual Activity  Drug Use Yes  . Types: Marijuana   Comment: last used 2 days ago per pt    Social History   Socioeconomic History  . Marital status:  Married    Spouse name: Not on file  . Number of children: Not on file  . Years of education: Not on file  . Highest education level: Not on file  Occupational History  . Not on file  Tobacco Use  . Smoking status: Current Every Day Smoker    Packs/day: 1.50    Types: Cigarettes  . Smokeless tobacco: Never Used  Vaping Use  . Vaping Use: Never used  Substance and Sexual Activity  . Alcohol use: Yes    Alcohol/week: 6.0 standard drinks    Types: 6 Cans of beer per week    Comment: one 6 pack daily  . Drug use: Yes    Types: Marijuana    Comment: last used 2 days ago per pt  . Sexual activity: Yes    Partners: Male    Birth control/protection: Surgical  Other Topics Concern  . Not on file  Social History Narrative  . Not on file   Social Determinants of Health   Financial Resource Strain: Not on file  Food Insecurity: Not on file  Transportation Needs: Not on file  Physical Activity: Not on file  Stress: Not on file  Social Connections: Not on file   Additional Social History:       Sleep: Fair  Appetite:  Poor  Current Medications: Current Facility-Administered Medications  Medication Dose Route Frequency Provider Last Rate Last Admin  . acetaminophen (TYLENOL) tablet 650 mg  650 mg Oral Q6H PRN [name], [name], MD      .  alum & mag hydroxide-simeth (MAALOX/MYLANTA) 200-200-20 MG/5ML suspension 30 mL  30 mL Oral Q4H PRN [name], [name], MD      . escitalopram (LEXAPRO) tablet 10 mg  10 mg Oral QHS [name], MD   10 mg at 11/03/20 2134  . feeding supplement (ENSURE ENLIVE / ENSURE PLUS) liquid 237 mL  237 mL Oral BID BM [name], MD   237 mL at 11/04/20 1030  . guaiFENesin (ROBITUSSIN) 100 MG/5ML solution 200 mg  200 mg Oral Q4H PRN [name], MD   200 mg at 10/23/20 0800  . hydrOXYzine (ATARAX/VISTARIL) tablet 25 mg  25 mg Oral TID PRN [name], [name], MD   25 mg at 10/20/20 1352  . LORazepam (ATIVAN) tablet 1 mg  1 mg Oral TID [name], MD   1 mg at 11/04/20 1149  . magnesium hydroxide (MILK OF MAGNESIA) suspension 30 mL  30 mL Oral Daily PRN [name], [name], MD      . multivitamin with minerals tablet 1 tablet  1 tablet Oral Daily [name], MD   1 tablet at 11/04/20 0827  . naltrexone (DEPADE) tablet 50 mg  50 mg Oral Daily [name], MD   50 mg at 11/04/20 0830  . nicotine (NICODERM CQ - dosed in mg/24 hours) patch 21 mg  21 mg Transdermal Daily [name], [name], MD   21 mg at 11/04/20 4081  . OLANZapine zydis (ZYPREXA) disintegrating tablet 15 mg  15 mg Oral QHS [name], MD   15 mg at 11/03/20 2135  . pneumococcal 23 valent vaccine (PNEUMOVAX-23) injection 0.5 mL  0.5 mL Intramuscular Tomorrow-1000 [name], MD      . thiamine tablet 100 mg  100 mg Oral Daily [name], MD   100 mg at 11/04/20 0827  . traZODone (DESYREL) tablet 50 mg  50 mg Oral QHS PRN [name], [name], MD   50 mg at 10/31/20 2129    Lab Results:  No results found for this or any previous visit (from the past 48 hour(s)).  Blood Alcohol level:  Lab Results  Component Value Date   ETH <10 10/18/2020    Metabolic Disorder Labs: Lab Results  Component Value Date   HGBA1C 5.6 10/20/2020   MPG 114.02 10/20/2020   No results found for: PROLACTIN Lab Results  Component Value Date   CHOL 184 10/20/2020   TRIG 143 10/20/2020   HDL 51 10/20/2020   CHOLHDL 3.6 10/20/2020   VLDL 29 10/20/2020   LDLCALC 104 (H) 10/20/2020    Physical Findings: AIMS: Facial and Oral Movements Muscles of Facial Expression: None, normal Lips and Perioral Area: None, normal Jaw: None, normal Tongue: None, normal,Extremity Movements Upper (arms, wrists, hands, fingers): None, normal Lower (legs, knees, ankles, toes): None, normal, Trunk Movements Neck, shoulders, hips: None, normal, Overall Severity Severity of abnormal movements (highest score from questions above): None, normal Incapacitation due to abnormal movements:  None, normal Patient's awareness of abnormal movements (rate only patient's report): No Awareness, Dental Status Current problems with teeth and/or dentures?: No Does patient usually wear dentures?: No  CIWA:  CIWA-Ar Total: 4 COWS:  COWS Total Score: 1  Musculoskeletal: Strength & Muscle Tone: within normal limits Gait & Station: normal Patient leans: N/A  Psychiatric Specialty Exam:  Presentation  General Appearance: Disheveled  Eye Contact:Minimal  Speech:Blocked  Speech Volume:Normal  Handedness:Right   Mood and Affect  Mood:Irritable  Affect:Congruent  Thought Process  Thought Processes:Goal Directed  Descriptions of Associations:Loose  Orientation:Partial  Thought Content:Delusions; Illogical; Paranoid Ideation  History of Schizophrenia/Schizoaffective disorder:No  Duration of Psychotic Symptoms:Less than six months  Hallucinations:Auditory hallucinations Ideas of Reference:Percusatory  Suicidal Thoughts: Yes, without plan Homicidal Thoughts:Denies  Sensorium  Memory:Immediate Fair; Recent Poor; Remote Poor  Judgment:Impaired  Insight:Shallow   Executive Functions  Concentration:Fair  Attention Span:Poor  Recall:Poor  Fund of Knowledge:Fair  Language:Fair   Psychomotor Activity  Psychomotor Activity:Decreased  Assets  Assets:Desire for Improvement; Physical Health   Sleep  Sleep: Good, 8   Physical Exam: Physical Exam  ROS  Blood pressure 107/69, pulse 65, temperature 98.1 F (36.7 C), temperature source Oral, resp. rate 18, height 5\' 4"  (1.626 m), weight 47.6 kg, SpO2 100 %. Body mass index is 18.02 kg/m.   Treatment Plan Summary: Daily contact with patient to assess and evaluate symptoms and progress in treatment and Medication management 45 year old female with MDD, recurrent, severe, with psychotic features. She continues to have suicidal ideations and auditory hallucinations. Abilify and Risperdal were tried, and  have been subsequently discontinued due to lack of efficacy. Continues to have auditory hallucinations and paranoia that she is not allowed to eat, and that she has done something illegal. Continue Zyprexa 15 mg QHS (increased yesterday). Continue Ativan 1 mg TID for catatonia. Continue Lexapro 10 mg QHS for mood. Continue Naltrexone 50 mg daily for alcohol use disorder, nicoderm 21 mg patch daily for tobacco use disorder. Today, patient does express interest in ECT. Dr. 57 notified that patient seems interested in ECT. Also informed him that her husband also seemed interested in this treatment on the phone yesterday.    Toni Amend, MD 11/04/2020, 12:38 PM

## 2020-11-04 NOTE — Tx Team (Signed)
Interdisciplinary Treatment and Diagnostic Plan Update  11/04/2020 Time of Session: 8:30AM Cristina Finley MRN: [id number]  Principal Diagnosis: MDD (major depressive disorder), recurrent, severe, with psychosis (HCC)  Secondary Diagnoses: Principal Problem:   MDD (major depressive disorder), recurrent, severe, with psychosis (HCC) Active Problems:   Alcohol abuse   Nicotine abuse   Current Medications:  Current Facility-Administered Medications  Medication Dose Route Frequency Provider Last Rate Last Admin  . acetaminophen (TYLENOL) tablet 650 mg  650 mg Oral Q6H PRN [name], [name], MD      . alum & mag hydroxide-simeth (MAALOX/MYLANTA) 200-200-20 MG/5ML suspension 30 mL  30 mL Oral Q4H PRN [name], [name], MD      . escitalopram (LEXAPRO) tablet 10 mg  10 mg Oral QHS [name], MD   10 mg at 11/03/20 2134  . feeding supplement (ENSURE ENLIVE / ENSURE PLUS) liquid 237 mL  237 mL Oral BID BM [name], MD   237 mL at 11/03/20 1422  . guaiFENesin (ROBITUSSIN) 100 MG/5ML solution 200 mg  200 mg Oral Q4H PRN [name], MD   200 mg at 10/23/20 0800  . hydrOXYzine (ATARAX/VISTARIL) tablet 25 mg  25 mg Oral TID PRN [name], [name], MD   25 mg at 10/20/20 1352  . LORazepam (ATIVAN) tablet 1 mg  1 mg Oral TID [name], MD   1 mg at 11/04/20 0827  . magnesium hydroxide (MILK OF MAGNESIA) suspension 30 mL  30 mL Oral Daily PRN [name], [name], MD      . multivitamin with minerals tablet 1 tablet  1 tablet Oral Daily [name], MD   1 tablet at 11/04/20 0827  . naltrexone (DEPADE) tablet 50 mg  50 mg Oral Daily [name], MD   50 mg at 11/04/20 0830  . nicotine (NICODERM CQ - dosed in mg/24 hours) patch 21 mg  21 mg Transdermal Daily [name], [name], MD   21 mg at 11/04/20 7673  . OLANZapine zydis (ZYPREXA) disintegrating tablet 15 mg  15 mg Oral QHS [name], MD   15 mg at 11/03/20 2135  . pneumococcal 23 valent vaccine (PNEUMOVAX-23) injection  0.5 mL  0.5 mL Intramuscular Tomorrow-1000 [name], MD      . thiamine tablet 100 mg  100 mg Oral Daily [name], MD   100 mg at 11/04/20 0827  . traZODone (DESYREL) tablet 50 mg  50 mg Oral QHS PRN [name], [name], MD   50 mg at 10/31/20 2129   PTA Medications: Medications Prior to Admission  Medication Sig Dispense Refill Last Dose  . citalopram (CELEXA) 20 MG tablet Take 20 mg by mouth daily.       Patient Stressors: Marital or family conflict Substance abuse  Patient Strengths: Barrister's clerk for treatment/growth  Treatment Modalities: Medication Management, Group therapy, Case management,  1 to 1 session with clinician, Psychoeducation, Recreational therapy.   Physician Treatment Plan for Primary Diagnosis: MDD (major depressive disorder), recurrent, severe, with psychosis (HCC) Long Term Goal(s): Improvement in symptoms so as ready for discharge Improvement in symptoms so as ready for discharge   Short Term Goals: Ability to identify changes in lifestyle to reduce recurrence of condition will improve Ability to verbalize feelings will improve Ability to disclose and discuss suicidal ideas Ability to demonstrate self-control will improve Ability to identify and develop effective coping behaviors will improve Ability to maintain clinical measurements within normal limits will  improve Compliance with prescribed medications will improve Ability to identify triggers associated with substance abuse/mental health issues will improve Ability to identify changes in lifestyle to reduce recurrence of condition will improve Ability to verbalize feelings will improve Ability to disclose and discuss suicidal ideas Ability to demonstrate self-control will improve Ability to identify and develop effective coping behaviors will improve Ability to maintain clinical measurements within normal limits will improve Compliance with prescribed medications will  improve Ability to identify triggers associated with substance abuse/mental health issues will improve  Medication Management: Evaluate patient's response, side effects, and tolerance of medication regimen.  Therapeutic Interventions: 1 to 1 sessions, Unit Group sessions and Medication administration.  Evaluation of Outcomes: Not Progressing  Physician Treatment Plan for Secondary Diagnosis: Principal Problem:   MDD (major depressive disorder), recurrent, severe, with psychosis (HCC) Active Problems:   Alcohol abuse   Nicotine abuse  Long Term Goal(s): Improvement in symptoms so as ready for discharge Improvement in symptoms so as ready for discharge   Short Term Goals: Ability to identify changes in lifestyle to reduce recurrence of condition will improve Ability to verbalize feelings will improve Ability to disclose and discuss suicidal ideas Ability to demonstrate self-control will improve Ability to identify and develop effective coping behaviors will improve Ability to maintain clinical measurements within normal limits will improve Compliance with prescribed medications will improve Ability to identify triggers associated with substance abuse/mental health issues will improve Ability to identify changes in lifestyle to reduce recurrence of condition will improve Ability to verbalize feelings will improve Ability to disclose and discuss suicidal ideas Ability to demonstrate self-control will improve Ability to identify and develop effective coping behaviors will improve Ability to maintain clinical measurements within normal limits will improve Compliance with prescribed medications will improve Ability to identify triggers associated with substance abuse/mental health issues will improve     Medication Management: Evaluate patient's response, side effects, and tolerance of medication regimen.  Therapeutic Interventions: 1 to 1 sessions, Unit Group sessions and Medication  administration.  Evaluation of Outcomes: Not Progressing   RN Treatment Plan for Primary Diagnosis: MDD (major depressive disorder), recurrent, severe, with psychosis (HCC) Long Term Goal(s): Knowledge of disease and therapeutic regimen to maintain health will improve  Short Term Goals: Ability to remain free from injury will improve, Ability to demonstrate self-control, Ability to participate in decision making will improve, Ability to verbalize feelings will improve, Ability to disclose and discuss suicidal ideas, Ability to identify and develop effective coping behaviors will improve and Compliance with prescribed medications will improve  Medication Management: RN will administer medications as ordered by provider, will assess and evaluate patient's response and provide education to patient for prescribed medication. RN will report any adverse and/or side effects to prescribing provider.  Therapeutic Interventions: 1 on 1 counseling sessions, Psychoeducation, Medication administration, Evaluate responses to treatment, Monitor vital signs and CBGs as ordered, Perform/monitor CIWA, COWS, AIMS and Fall Risk screenings as ordered, Perform wound care treatments as ordered.  Evaluation of Outcomes: Not Progressing   LCSW Treatment Plan for Primary Diagnosis: MDD (major depressive disorder), recurrent, severe, with psychosis (HCC) Long Term Goal(s): Safe transition to appropriate next level of care at discharge, Engage patient in therapeutic group addressing interpersonal concerns.  Short Term Goals: Engage patient in aftercare planning with referrals and resources, Increase social support, Increase ability to appropriately verbalize feelings, Increase emotional regulation, Facilitate acceptance of mental health diagnosis and concerns, Facilitate patient progression through stages of change regarding substance use diagnoses  and concerns, Identify triggers associated with mental health/substance  abuse issues and Increase skills for wellness and recovery  Therapeutic Interventions: Assess for all discharge needs, 1 to 1 time with Social worker, Explore available resources and support systems, Assess for adequacy in community support network, Educate family and significant other(s) on suicide prevention, Complete Psychosocial Assessment, Interpersonal group therapy.  Evaluation of Outcomes: Not Progressing   Progress in Treatment: Attending groups: No. Participating in groups: No. Taking medication as prescribed: Yes. Toleration medication: Yes. Family/Significant other contact made: No, will contact:  completed with pt Patient understands diagnosis: Yes. Discussing patient identified problems/goals with staff: Yes. Medical problems stabilized or resolved: Yes. Denies suicidal/homicidal ideation: Yes. Issues/concerns per patient self-inventory: No. Other: None.  New problem(s) identified: No, Describe:  none.  New Short Term/Long Term Goal(s): detox, elimination of symptoms of psychosis, medication management for mood stabilization; elimination of SI thoughts; development of comprehensive mental wellness/sobriety plan. Update 10/25/20: No changes at this time. Update 10/30/20: Patient was declining and not ingesting some of her medications. Update 11/04/20: No changes at this time.  Patient Goals: "I just want to get these thoughts out of my head." Update 10/25/20: No changes at this time. Update 10/30/20: No changes at this time. Update 11/04/20: No changes at this time.   Discharge Plan or Barriers: CSW will assist pt with development of an appropriate aftercare//discharge plan. Pt to be given information regarding residential/inpt substance use treatment. Update 10/25/20: No changes at this time. Update 10/30/20: No changes at this time. Update 11/04/20: No changes at this time. Pt psychiatric symptoms do not appear to be getting better.  Reason for Continuation of Hospitalization:  Depression Hallucinations Medication stabilization Withdrawal symptoms  Estimated Length of Stay: TBD  Attendees: Patient:  11/04/2020 8:55 AM  Physician: Les Pou, MD 11/04/2020 8:55 AM  Nursing:  11/04/2020 8:55 AM  RN Care Manager: 11/04/2020 8:55 AM  Social Worker: Vilma Meckel. Algis Greenhouse, MSW, [place], LCAS 11/04/2020 8:55 AM  Recreational Therapist:  11/04/2020 8:55 AM  Other: Kiva [place], MSW, LCSW-A 11/04/2020 8:55 AM  Other: Penni Homans, MSW, LCSW 11/04/2020 8:55 AM  Other: 11/04/2020 8:55 AM    Scribe for Treatment Team: Glenis Smoker, LCSW 11/04/2020 8:55 AM

## 2020-11-04 NOTE — Progress Notes (Signed)
Patient alert and oriented x 3 with periods of confusion to situation, shewas visible in the milieu  Interacting with peers and staff, she currentlydenies SI/HI/AVH,she rated depression a 6/10 ( low 0 - 10 high ) her thoughts are organized, speech is soft non pressured, she is receptive to staff,she was complaint with medication regimen,15 minutes safety checks maintained, will continue to monitor.

## 2020-11-04 NOTE — Progress Notes (Signed)
Pt currently standing in the hallway with her arms extended in the arm. Pt will not say why and doesn't want to talk about. Pt states she's okay.

## 2020-11-04 NOTE — [facility] Group Notes (Signed)
[facility] Group Notes:  (Nursing/MHT/Case Management/Adjunct)  Date:  11/04/2020  Time:  1:00 AM  Type of Therapy:  Group Therapy  Participation Level:  Did Not Attend   Cristina Finley 11/04/2020, 1:00 AM

## 2020-11-05 ENCOUNTER — Encounter: Payer: Self-pay | Admitting: Anesthesiology

## 2020-11-05 DIAGNOSIS — F333 Major depressive disorder, recurrent, severe with psychotic symptoms: Secondary | ICD-10-CM | POA: Diagnosis not present

## 2020-11-05 LAB — GLUCOSE, CAPILLARY: Glucose-Capillary: 90 mg/dL (ref 70–99)

## 2020-11-05 LAB — METHYLMALONIC ACID, SERUM: Methylmalonic Acid, Quantitative: 319 nmol/L (ref 0–378)

## 2020-11-05 MED ORDER — OLANZAPINE 5 MG PO TBDP
20.0000 mg | ORAL_TABLET | Freq: Every day | ORAL | Status: DC
  Administered 2020-11-05 – 2020-11-14 (×10): 20 mg via ORAL
  Filled 2020-11-05 (×10): qty 4

## 2020-11-05 NOTE — Progress Notes (Signed)
Pt is alert and oriented to person, place, time and situation. Pt is calm, cooperative, denies suicidal and homicidal ideation. Pt reported she did not wish to have ECT this morning and drank 2 cups of coffee. Pt has been angry at times, irritable, depressed. Pt voiced suicidal ideation without a plan or intent and verbalized contract for safety. Pt isolates in her room. Pt refuses meds at times and requires encouragement later agreed to take some. Pt talks on the phone at times. Pt denies homicidal ideation, denies hallucinations. Reports feelings of depression and anxiety. Pt is tearful at times. Will continue to monitor pt per Q15 minute face checks and monitor for safety and progress.

## 2020-11-05 NOTE — Progress Notes (Signed)
Recreation Therapy Notes  Date: 11/05/2020  Time: 10:00 am   Location: Craft room  Behavioral response: N/A   Intervention Topic: Creative expression   Discussion/Intervention: Patient did not attend group.   Clinical Observations/Feedback:  Patient did not attend group.   Chanley Mcenery LRT/CTRS        Zarek Relph 11/05/2020 1:03 PM

## 2020-11-05 NOTE — [facility] Group Notes (Signed)
LCSW Group Therapy Note  11/05/2020 11:01 AM  Type of Therapy and Topic:  Group Therapy:  Feelings around Relapse and Recovery  Participation Level:  Did Not Attend   Description of Group:    Patients in this group will discuss emotions they experience before and after a relapse. They will process how experiencing these feelings, or avoidance of experiencing them, relates to having a relapse. Facilitator will guide patients to explore emotions they have related to recovery. Patients will be encouraged to process which emotions are more powerful. They will be guided to discuss the emotional reaction significant others in their lives may have to their relapse or recovery. Patients will be assisted in exploring ways to respond to the emotions of others without this contributing to a relapse.  Therapeutic Goals: 1. Patient will identify two or more emotions that lead to a relapse for them 2. Patient will identify two emotions that result when they relapse 3. Patient will identify two emotions related to recovery 4. Patient will demonstrate ability to communicate their needs through discussion and/or role plays   Summary of Patient Progress: X    Therapeutic Modalities:   Cognitive Behavioral Therapy Solution-Focused Therapy Assertiveness Training Relapse Prevention Therapy   Etienne Millward, MSW, LCSW 11/05/2020 11:01 AM  

## 2020-11-05 NOTE — Progress Notes (Signed)
[facility] MD Progress Note  11/05/2020 10:46 AM Cristina Finley  MRN:  [id number]   CC "Not great"  Subjective:  45 year old female with history of depression presenting for worsening mood and suicidal ideations. No acute events overnight, though she is again starting to stand in the hallway with her arms raised without speaking.  Medication compliant,  ADLs impaired.    Patient seen one-on-one again today.  She is sitting in bed with her head in her hands in the dark.  She is tearful, and reports feeling "agitated".  She continues to feel that life is not worth living, and that she is currently just and at spearmint.  She is started to have doubts about getting ECT treatments, and is scared that it will make her worse.  She has had 2 cups of coffee this morning, and will not be able to undergo procedure today.  Time spent going over the procedure, the rationale, and this percent success rate.  She continues to report auditory hallucinations, but denies homicidal ideations or visual hallucinations.  She is starting to have some odor, and it appears she has not showered in several days.  Principal Problem: MDD (major depressive disorder), recurrent, severe, with psychosis (HCC) Diagnosis: Principal Problem:   MDD (major depressive disorder), recurrent, severe, with psychosis (HCC) Active Problems:   Alcohol abuse   Nicotine abuse  Total Time spent with patient: 20 minutes  Past Psychiatric History: See H&P  Past Medical History: History reviewed. No pertinent past medical history. History reviewed. No pertinent surgical history. Family History: History reviewed. No pertinent family history. Family Psychiatric  History: See H&P Social History:  Social History   Substance and Sexual Activity  Alcohol Use Yes  . Alcohol/week: 6.0 standard drinks  . Types: 6 Cans of beer per week   Comment: one 6 pack daily     Social History   Substance and Sexual Activity  Drug Use Yes  . Types: Marijuana    Comment: last used 2 days ago per pt    Social History   Socioeconomic History  . Marital status: Married    Spouse name: Not on file  . Number of children: Not on file  . Years of education: Not on file  . Highest education level: Not on file  Occupational History  . Not on file  Tobacco Use  . Smoking status: Current Every Day Smoker    Packs/day: 1.50    Types: Cigarettes  . Smokeless tobacco: Never Used  Vaping Use  . Vaping Use: Never used  Substance and Sexual Activity  . Alcohol use: Yes    Alcohol/week: 6.0 standard drinks    Types: 6 Cans of beer per week    Comment: one 6 pack daily  . Drug use: Yes    Types: Marijuana    Comment: last used 2 days ago per pt  . Sexual activity: Yes    Partners: Male    Birth control/protection: Surgical  Other Topics Concern  . Not on file  Social History Narrative  . Not on file   Social Determinants of Health   Financial Resource Strain: Not on file  Food Insecurity: Not on file  Transportation Needs: Not on file  Physical Activity: Not on file  Stress: Not on file  Social Connections: Not on file   Additional Social History:       Sleep: Fair  Appetite:  Poor  Current Medications: Current Facility-Administered Medications  Medication Dose Route Frequency Provider Last Rate  Last Admin  . acetaminophen (TYLENOL) tablet 650 mg  650 mg Oral Q6H PRN [name], [name], MD      . alum & mag hydroxide-simeth (MAALOX/MYLANTA) 200-200-20 MG/5ML suspension 30 mL  30 mL Oral Q4H PRN [name], [name], MD      . escitalopram (LEXAPRO) tablet 10 mg  10 mg Oral QHS [name], MD   10 mg at 11/04/20 2131  . feeding supplement (ENSURE ENLIVE / ENSURE PLUS) liquid 237 mL  237 mL Oral BID BM [name], MD   237 mL at 11/05/20 1005  . guaiFENesin (ROBITUSSIN) 100 MG/5ML solution 200 mg  200 mg Oral Q4H PRN [name], MD   200 mg at 10/23/20 0800  . hydrOXYzine (ATARAX/VISTARIL) tablet 25 mg  25 mg Oral TID PRN  [name], [name], MD   25 mg at 10/20/20 1352  . LORazepam (ATIVAN) tablet 1 mg  1 mg Oral TID [name], MD   1 mg at 11/05/20 1000  . magnesium hydroxide (MILK OF MAGNESIA) suspension 30 mL  30 mL Oral Daily PRN [name], [name], MD      . multivitamin with minerals tablet 1 tablet  1 tablet Oral Daily [name], MD   1 tablet at 11/04/20 0827  . naltrexone (DEPADE) tablet 50 mg  50 mg Oral Daily [name], MD   50 mg at 11/04/20 0830  . nicotine (NICODERM CQ - dosed in mg/24 hours) patch 21 mg  21 mg Transdermal Daily [name], [name], MD   21 mg at 11/04/20 1540  . OLANZapine zydis (ZYPREXA) disintegrating tablet 20 mg  20 mg Oral QHS [name], MD      . pneumococcal 23 valent vaccine (PNEUMOVAX-23) injection 0.5 mL  0.5 mL Intramuscular Tomorrow-1000 [name], MD      . thiamine tablet 100 mg  100 mg Oral Daily [name], MD   100 mg at 11/04/20 0827  . traZODone (DESYREL) tablet 50 mg  50 mg Oral QHS PRN [name], [name], MD   50 mg at 11/04/20 2131    Lab Results:  Results for orders placed or performed during the hospital encounter of 10/19/20 (from the past 48 hour(s))  Glucose, capillary     Status: None   Collection Time: 11/05/20  6:40 AM  Result Value Ref Range   Glucose-Capillary 90 70 - 99 mg/dL    Comment: Glucose reference range applies only to samples taken after fasting for at least 8 hours.   Comment 1 Notify RN     Blood Alcohol level:  Lab Results  Component Value Date   ETH <10 10/18/2020    Metabolic Disorder Labs: Lab Results  Component Value Date   HGBA1C 5.6 10/20/2020   MPG 114.02 10/20/2020   No results found for: PROLACTIN Lab Results  Component Value Date   CHOL 184 10/20/2020   TRIG 143 10/20/2020   HDL 51 10/20/2020   CHOLHDL 3.6 10/20/2020   VLDL 29 10/20/2020   LDLCALC 104 (H) 10/20/2020    Physical Findings: AIMS: Facial and Oral Movements Muscles of Facial Expression: None, normal Lips and  Perioral Area: None, normal Jaw: None, normal Tongue: None, normal,Extremity Movements Upper (arms, wrists, hands, fingers): None, normal Lower (legs, knees, ankles, toes): None, normal, Trunk Movements Neck, shoulders, hips: None, normal, Overall Severity Severity of abnormal movements (highest score from questions above): None, normal Incapacitation due to abnormal movements: None, normal  Patient's awareness of abnormal movements (rate only patient's report): No Awareness, Dental Status Current problems with teeth and/or dentures?: No Does patient usually wear dentures?: No  CIWA:  CIWA-Ar Total: 4 COWS:  COWS Total Score: 1  Musculoskeletal: Strength & Muscle Tone: within normal limits Gait & Station: normal Patient leans: N/A  Psychiatric Specialty Exam:  Presentation  General Appearance: Disheveled  Eye Contact:Minimal  Speech:Blocked  Speech Volume:Normal  Handedness:Right   Mood and Affect  Mood:Irritable  Affect:Congruent   Thought Process  Thought Processes:Goal Directed  Descriptions of Associations:Loose  Orientation:Partial  Thought Content:Delusions; Illogical; Paranoid Ideation  History of Schizophrenia/Schizoaffective disorder:No  Duration of Psychotic Symptoms:Less than six months  Hallucinations:Auditory hallucinations Ideas of Reference:Percusatory  Suicidal Thoughts: Yes, without plan Homicidal Thoughts:Denies  Sensorium  Memory:Immediate Fair; Recent Poor; Remote Poor  Judgment:Impaired  Insight:Shallow   Executive Functions  Concentration:Fair  Attention Span:Poor  Recall:Poor  Fund of Knowledge:Fair  Language:Fair   Psychomotor Activity  Psychomotor Activity:Decreased  Assets  Assets:Desire for Improvement; Physical Health   Sleep  Sleep: Good, 8   Physical Exam: Physical Exam  ROS  Blood pressure 108/70, pulse 60, temperature 97.7 F (36.5 C), temperature source Oral, resp. rate 18, height 5\' 4"   (1.626 m), weight 47.6 kg, SpO2 98 %. Body mass index is 18.02 kg/m.   Treatment Plan Summary: Daily contact with patient to assess and evaluate symptoms and progress in treatment and Medication management 45 year old female with MDD, recurrent, severe, with psychotic features. She continues to have suicidal ideations and auditory hallucinations. Abilify and Risperdal were tried, and have been subsequently discontinued due to lack of efficacy. Continues to have auditory hallucinations and paranoia that she is not allowed to eat, and that she has done something illegal. Increase Zyprexa 20 mg QHS. Continue Ativan 1 mg TID for catatonia. Continue Lexapro 10 mg QHS for mood. Continue Naltrexone 50 mg daily for alcohol use disorder, nicoderm 21 mg patch daily for tobacco use disorder.  Yesterday patient expressed interest in ECT, however she is now having doubts about the procedure.  She did have 2 cups of coffee, and will be unable to undergo procedure today regardless.   55, MD 11/05/2020, 10:46 AM

## 2020-11-05 NOTE — Consult Note (Signed)
Follow-up ECT note: Patient had been placed on the schedule for ECT this morning.  She was aware that she had been requested to be n.p.o.  Patient consumed 2 cups of beverage midmorning today.  Expressed reservations about having ECT.  Because of oral consumption she was taken off of the schedule today.  Met with her this afternoon.  Patient continues to be very withdrawn.  Stays in her room in the dark most of the time.  Affect appears frightened and very withdrawn.  Does not appear to be eating well.  Spent some time talking about depression and expressing empathy for the difficulty she is having making decisions.  Reviewed with her that I think that ECT is the most likely treatment to help her with her current condition.  Reviewed potential side effects emphasizing that I believed that the potential benefit outweighed likely side effects at this point.  Unfortunately, because of the holiday we will not be doing ECT on Monday and the next scheduled treatments will be on Wednesday.  I will follow up with the patient next week.

## 2020-11-05 NOTE — Progress Notes (Signed)
Patient alert and oriented x3 with periods of confusion to situation,shewas visible in the milieu  Interacting with peers and staff, she currentlydenies SI/HI/AVH,she rated depression a510 ( low 0 - 10 high ) her thoughts areorganized, speech is soft non pressured, she is receptive to staff,she was complaint with medication regimen,15 minutes safety checks maintained, will continue to monitor.

## 2020-11-06 DIAGNOSIS — F333 Major depressive disorder, recurrent, severe with psychotic symptoms: Secondary | ICD-10-CM | POA: Diagnosis not present

## 2020-11-06 NOTE — Progress Notes (Signed)
Pt is alert and oriented to person, place, time but not to situation. Pt has a flat affect, reports depressed mood, reports anxiety, isolates in her room, comes out for meals and meds with encouragement. Pt denies suicidal and homicidal ideation, denies hallucinations, denies feelings of depression and anxiety. Will continue to monitor pt per Q15 minute face checks and monitor for safety and progress.

## 2020-11-06 NOTE — Plan of Care (Signed)
  Problem: Education: Goal: Ability to state activities that reduce stress will improve Outcome: Progressing   Problem: Coping: Goal: Ability to identify and develop effective coping behavior will improve Outcome: Progressing   Problem: Self-Concept: Goal: Ability to identify factors that promote anxiety will improve Outcome: Progressing Goal: Level of anxiety will decrease Outcome: Progressing Goal: Ability to modify response to factors that promote anxiety will improve Outcome: Progressing   Problem: Education: Goal: Utilization of techniques to improve thought processes will improve Outcome: Progressing Goal: Knowledge of the prescribed therapeutic regimen will improve Outcome: Progressing   Problem: Activity: Goal: Interest or engagement in leisure activities will improve Outcome: Progressing Goal: Imbalance in normal sleep/wake cycle will improve Outcome: Progressing   Problem: Coping: Goal: Coping ability will improve Outcome: Progressing Goal: Will verbalize feelings Outcome: Progressing   Problem: Health Behavior/Discharge Planning: Goal: Ability to make decisions will improve Outcome: Progressing Goal: Compliance with therapeutic regimen will improve Outcome: Progressing   Problem: Role Relationship: Goal: Will demonstrate positive changes in social behaviors and relationships Outcome: Progressing   Problem: Safety: Goal: Ability to disclose and discuss suicidal ideas will improve Outcome: Progressing Goal: Ability to identify and utilize support systems that promote safety will improve Outcome: Progressing   Problem: Self-Concept: Goal: Will verbalize positive feelings about self Outcome: Progressing Goal: Level of anxiety will decrease Outcome: Progressing   

## 2020-11-06 NOTE — Progress Notes (Signed)
[facility] MD Progress Note  11/06/2020 1:46 PM Cristina Finley  MRN:  [id number]  Principal Problem: MDD (major depressive disorder), recurrent, severe, with psychosis (HCC) Diagnosis: Principal Problem:   MDD (major depressive disorder), recurrent, severe, with psychosis (HCC) Active Problems:   Alcohol abuse   Nicotine abuse  Cristina Finley is a 45 y.o. female who presents to the [facility] unit for depression and suicidal ideations.   Interval History Patient was seen today for re-evaluation.  Nursing reports no events overnight. ADLs impaired.  Patient has been medication compliant.    Subjective:  Patient spends almost all the time in her room, in bed. She reports feeling depressed, she just lies, looks out the window and does not want to do anything. Reports that her life is worthless. She continues to report auditory hallucinations, denies visual hallucinations. Current suicidal/homicidal ideations: Denies The patient reports no side effects from medications.    Labs: no new results for review.      Total Time spent with patient: 20 minutes  Past Psychiatric History: see H&P  Past Medical History: History reviewed. No pertinent past medical history. History reviewed. No pertinent surgical history. Family History: History reviewed. No pertinent family history. Family Psychiatric  History: see H&P Social History:  Social History   Substance and Sexual Activity  Alcohol Use Yes  . Alcohol/week: 6.0 standard drinks  . Types: 6 Cans of beer per week   Comment: one 6 pack daily     Social History   Substance and Sexual Activity  Drug Use Yes  . Types: Marijuana   Comment: last used 2 days ago per pt    Social History   Socioeconomic History  . Marital status: Married    Spouse name: Not on file  . Number of children: Not on file  . Years of education: Not on file  . Highest education level: Not on file  Occupational History  . Not on file  Tobacco Use  . Smoking status:  Current Every Day Smoker    Packs/day: 1.50    Types: Cigarettes  . Smokeless tobacco: Never Used  Vaping Use  . Vaping Use: Never used  Substance and Sexual Activity  . Alcohol use: Yes    Alcohol/week: 6.0 standard drinks    Types: 6 Cans of beer per week    Comment: one 6 pack daily  . Drug use: Yes    Types: Marijuana    Comment: last used 2 days ago per pt  . Sexual activity: Yes    Partners: Male    Birth control/protection: Surgical  Other Topics Concern  . Not on file  Social History Narrative  . Not on file   Social Determinants of Health   Financial Resource Strain: Not on file  Food Insecurity: Not on file  Transportation Needs: Not on file  Physical Activity: Not on file  Stress: Not on file  Social Connections: Not on file   Additional Social History:                         Sleep: Fair  Appetite:  Poor  Current Medications: Current Facility-Administered Medications  Medication Dose Route Frequency Provider Last Rate Last Admin  . acetaminophen (TYLENOL) tablet 650 mg  650 mg Oral Q6H PRN [name], [name], MD      . alum & mag hydroxide-simeth (MAALOX/MYLANTA) 200-200-20 MG/5ML suspension 30 mL  30 mL Oral Q4H PRN [name], [name], MD      .  escitalopram (LEXAPRO) tablet 10 mg  10 mg Oral QHS [name], MD   10 mg at 11/05/20 2114  . feeding supplement (ENSURE ENLIVE / ENSURE PLUS) liquid 237 mL  237 mL Oral BID BM [name], MD   237 mL at 11/05/20 1005  . guaiFENesin (ROBITUSSIN) 100 MG/5ML solution 200 mg  200 mg Oral Q4H PRN [name], MD   200 mg at 10/23/20 0800  . hydrOXYzine (ATARAX/VISTARIL) tablet 25 mg  25 mg Oral TID PRN [name], [name], MD   25 mg at 10/20/20 1352  . LORazepam (ATIVAN) tablet 1 mg  1 mg Oral TID [name], MD   1 mg at 11/06/20 9379  . magnesium hydroxide (MILK OF MAGNESIA) suspension 30 mL  30 mL Oral Daily PRN [name], [name], MD      . multivitamin with minerals tablet 1 tablet  1 tablet  Oral Daily [name], MD   1 tablet at 11/06/20 0829  . naltrexone (DEPADE) tablet 50 mg  50 mg Oral Daily [name], MD   50 mg at 11/06/20 0240  . nicotine (NICODERM CQ - dosed in mg/24 hours) patch 21 mg  21 mg Transdermal Daily [name], [name], MD   21 mg at 11/04/20 9735  . OLANZapine zydis (ZYPREXA) disintegrating tablet 20 mg  20 mg Oral QHS [name], MD   20 mg at 11/05/20 2114  . pneumococcal 23 valent vaccine (PNEUMOVAX-23) injection 0.5 mL  0.5 mL Intramuscular Tomorrow-1000 [name], MD      . thiamine tablet 100 mg  100 mg Oral Daily [name], MD   100 mg at 11/06/20 3299  . traZODone (DESYREL) tablet 50 mg  50 mg Oral QHS PRN [name], [name], MD   50 mg at 11/04/20 2131    Lab Results:  Results for orders placed or performed during the hospital encounter of 10/19/20 (from the past 48 hour(s))  Glucose, capillary     Status: None   Collection Time: 11/05/20  6:40 AM  Result Value Ref Range   Glucose-Capillary 90 70 - 99 mg/dL    Comment: Glucose reference range applies only to samples taken after fasting for at least 8 hours.   Comment 1 Notify RN     Blood Alcohol level:  Lab Results  Component Value Date   ETH <10 10/18/2020    Metabolic Disorder Labs: Lab Results  Component Value Date   HGBA1C 5.6 10/20/2020   MPG 114.02 10/20/2020   No results found for: PROLACTIN Lab Results  Component Value Date   CHOL 184 10/20/2020   TRIG 143 10/20/2020   HDL 51 10/20/2020   CHOLHDL 3.6 10/20/2020   VLDL 29 10/20/2020   LDLCALC 104 (H) 10/20/2020    Physical Findings: AIMS: Facial and Oral Movements Muscles of Facial Expression: None, normal Lips and Perioral Area: None, normal Jaw: None, normal Tongue: None, normal,Extremity Movements Upper (arms, wrists, hands, fingers): None, normal Lower (legs, knees, ankles, toes): None, normal, Trunk Movements Neck, shoulders, hips: None, normal, Overall Severity Severity of abnormal  movements (highest score from questions above): None, normal Incapacitation due to abnormal movements: None, normal Patient's awareness of abnormal movements (rate only patient's report): No Awareness, Dental Status Current problems with teeth and/or dentures?: No Does patient usually wear dentures?: No  CIWA:  CIWA-Ar Total: 4 COWS:  COWS Total Score: 1  Musculoskeletal: Strength & Muscle Tone: within normal limits Gait &  Station: normal Patient leans: N/A  Psychiatric Specialty Exam:  Presentation  General Appearance: Disheveled  Eye Contact:Minimal  Speech:Blocked  Speech Volume:Normal  Handedness:Right   Mood and Affect  Mood:Irritable  Affect:Congruent   Thought Process  Thought Processes:Goal Directed  Descriptions of Associations:Loose  Orientation:Partial  Thought Content:Delusions; Illogical; Paranoid Ideation  History of Schizophrenia/Schizoaffective disorder:No  Duration of Psychotic Symptoms:Less than six months  Hallucinations:No data recorded Ideas of Reference:Percusatory  Suicidal Thoughts:No data recorded Homicidal Thoughts:No data recorded  Sensorium  Memory:Immediate Fair; Recent Poor; Remote Poor  Judgment:Impaired  Insight:Shallow   Executive Functions  Concentration:Fair  Attention Span:Poor  Recall:Poor  Fund of Knowledge:Fair  Language:Fair   Psychomotor Activity  Psychomotor Activity:No data recorded  Assets  Assets:Desire for Improvement; Physical Health   Sleep  Sleep:No data recorded   Physical Exam: Physical Exam ROS Blood pressure 99/70, pulse 71, temperature 98.5 F (36.9 C), temperature source Oral, resp. rate 18, height 5\' 4"  (1.626 m), weight 47.6 kg, SpO2 99 %. Body mass index is 18.02 kg/m.   Treatment Plan Summary: Daily contact with patient to assess and evaluate symptoms and progress in treatment and Medication management  Patient is a 45 year old female/female with the above-stated past  psychiatric history who is seen in follow-up.  Chart reviewed. Patient discussed with nursing. She continues to have suicidal ideations and auditory hallucinations, and paranoia. Zyprexa increased recently, no changes today. Continue Ativan 1 mg TID for catatonia. Continue Lexapro 10 mg QHS for mood. Continue Naltrexone 50 mg daily for alcohol use disorder, nicoderm 21 mg patch daily for tobacco use disorder.  Patient still expresses interest in ECT, she understands that due to the holiday, the eraliest it can happen on Wednesday.   Plan:  -continue inpatient psych admission; 15-minute checks; daily contact with patient to assess and evaluate symptoms and progress in treatment; psychoeducation.  -continue scheduled medications: . escitalopram  10 mg Oral QHS  . feeding supplement  237 mL Oral BID BM  . LORazepam  1 mg Oral TID  . multivitamin with minerals  1 tablet Oral Daily  . naltrexone  50 mg Oral Daily  . nicotine  21 mg Transdermal Daily  . OLANZapine zydis  20 mg Oral QHS  . pneumococcal 23 valent vaccine  0.5 mL Intramuscular Tomorrow-1000  . thiamine  100 mg Oral Daily    -continue PRN medications.  acetaminophen, alum & mag hydroxide-simeth, guaiFENesin, hydrOXYzine, magnesium hydroxide, traZODone  -Disposition: Patient is not stable for discharge yet.  -  I certify that the patient does need, on a daily basis, active treatment furnished directly by or requiring the supervision of inpatient psychiatric facility personnel.    Thursday, MD 11/06/2020, 1:46 PM

## 2020-11-06 NOTE — Progress Notes (Signed)
Patient has been flat and sad. Isolates to room. Denies SI and HI. Says she has heard voices but denies hearing them at present. Compliant with medication

## 2020-11-06 NOTE — [facility] Group Notes (Signed)
LCSW Group Therapy Note  11/06/2020 10:10 AM  Type of Therapy and Topic:  Group Therapy: Avoiding Self-Sabotaging and Enabling Behaviors  Participation Level:  Did Not Attend   Description of Group:   In this group, patients will learn how to identify obstacles, self-sabotaging and enabling behaviors, as well as: what are they, why do we do them and what needs these behaviors meet. Discuss unhealthy relationships and how to have positive healthy boundaries with those that sabotage and enable. Explore aspects of self-sabotage and enabling in yourself and how to limit these self-destructive behaviors in everyday life.   Therapeutic Goals: 1. Patient will identify one obstacle that relates to self-sabotage and enabling behaviors 2. Patient will identify one personal self-sabotaging or enabling behavior they did prior to admission 3. Patient will state a plan to change the above identified behavior 4. Patient will demonstrate ability to communicate their needs through discussion and/or role play.   Summary of Patient Progress: X  Therapeutic Modalities:   Cognitive Behavioral Therapy Person-Centered Therapy Motivational Interviewing  Muaaz Brau, MSW, LCSW 11/06/2020 10:10 AM  

## 2020-11-07 DIAGNOSIS — F333 Major depressive disorder, recurrent, severe with psychotic symptoms: Secondary | ICD-10-CM | POA: Diagnosis not present

## 2020-11-07 NOTE — Progress Notes (Signed)
Patient has been isolative to room. Flat and depressed.Denies SI and contracts for safety. She is compliant with medications but asked what they are for. She said that she had taken Zyprexa before and felt that it helped but does not feel like it is helping now.

## 2020-11-07 NOTE — Progress Notes (Signed)
Pt has been alert and oriented to person, place, time and situation. Pt is calm, cooperative, isolates in her room often, is quiet, affect is flat, mood is depressed, reports feelings of anxiety, reports the scheduled Ativan works to help control her anxiety. Pt denies suicidal and homicidal ideation, denies hallucinations. Pt does not initiate interaction with staff or peers, answers assessment questions briefly with poor eye contact, and is often noted to be sitting in bed in the dark quietly. Will continue to monitor pt per Q15 minute face checks and monitor for safety and progress.

## 2020-11-07 NOTE — Progress Notes (Signed)
[facility] MD Progress Note  11/07/2020 10:33 AM Cristina Finley  MRN:  [id number]  Principal Problem: MDD (major depressive disorder), recurrent, severe, with psychosis (HCC) Diagnosis: Principal Problem:   MDD (major depressive disorder), recurrent, severe, with psychosis (HCC) Active Problems:   Alcohol abuse   Nicotine abuse  Cristina Finley is a 45 y.o. female who presents to the [facility] unit for depression and suicidal ideations.  Interval History Patient was seen today for re-evaluation.  Nursing reports no events overnight. ADLs impaired.  Patient has been medication compliant.    Subjective:  Patient continues spending all the time in her room. She continues reporting depressed mood, anhedonia, no motivation. She denies active thoughts or plans of hurting self, reports passive death wishes due to hopelessness and feeling her life is worthless. She continues to report auditory hallucinations 'like me having conversations with myself" and reports that content of them "all over the place", denies visual hallucinations. Denies homicidal ideas.  The patient reports no side effects from medications.   She is still agreeable to try ECT treatment when available.  Labs: no new results for review.  Total Time spent with patient: 20 minutes  Past Psychiatric History: see H&P  Past Medical History: History reviewed. No pertinent past medical history. History reviewed. No pertinent surgical history. Family History: History reviewed. No pertinent family history. Family Psychiatric  History: see H&P Social History:  Social History   Substance and Sexual Activity  Alcohol Use Yes  . Alcohol/week: 6.0 standard drinks  . Types: 6 Cans of beer per week   Comment: one 6 pack daily     Social History   Substance and Sexual Activity  Drug Use Yes  . Types: Marijuana   Comment: last used 2 days ago per pt    Social History   Socioeconomic History  . Marital status: Married    Spouse name: Not on  file  . Number of children: Not on file  . Years of education: Not on file  . Highest education level: Not on file  Occupational History  . Not on file  Tobacco Use  . Smoking status: Current Every Day Smoker    Packs/day: 1.50    Types: Cigarettes  . Smokeless tobacco: Never Used  Vaping Use  . Vaping Use: Never used  Substance and Sexual Activity  . Alcohol use: Yes    Alcohol/week: 6.0 standard drinks    Types: 6 Cans of beer per week    Comment: one 6 pack daily  . Drug use: Yes    Types: Marijuana    Comment: last used 2 days ago per pt  . Sexual activity: Yes    Partners: Male    Birth control/protection: Surgical  Other Topics Concern  . Not on file  Social History Narrative  . Not on file   Social Determinants of Health   Financial Resource Strain: Not on file  Food Insecurity: Not on file  Transportation Needs: Not on file  Physical Activity: Not on file  Stress: Not on file  Social Connections: Not on file   Additional Social History:                         Sleep: Fair  Appetite:  Poor  Current Medications: Current Facility-Administered Medications  Medication Dose Route Frequency Provider Last Rate Last Admin  . acetaminophen (TYLENOL) tablet 650 mg  650 mg Oral Q6H PRN [name], [name], MD      .  alum & mag hydroxide-simeth (MAALOX/MYLANTA) 200-200-20 MG/5ML suspension 30 mL  30 mL Oral Q4H PRN [name], [name], MD      . escitalopram (LEXAPRO) tablet 10 mg  10 mg Oral QHS [name], MD   10 mg at 11/06/20 2113  . feeding supplement (ENSURE ENLIVE / ENSURE PLUS) liquid 237 mL  237 mL Oral BID BM [name], MD   237 mL at 11/07/20 0931  . guaiFENesin (ROBITUSSIN) 100 MG/5ML solution 200 mg  200 mg Oral Q4H PRN [name], MD   200 mg at 10/23/20 0800  . hydrOXYzine (ATARAX/VISTARIL) tablet 25 mg  25 mg Oral TID PRN [name], [name], MD   25 mg at 10/20/20 1352  . LORazepam (ATIVAN) tablet 1 mg  1 mg Oral TID [name], MD   1 mg at 11/07/20 8341  . magnesium hydroxide (MILK OF MAGNESIA) suspension 30 mL  30 mL Oral Daily PRN [name], [name], MD      . multivitamin with minerals tablet 1 tablet  1 tablet Oral Daily [name], MD   1 tablet at 11/07/20 9622  . naltrexone (DEPADE) tablet 50 mg  50 mg Oral Daily [name], MD   50 mg at 11/07/20 2979  . nicotine (NICODERM CQ - dosed in mg/24 hours) patch 21 mg  21 mg Transdermal Daily [name], [name], MD   21 mg at 11/04/20 8921  . OLANZapine zydis (ZYPREXA) disintegrating tablet 20 mg  20 mg Oral QHS [name], MD   20 mg at 11/06/20 2113  . pneumococcal 23 valent vaccine (PNEUMOVAX-23) injection 0.5 mL  0.5 mL Intramuscular Tomorrow-1000 [name], MD      . thiamine tablet 100 mg  100 mg Oral Daily [name], MD   100 mg at 11/07/20 1941  . traZODone (DESYREL) tablet 50 mg  50 mg Oral QHS PRN [name], [name], MD   50 mg at 11/04/20 2131    Lab Results:  No results found for this or any previous visit (from the past 48 hour(s)).  Blood Alcohol level:  Lab Results  Component Value Date   ETH <10 10/18/2020    Metabolic Disorder Labs: Lab Results  Component Value Date   HGBA1C 5.6 10/20/2020   MPG 114.02 10/20/2020   No results found for: PROLACTIN Lab Results  Component Value Date   CHOL 184 10/20/2020   TRIG 143 10/20/2020   HDL 51 10/20/2020   CHOLHDL 3.6 10/20/2020   VLDL 29 10/20/2020   LDLCALC 104 (H) 10/20/2020    Physical Findings: AIMS: Facial and Oral Movements Muscles of Facial Expression: None, normal Lips and Perioral Area: None, normal Jaw: None, normal Tongue: None, normal,Extremity Movements Upper (arms, wrists, hands, fingers): None, normal Lower (legs, knees, ankles, toes): None, normal, Trunk Movements Neck, shoulders, hips: None, normal, Overall Severity Severity of abnormal movements (highest score from questions above): None, normal Incapacitation due to abnormal movements: None,  normal Patient's awareness of abnormal movements (rate only patient's report): No Awareness, Dental Status Current problems with teeth and/or dentures?: No Does patient usually wear dentures?: No  CIWA:  CIWA-Ar Total: 4 COWS:  COWS Total Score: 1  Musculoskeletal: Strength & Muscle Tone: within normal limits Gait & Station: normal Patient leans: N/A  Psychiatric Specialty Exam:  Presentation  General Appearance: Disheveled  Eye Contact:Minimal  Speech:Blocked  Speech Volume:Normal  Handedness:Right   Mood and Affect  Mood:Irritable  Affect:Congruent  Thought Process  Thought Processes:Goal Directed  Descriptions of Associations:Loose  Orientation:Partial  Thought Content:Delusions; Illogical; Paranoid Ideation  History of Schizophrenia/Schizoaffective disorder:No  Duration of Psychotic Symptoms:Less than six months  Hallucinations:No data recorded Ideas of Reference:Percusatory  Suicidal Thoughts:No data recorded Homicidal Thoughts:No data recorded  Sensorium  Memory:Immediate Fair; Recent Poor; Remote Poor  Judgment:Impaired  Insight:Shallow   Executive Functions  Concentration:Fair  Attention Span:Poor  Recall:Poor  Fund of Knowledge:Fair  Language:Fair   Psychomotor Activity  Psychomotor Activity:No data recorded  Assets  Assets:Desire for Improvement; Physical Health   Sleep  Sleep:No data recorded   Physical Exam: Physical Exam  ROS  Blood pressure 103/74, pulse 66, temperature 98.2 F (36.8 C), temperature source Oral, resp. rate 18, height 5\' 4"  (1.626 m), weight 47.6 kg, SpO2 100 %. Body mass index is 18.02 kg/m.   Treatment Plan Summary: Daily contact with patient to assess and evaluate symptoms and progress in treatment and Medication management  Patient is a 45 year old female/female with the above-stated past psychiatric history who is seen in follow-up.  Chart reviewed. Patient discussed with nursing. She  continues to have suicidal ideations and auditory hallucinations, and paranoia. Zyprexa increased recently, no changes today. Continue Ativan 1 mg TID for catatonia. Continue Lexapro 10 mg QHS for mood. Continue Naltrexone 50 mg daily for alcohol use disorder, nicoderm 21 mg patch daily for tobacco use disorder.  Patient still expresses interest in ECT, she understands that due to the holiday, the eraliest it can happen on Wednesday.   Plan:  -continue inpatient psych admission; 15-minute checks; daily contact with patient to assess and evaluate symptoms and progress in treatment; psychoeducation.  -continue scheduled medications: . escitalopram  10 mg Oral QHS  . feeding supplement  237 mL Oral BID BM  . LORazepam  1 mg Oral TID  . multivitamin with minerals  1 tablet Oral Daily  . naltrexone  50 mg Oral Daily  . nicotine  21 mg Transdermal Daily  . OLANZapine zydis  20 mg Oral QHS  . pneumococcal 23 valent vaccine  0.5 mL Intramuscular Tomorrow-1000  . thiamine  100 mg Oral Daily    -continue PRN medications.  acetaminophen, alum & mag hydroxide-simeth, guaiFENesin, hydrOXYzine, magnesium hydroxide, traZODone  -Disposition: Patient is not stable for discharge yet.  -  I certify that the patient does need, on a daily basis, active treatment furnished directly by or requiring the supervision of inpatient psychiatric facility personnel.    Wednesday, MD 11/07/2020, 10:33 AM

## 2020-11-07 NOTE — Plan of Care (Signed)
  Problem: Education: Goal: Ability to state activities that reduce stress will improve Outcome: Progressing   Problem: Coping: Goal: Ability to identify and develop effective coping behavior will improve Outcome: Progressing   Problem: Self-Concept: Goal: Ability to identify factors that promote anxiety will improve Outcome: Progressing Goal: Level of anxiety will decrease Outcome: Progressing Goal: Ability to modify response to factors that promote anxiety will improve Outcome: Progressing   Problem: Education: Goal: Utilization of techniques to improve thought processes will improve Outcome: Progressing Goal: Knowledge of the prescribed therapeutic regimen will improve Outcome: Progressing   Problem: Activity: Goal: Interest or engagement in leisure activities will improve Outcome: Progressing Goal: Imbalance in normal sleep/wake cycle will improve Outcome: Progressing   Problem: Coping: Goal: Coping ability will improve Outcome: Progressing Goal: Will verbalize feelings Outcome: Progressing   Problem: Health Behavior/Discharge Planning: Goal: Ability to make decisions will improve Outcome: Progressing Goal: Compliance with therapeutic regimen will improve Outcome: Progressing   Problem: Role Relationship: Goal: Will demonstrate positive changes in social behaviors and relationships Outcome: Progressing   Problem: Safety: Goal: Ability to disclose and discuss suicidal ideas will improve Outcome: Progressing Goal: Ability to identify and utilize support systems that promote safety will improve Outcome: Progressing   Problem: Self-Concept: Goal: Will verbalize positive feelings about self Outcome: Progressing Goal: Level of anxiety will decrease Outcome: Progressing   

## 2020-11-07 NOTE — [facility] Group Notes (Signed)
LCSW Group Therapy Note  11/07/2020 1:53 PM  Type of Therapy and Topic:  Group Therapy:  Feelings around Relapse and Recovery  Participation Level:  Did Not Attend   Description of Group:    Patients in this group will discuss emotions they experience before and after a relapse. They will process how experiencing these feelings, or avoidance of experiencing them, relates to having a relapse. Facilitator will guide patients to explore emotions they have related to recovery. Patients will be encouraged to process which emotions are more powerful. They will be guided to discuss the emotional reaction significant others in their lives may have to their relapse or recovery. Patients will be assisted in exploring ways to respond to the emotions of others without this contributing to a relapse.  Therapeutic Goals: 1. Patient will identify two or more emotions that lead to a relapse for them 2. Patient will identify two emotions that result when they relapse 3. Patient will identify two emotions related to recovery 4. Patient will demonstrate ability to communicate their needs through discussion and/or role plays   Summary of Patient Progress: Patient did not attend group despite encouraged participation.    Therapeutic Modalities:   Cognitive Behavioral Therapy Solution-Focused Therapy Assertiveness Training Relapse Prevention Therapy   Gwenevere Ghazi, MSW, [place], [place] 11/07/2020 1:53 PM

## 2020-11-08 DIAGNOSIS — F333 Major depressive disorder, recurrent, severe with psychotic symptoms: Secondary | ICD-10-CM | POA: Diagnosis not present

## 2020-11-08 LAB — VITAMIN B1: Vitamin B1 (Thiamine): 101 nmol/L (ref 66.5–200.0)

## 2020-11-08 MED ORDER — ESCITALOPRAM OXALATE 10 MG PO TABS
20.0000 mg | ORAL_TABLET | Freq: Every day | ORAL | Status: DC
  Administered 2020-11-08 – 2020-11-14 (×7): 20 mg via ORAL
  Filled 2020-11-08 (×7): qty 2

## 2020-11-08 NOTE — Progress Notes (Signed)
[facility] MD Progress Note  11/08/2020 11:39 AM Cristina Finley  MRN:  [id number]  Principal Problem: MDD (major depressive disorder), recurrent, severe, with psychosis (HCC) Diagnosis: Principal Problem:   MDD (major depressive disorder), recurrent, severe, with psychosis (HCC) Active Problems:   Alcohol abuse   Nicotine abuse  Cristina Finley is a 45 y.o. female who presents to the [facility] unit for depression and suicidal ideations.  Interval History Patient was seen today for re-evaluation.  Nursing reports no events overnight. ADLs impaired.  Patient has been medication compliant.    Subjective: Patient seen and again today.  She continues to isolate to her room, and keep all the lights off.  She does make better eye contact today, and is less irritable.  She notes some improvement in her anxiety with Ativan.  She continues to feel very depressed, hopeless, and have passive death wishes.  She continues to have auditory hallucinations, but continues to refuse to elaborate on what is being said.  She denies any homicidal ideations or visual hallucinations.  She does note that she is feeling like she would like to participate in ECT again.  Her biggest concern was about anesthesia, but most of her questions were answered over the weekend and today.  Dr. Toni Finley notified.  Labs: no new results for review.  Total Time spent with patient: 20 minutes  Past Psychiatric History: see H&P  Past Medical History: History reviewed. No pertinent past medical history. History reviewed. No pertinent surgical history. Family History: History reviewed. No pertinent family history. Family Psychiatric  History: see H&P Social History:  Social History   Substance and Sexual Activity  Alcohol Use Yes  . Alcohol/week: 6.0 standard drinks  . Types: 6 Cans of beer per week   Comment: one 6 pack daily     Social History   Substance and Sexual Activity  Drug Use Yes  . Types: Marijuana   Comment: last used 2 days  ago per pt    Social History   Socioeconomic History  . Marital status: Married    Spouse name: Not on file  . Number of children: Not on file  . Years of education: Not on file  . Highest education level: Not on file  Occupational History  . Not on file  Tobacco Use  . Smoking status: Current Every Day Smoker    Packs/day: 1.50    Types: Cigarettes  . Smokeless tobacco: Never Used  Vaping Use  . Vaping Use: Never used  Substance and Sexual Activity  . Alcohol use: Yes    Alcohol/week: 6.0 standard drinks    Types: 6 Cans of beer per week    Comment: one 6 pack daily  . Drug use: Yes    Types: Marijuana    Comment: last used 2 days ago per pt  . Sexual activity: Yes    Partners: Male    Birth control/protection: Surgical  Other Topics Concern  . Not on file  Social History Narrative  . Not on file   Social Determinants of Health   Financial Resource Strain: Not on file  Food Insecurity: Not on file  Transportation Needs: Not on file  Physical Activity: Not on file  Stress: Not on file  Social Connections: Not on file   Additional Social History:     Sleep: Fair  Appetite:  Poor  Current Medications: Current Facility-Administered Medications  Medication Dose Route Frequency Provider Last Rate Last Admin  . acetaminophen (TYLENOL) tablet 650 mg  650  mg Oral Q6H PRN [name], [name], MD      . alum & mag hydroxide-simeth (MAALOX/MYLANTA) 200-200-20 MG/5ML suspension 30 mL  30 mL Oral Q4H PRN [name], [name], MD      . escitalopram (LEXAPRO) tablet 10 mg  10 mg Oral QHS [name], MD   10 mg at 11/07/20 2112  . feeding supplement (ENSURE ENLIVE / ENSURE PLUS) liquid 237 mL  237 mL Oral BID BM [name], MD   237 mL at 11/08/20 0946  . guaiFENesin (ROBITUSSIN) 100 MG/5ML solution 200 mg  200 mg Oral Q4H PRN [name], MD   200 mg at 10/23/20 0800  . hydrOXYzine (ATARAX/VISTARIL) tablet 25 mg  25 mg Oral TID PRN [name], [name], MD   25 mg at  10/20/20 1352  . LORazepam (ATIVAN) tablet 1 mg  1 mg Oral TID [name], MD   1 mg at 11/08/20 0809  . magnesium hydroxide (MILK OF MAGNESIA) suspension 30 mL  30 mL Oral Daily PRN [name], [name], MD      . multivitamin with minerals tablet 1 tablet  1 tablet Oral Daily [name], MD   1 tablet at 11/08/20 0809  . naltrexone (DEPADE) tablet 50 mg  50 mg Oral Daily [name], MD   50 mg at 11/08/20 0810  . nicotine (NICODERM CQ - dosed in mg/24 hours) patch 21 mg  21 mg Transdermal Daily [name], [name], MD   21 mg at 11/08/20 0810  . OLANZapine zydis (ZYPREXA) disintegrating tablet 20 mg  20 mg Oral QHS [name], MD   20 mg at 11/07/20 2112  . pneumococcal 23 valent vaccine (PNEUMOVAX-23) injection 0.5 mL  0.5 mL Intramuscular Tomorrow-1000 [name], MD      . thiamine tablet 100 mg  100 mg Oral Daily [name], MD   100 mg at 11/08/20 0810  . traZODone (DESYREL) tablet 50 mg  50 mg Oral QHS PRN [name], [name], MD   50 mg at 11/04/20 2131    Lab Results:  No results found for this or any previous visit (from the past 48 hour(s)).  Blood Alcohol level:  Lab Results  Component Value Date   ETH <10 10/18/2020    Metabolic Disorder Labs: Lab Results  Component Value Date   HGBA1C 5.6 10/20/2020   MPG 114.02 10/20/2020   No results found for: PROLACTIN Lab Results  Component Value Date   CHOL 184 10/20/2020   TRIG 143 10/20/2020   HDL 51 10/20/2020   CHOLHDL 3.6 10/20/2020   VLDL 29 10/20/2020   LDLCALC 104 (H) 10/20/2020    Physical Findings: AIMS: Facial and Oral Movements Muscles of Facial Expression: None, normal Lips and Perioral Area: None, normal Jaw: None, normal Tongue: None, normal,Extremity Movements Upper (arms, wrists, hands, fingers): None, normal Lower (legs, knees, ankles, toes): None, normal, Trunk Movements Neck, shoulders, hips: None, normal, Overall Severity Severity of abnormal movements (highest score from  questions above): None, normal Incapacitation due to abnormal movements: None, normal Patient's awareness of abnormal movements (rate only patient's report): No Awareness, Dental Status Current problems with teeth and/or dentures?: No Does patient usually wear dentures?: No  CIWA:  CIWA-Ar Total: 4 COWS:  COWS Total Score: 1  Musculoskeletal: Strength & Muscle Tone: within normal limits Gait & Station: normal Patient leans: N/A  Psychiatric Specialty Exam:  Presentation  General Appearance: Disheveled  Eye Contact:Fair  Speech:Slow  Speech Volume:Decreased  Handedness:Right   Mood and Affect  Mood:Dysphoric  Affect:Congruent   Thought Process  Thought Processes:Goal Directed  Descriptions of Associations:Intact  Orientation:Full (Time, Place and Person)  Thought Content:Delusions; Illogical; Paranoid Ideation  History of Schizophrenia/Schizoaffective disorder:No  Duration of Psychotic Symptoms:Less than six months  Hallucinations:Hallucinations: Auditory; Command  Ideas of Reference:Paranoia; Percusatory  Suicidal Thoughts:Suicidal Thoughts: Yes, Passive  Homicidal Thoughts:Homicidal Thoughts: No   Sensorium  Memory:Immediate Fair; Recent Fair  Judgment:Impaired  Insight:Shallow   Executive Functions  Concentration:Fair  Attention Span:Poor  Recall:Poor  Fund of Knowledge:Fair  Language:Fair   Psychomotor Activity  Psychomotor Activity:Psychomotor Activity: Decreased   Assets  Assets:Desire for Improvement; Physical Health; Social Support; Housing; Intimacy   Sleep  Sleep:Sleep: Good Number of Hours of Sleep: 8    Physical Exam: Physical Exam  ROS  Blood pressure 115/70, pulse 66, temperature 97.9 F (36.6 C), temperature source Oral, resp. rate 17, height 5\' 4"  (1.626 m), weight 47.6 kg, SpO2 100 %. Body mass index is 18.02 kg/m.   Treatment Plan Summary: Daily contact with patient to assess and evaluate symptoms and  progress in treatment and Medication management  Patient is a 45 year old female/female with the above-stated past psychiatric history who is seen in follow-up.  Chart reviewed. Patient discussed with nursing. She continues to have passive suicidal ideations and auditory hallucinations, and paranoia. Continue Zyprexa 20 mg QHS,. Ativan 1 mg TID for catatonia. Increase Lexapro 20 mg QHS for mood. Continue Naltrexone 50 mg daily for alcohol use disorder, nicoderm 21 mg patch daily for tobacco use disorder.  Patient still expresses interest in ECT, she understands that due to the holiday, the earliest it can happen on Wednesday.   Plan:  -continue inpatient psych admission; 15-minute checks; daily contact with patient to assess and evaluate symptoms and progress in treatment; psychoeducation.  -continue scheduled medications: . escitalopram  10 mg Oral QHS  . feeding supplement  237 mL Oral BID BM  . LORazepam  1 mg Oral TID  . multivitamin with minerals  1 tablet Oral Daily  . naltrexone  50 mg Oral Daily  . nicotine  21 mg Transdermal Daily  . OLANZapine zydis  20 mg Oral QHS  . pneumococcal 23 valent vaccine  0.5 mL Intramuscular Tomorrow-1000  . thiamine  100 mg Oral Daily    -continue PRN medications.  acetaminophen, alum & mag hydroxide-simeth, guaiFENesin, hydrOXYzine, magnesium hydroxide, traZODone  -Disposition: Patient is not stable for discharge yet.  -  I certify that the patient does need, on a daily basis, active treatment furnished directly by or requiring the supervision of inpatient psychiatric facility personnel.    Sunday, MD 11/08/2020, 11:39 AM

## 2020-11-08 NOTE — Progress Notes (Signed)
Patient has continued to be flat and sad. Medication compliant. Denies SI, HI and AVH

## 2020-11-08 NOTE — Progress Notes (Signed)
Recreation Therapy Notes  Date: 11/08/2020  Time: 9:30 am   Location: Court yard   Behavioral response: N/A   Intervention Topic: Social skills    Discussion/Intervention: Patient did not attend group.   Clinical Observations/Feedback:  Patient did not attend group.   Haifa Hatton LRT/CTRS        Terricka Onofrio 11/08/2020 12:01 PM

## 2020-11-08 NOTE — Plan of Care (Signed)
  Problem: Education: Goal: Ability to state activities that reduce stress will improve Outcome: Progressing   Problem: Coping: Goal: Ability to identify and develop effective coping behavior will improve Outcome: Progressing   Problem: Self-Concept: Goal: Ability to identify factors that promote anxiety will improve Outcome: Progressing Goal: Level of anxiety will decrease Outcome: Progressing Goal: Ability to modify response to factors that promote anxiety will improve Outcome: Progressing   Problem: Education: Goal: Utilization of techniques to improve thought processes will improve Outcome: Progressing Goal: Knowledge of the prescribed therapeutic regimen will improve Outcome: Progressing   Problem: Activity: Goal: Interest or engagement in leisure activities will improve Outcome: Progressing Goal: Imbalance in normal sleep/wake cycle will improve Outcome: Progressing   Problem: Coping: Goal: Coping ability will improve Outcome: Progressing Goal: Will verbalize feelings Outcome: Progressing   Problem: Health Behavior/Discharge Planning: Goal: Ability to make decisions will improve Outcome: Progressing Goal: Compliance with therapeutic regimen will improve Outcome: Progressing   Problem: Role Relationship: Goal: Will demonstrate positive changes in social behaviors and relationships Outcome: Progressing   Problem: Safety: Goal: Ability to disclose and discuss suicidal ideas will improve Outcome: Progressing Goal: Ability to identify and utilize support systems that promote safety will improve Outcome: Progressing   Problem: Self-Concept: Goal: Will verbalize positive feelings about self Outcome: Progressing Goal: Level of anxiety will decrease Outcome: Progressing   

## 2020-11-08 NOTE — Progress Notes (Signed)
D: Pt alert and oriented. Pt rates depression 10/10, hopelessness 6/10, and anxiety 6/10. Pt goal: "get my memory back." Pt reports energy level as low. Pt reports sleep last night as being fair. Pt denies experiencing any pain at this time. Pt denies experiencing any SI/HI, or AVH at this time. Pt states not right now and reassures writer that she will let staff know if she begins to experience any SI/HI/AVH at any given time.  Pt shares that she would like to participated in ECT however voices concern about being sedated, MD make aware. Also pt was observed with arms extended in the air late afternoon and evening time standing in the hallway. When asked about it pt as always states I'm okay.  A: Scheduled medications administered to pt, per MD orders. Support and encouragement provided. Frequent verbal contact made. Routine safety checks conducted q15 minutes.   R: No adverse drug reactions noted. Pt verbally contracts for safety at this time. Pt complaint with medications and treatment plan. Pt interacts well with others on the unit. Pt remains safe at this time. Will continue to monitor.

## 2020-11-08 NOTE — Plan of Care (Signed)
  Problem: Education: Goal: Ability to state activities that reduce stress will improve Outcome: Progressing   Problem: Coping: Goal: Ability to identify and develop effective coping behavior will improve Outcome: Progressing   Problem: Self-Concept: Goal: Ability to identify factors that promote anxiety will improve Outcome: Not Progressing Goal: Level of anxiety will decrease Outcome: Not Progressing   Problem: Education: Goal: Knowledge of the prescribed therapeutic regimen will improve Outcome: Progressing   Problem: Coping: Goal: Coping ability will improve Outcome: Progressing

## 2020-11-09 ENCOUNTER — Other Ambulatory Visit: Payer: Self-pay | Admitting: Psychiatry

## 2020-11-09 DIAGNOSIS — F333 Major depressive disorder, recurrent, severe with psychotic symptoms: Secondary | ICD-10-CM | POA: Diagnosis not present

## 2020-11-09 MED ORDER — LORAZEPAM 1 MG PO TABS
1.0000 mg | ORAL_TABLET | Freq: Three times a day (TID) | ORAL | Status: AC
  Administered 2020-11-09 (×2): 1 mg via ORAL
  Filled 2020-11-09 (×2): qty 1

## 2020-11-09 NOTE — Progress Notes (Signed)
Patient presents flat and depressed. She will engage in conversation when approached but interaction is minimal.  She continues to isolate in room, but does come out for snack and medication.  She denies SI/HI/AVH although she often seen responding to internal stimuli. She does admit to anxiety and depression, but does not want to rate or elaborate.  She is med compliant and received her medication without issue.  She is monitored with Q15 minute safety checks and encouraged to come to staff with any concerns.    Cleo Butler-Nicholson, LPN

## 2020-11-09 NOTE — [facility] Group Notes (Signed)
[facility] Group Notes:  (Nursing/MHT/Case Management/Adjunct)  Date:  11/09/2020  Time:  8:56 PM  Type of Therapy:  Group Therapy  Participation Level:  Minimal  Participation Quality:  Resistant  Affect:  Appropriate  Cognitive:  Alert  Insight:  Good  Engagement in Group:  Engaged and when I ask about her goals she said she didn't have nothing to say to me.  Modes of Intervention:  Support  Summary of Progress/Problems:  Cristina Finley 11/09/2020, 8:56 PM

## 2020-11-09 NOTE — [facility] Group Notes (Signed)
LCSW Group Therapy Note     11/09/2020 2:08 PM     Type of Therapy and Topic:  Group Therapy:  Overcoming Obstacles     Participation Level:  Did Not Attend     Description of Group:     In this group patients will be encouraged to explore what they see as obstacles to their own wellness and recovery. They will be guided to discuss their thoughts, feelings, and behaviors related to these obstacles. The group will process together ways to cope with barriers, with attention given to specific choices patients can make. Each patient will be challenged to identify changes they are motivated to make in order to overcome their obstacles. This group will be process-oriented, with patients participating in exploration of their own experiences as well as giving and receiving support and challenge from other group members.     Therapeutic Goals:  1.    Patient will identify personal and current obstacles as they relate to admission.  2.    Patient will identify barriers that currently interfere with their wellness or overcoming obstacles.  3.    Patient will identify feelings, thought process and behaviors related to these barriers.  4.    Patient will identify two changes they are willing to make to overcome these obstacles:        Summary of Patient Progress:  X   Therapeutic Modalities:    Cognitive Behavioral Therapy  Solution Focused Therapy  Motivational Interviewing  Relapse Prevention Therapy     Gicela Schwarting [place], MSW, LCSW-A  11/09/2020 2:08 PM

## 2020-11-09 NOTE — Tx Team (Signed)
Interdisciplinary Treatment and Diagnostic Plan Update  11/09/2020 Time of Session: 8:30AM Cristina Finley MRN: [id number]  Principal Diagnosis: MDD (major depressive disorder), recurrent, severe, with psychosis (HCC)  Secondary Diagnoses: Principal Problem:   MDD (major depressive disorder), recurrent, severe, with psychosis (HCC) Active Problems:   Alcohol abuse   Nicotine abuse   Current Medications:  Current Facility-Administered Medications  Medication Dose Route Frequency Provider Last Rate Last Admin  . acetaminophen (TYLENOL) tablet 650 mg  650 mg Oral Q6H PRN [name], [name], MD      . alum & mag hydroxide-simeth (MAALOX/MYLANTA) 200-200-20 MG/5ML suspension 30 mL  30 mL Oral Q4H PRN [name], [name], MD      . escitalopram (LEXAPRO) tablet 20 mg  20 mg Oral QHS [name], MD   20 mg at 11/08/20 2122  . feeding supplement (ENSURE ENLIVE / ENSURE PLUS) liquid 237 mL  237 mL Oral BID BM [name], MD   237 mL at 11/08/20 1343  . guaiFENesin (ROBITUSSIN) 100 MG/5ML solution 200 mg  200 mg Oral Q4H PRN [name], MD   200 mg at 10/23/20 0800  . hydrOXYzine (ATARAX/VISTARIL) tablet 25 mg  25 mg Oral TID PRN [name], [name], MD   25 mg at 10/20/20 1352  . LORazepam (ATIVAN) tablet 1 mg  1 mg Oral TID [name], MD   1 mg at 11/09/20 7124  . magnesium hydroxide (MILK OF MAGNESIA) suspension 30 mL  30 mL Oral Daily PRN [name], [name], MD      . multivitamin with minerals tablet 1 tablet  1 tablet Oral Daily [name], MD   1 tablet at 11/09/20 5809  . naltrexone (DEPADE) tablet 50 mg  50 mg Oral Daily [name], MD   50 mg at 11/09/20 9833  . nicotine (NICODERM CQ - dosed in mg/24 hours) patch 21 mg  21 mg Transdermal Daily [name], [name], MD   21 mg at 11/09/20 0826  . OLANZapine zydis (ZYPREXA) disintegrating tablet 20 mg  20 mg Oral QHS [name], MD   20 mg at 11/08/20 2122  . pneumococcal 23 valent vaccine (PNEUMOVAX-23) injection  0.5 mL  0.5 mL Intramuscular Tomorrow-1000 [name], MD      . thiamine tablet 100 mg  100 mg Oral Daily [name], MD   100 mg at 11/09/20 8250  . traZODone (DESYREL) tablet 50 mg  50 mg Oral QHS PRN [name], [name], MD   50 mg at 11/04/20 2131   PTA Medications: Medications Prior to Admission  Medication Sig Dispense Refill Last Dose  . citalopram (CELEXA) 20 MG tablet Take 20 mg by mouth daily.       Patient Stressors: Marital or family conflict Substance abuse  Patient Strengths: Barrister's clerk for treatment/growth  Treatment Modalities: Medication Management, Group therapy, Case management,  1 to 1 session with clinician, Psychoeducation, Recreational therapy.   Physician Treatment Plan for Primary Diagnosis: MDD (major depressive disorder), recurrent, severe, with psychosis (HCC) Long Term Goal(s): Improvement in symptoms so as ready for discharge Improvement in symptoms so as ready for discharge   Short Term Goals: Ability to identify changes in lifestyle to reduce recurrence of condition will improve Ability to verbalize feelings will improve Ability to disclose and discuss suicidal ideas Ability to demonstrate self-control will improve Ability to identify and develop effective coping behaviors will improve Ability to maintain clinical measurements within normal limits will  improve Compliance with prescribed medications will improve Ability to identify triggers associated with substance abuse/mental health issues will improve Ability to identify changes in lifestyle to reduce recurrence of condition will improve Ability to verbalize feelings will improve Ability to disclose and discuss suicidal ideas Ability to demonstrate self-control will improve Ability to identify and develop effective coping behaviors will improve Ability to maintain clinical measurements within normal limits will improve Compliance with prescribed medications will  improve Ability to identify triggers associated with substance abuse/mental health issues will improve  Medication Management: Evaluate patient's response, side effects, and tolerance of medication regimen.  Therapeutic Interventions: 1 to 1 sessions, Unit Group sessions and Medication administration.  Evaluation of Outcomes: Not Progressing  Physician Treatment Plan for Secondary Diagnosis: Principal Problem:   MDD (major depressive disorder), recurrent, severe, with psychosis (HCC) Active Problems:   Alcohol abuse   Nicotine abuse  Long Term Goal(s): Improvement in symptoms so as ready for discharge Improvement in symptoms so as ready for discharge   Short Term Goals: Ability to identify changes in lifestyle to reduce recurrence of condition will improve Ability to verbalize feelings will improve Ability to disclose and discuss suicidal ideas Ability to demonstrate self-control will improve Ability to identify and develop effective coping behaviors will improve Ability to maintain clinical measurements within normal limits will improve Compliance with prescribed medications will improve Ability to identify triggers associated with substance abuse/mental health issues will improve Ability to identify changes in lifestyle to reduce recurrence of condition will improve Ability to verbalize feelings will improve Ability to disclose and discuss suicidal ideas Ability to demonstrate self-control will improve Ability to identify and develop effective coping behaviors will improve Ability to maintain clinical measurements within normal limits will improve Compliance with prescribed medications will improve Ability to identify triggers associated with substance abuse/mental health issues will improve     Medication Management: Evaluate patient's response, side effects, and tolerance of medication regimen.  Therapeutic Interventions: 1 to 1 sessions, Unit Group sessions and Medication  administration.  Evaluation of Outcomes: Not Progressing   RN Treatment Plan for Primary Diagnosis: MDD (major depressive disorder), recurrent, severe, with psychosis (HCC) Long Term Goal(s): Knowledge of disease and therapeutic regimen to maintain health will improve  Short Term Goals: Ability to remain free from injury will improve, Ability to demonstrate self-control, Ability to participate in decision making will improve, Ability to verbalize feelings will improve, Ability to disclose and discuss suicidal ideas, Ability to identify and develop effective coping behaviors will improve and Compliance with prescribed medications will improve  Medication Management: RN will administer medications as ordered by provider, will assess and evaluate patient's response and provide education to patient for prescribed medication. RN will report any adverse and/or side effects to prescribing provider.  Therapeutic Interventions: 1 on 1 counseling sessions, Psychoeducation, Medication administration, Evaluate responses to treatment, Monitor vital signs and CBGs as ordered, Perform/monitor CIWA, COWS, AIMS and Fall Risk screenings as ordered, Perform wound care treatments as ordered.  Evaluation of Outcomes: Not Progressing   LCSW Treatment Plan for Primary Diagnosis: MDD (major depressive disorder), recurrent, severe, with psychosis (HCC) Long Term Goal(s): Safe transition to appropriate next level of care at discharge, Engage patient in therapeutic group addressing interpersonal concerns.  Short Term Goals: Engage patient in aftercare planning with referrals and resources, Increase social support, Increase ability to appropriately verbalize feelings, Increase emotional regulation, Facilitate acceptance of mental health diagnosis and concerns, Facilitate patient progression through stages of change regarding substance use diagnoses  and concerns, Identify triggers associated with mental health/substance  abuse issues and Increase skills for wellness and recovery  Therapeutic Interventions: Assess for all discharge needs, 1 to 1 time with Social worker, Explore available resources and support systems, Assess for adequacy in community support network, Educate family and significant other(s) on suicide prevention, Complete Psychosocial Assessment, Interpersonal group therapy.  Evaluation of Outcomes: Not Progressing   Progress in Treatment: Attending groups: No. Participating in groups: No. Taking medication as prescribed: Yes. Toleration medication: Yes. Family/Significant other contact made: No, will contact:  completed with pt Patient understands diagnosis: Yes. Discussing patient identified problems/goals with staff: Yes. Medical problems stabilized or resolved: Yes. Denies suicidal/homicidal ideation: Yes. Issues/concerns per patient self-inventory: No. Other: None.  New problem(s) identified: No, Describe:  none.  New Short Term/Long Term Goal(s): detox, elimination of symptoms of psychosis, medication management for mood stabilization; elimination of SI thoughts; development of comprehensive mental wellness/sobriety plan. Update 10/25/20: No changes at this time. Update 10/30/20: Patient was declining and not ingesting some of her medications. Update 11/04/20: No changes at this time. Update 11/09/20: No changes at this time.  Patient Goals: "I just want to get these thoughts out of my head." Update 10/25/20: No changes at this time. Update 10/30/20: No changes at this time. Update 11/04/20: No changes at this time. Update 11/09/20: No changes at this time.    Discharge Plan or Barriers: CSW will assist pt with development of an appropriate aftercare//discharge plan. Pt to be given information regarding residential/inpt substance use treatment. Update 10/25/20: No changes at this time. Update 10/30/20: No changes at this time. Update 11/04/20: No changes at this time. Pt psychiatric symptoms do  not appear to be getting better. Update 11/09/20: No changes at this time.  Reason for Continuation of Hospitalization: Depression Hallucinations Medication stabilization Withdrawal symptoms  Estimated Length of Stay: TBD  Attendees: Patient:  11/09/2020 8:51 AM  Physician: Les Pou, MD 11/09/2020 8:51 AM  Nursing:  11/09/2020 8:51 AM  RN Care Manager: 11/09/2020 8:51 AM  Social Worker: Vilma Meckel. Algis Greenhouse, MSW, Sharon, LCAS 11/09/2020 8:51 AM  Recreational Therapist:  11/09/2020 8:51 AM  Other: Kiva [place], MSW, LCSW-A 11/09/2020 8:51 AM  Other: Penni Homans, MSW, LCSW 11/09/2020 8:51 AM  Other: 11/09/2020 8:51 AM    Scribe for Treatment Team: Glenis Smoker, LCSW 11/09/2020 8:51 AM

## 2020-11-09 NOTE — Progress Notes (Signed)
[facility] MD Progress Note  11/09/2020 10:54 AM Cristina Finley  MRN:  [id number]  Principal Problem: MDD (major depressive disorder), recurrent, severe, with psychosis (HCC) Diagnosis: Principal Problem:   MDD (major depressive disorder), recurrent, severe, with psychosis (HCC) Active Problems:   Alcohol abuse   Nicotine abuse  Cristina Finley is a 45 y.o. female who presents to the [facility] unit for depression and suicidal ideations.  Interval History Patient was seen today for re-evaluation.  Nursing reports no events overnight. ADLs impaired.  Patient has been medication compliant.    Subjective: Patient seen and again today.  Walk into the day room to retrieve a cup of coffee.  She has showered recently.  She notes she is still feeling very depressed, and having passive suicidal ideations.  She also continues to have auditory hallucinations and paranoia.  She denies any homicidal ideations or visual hallucinations.  She remains interested in undergoing ECT, and is aware that this will begin tomorrow.  Reviewed n.p.o. after midnight orders.  She has no questions or concerns today.  Labs: no new results for review.  Total Time spent with patient: 20 minutes  Past Psychiatric History: see H&P  Past Medical History: History reviewed. No pertinent past medical history. History reviewed. No pertinent surgical history. Family History: History reviewed. No pertinent family history. Family Psychiatric  History: see H&P Social History:  Social History   Substance and Sexual Activity  Alcohol Use Yes  . Alcohol/week: 6.0 standard drinks  . Types: 6 Cans of beer per week   Comment: one 6 pack daily     Social History   Substance and Sexual Activity  Drug Use Yes  . Types: Marijuana   Comment: last used 2 days ago per pt    Social History   Socioeconomic History  . Marital status: Married    Spouse name: Not on file  . Number of children: Not on file  . Years of education: Not on file  .  Highest education level: Not on file  Occupational History  . Not on file  Tobacco Use  . Smoking status: Current Every Day Smoker    Packs/day: 1.50    Types: Cigarettes  . Smokeless tobacco: Never Used  Vaping Use  . Vaping Use: Never used  Substance and Sexual Activity  . Alcohol use: Yes    Alcohol/week: 6.0 standard drinks    Types: 6 Cans of beer per week    Comment: one 6 pack daily  . Drug use: Yes    Types: Marijuana    Comment: last used 2 days ago per pt  . Sexual activity: Yes    Partners: Male    Birth control/protection: Surgical  Other Topics Concern  . Not on file  Social History Narrative  . Not on file   Social Determinants of Health   Financial Resource Strain: Not on file  Food Insecurity: Not on file  Transportation Needs: Not on file  Physical Activity: Not on file  Stress: Not on file  Social Connections: Not on file   Additional Social History:     Sleep: Fair  Appetite:  Poor  Current Medications: Current Facility-Administered Medications  Medication Dose Route Frequency Provider Last Rate Last Admin  . acetaminophen (TYLENOL) tablet 650 mg  650 mg Oral Q6H PRN [name], [name], MD      . alum & mag hydroxide-simeth (MAALOX/MYLANTA) 200-200-20 MG/5ML suspension 30 mL  30 mL Oral Q4H PRN [name], [name], MD      .  escitalopram (LEXAPRO) tablet 20 mg  20 mg Oral QHS [name], MD   20 mg at 11/08/20 2122  . feeding supplement (ENSURE ENLIVE / ENSURE PLUS) liquid 237 mL  237 mL Oral BID BM [name], MD   237 mL at 11/08/20 1343  . guaiFENesin (ROBITUSSIN) 100 MG/5ML solution 200 mg  200 mg Oral Q4H PRN [name], MD   200 mg at 10/23/20 0800  . hydrOXYzine (ATARAX/VISTARIL) tablet 25 mg  25 mg Oral TID PRN [name], [name], MD   25 mg at 10/20/20 1352  . LORazepam (ATIVAN) tablet 1 mg  1 mg Oral TID [name], MD      . magnesium hydroxide (MILK OF MAGNESIA) suspension 30 mL  30 mL Oral Daily PRN [name], [name], MD       . multivitamin with minerals tablet 1 tablet  1 tablet Oral Daily [name], MD   1 tablet at 11/09/20 8144  . naltrexone (DEPADE) tablet 50 mg  50 mg Oral Daily [name], MD   50 mg at 11/09/20 8185  . nicotine (NICODERM CQ - dosed in mg/24 hours) patch 21 mg  21 mg Transdermal Daily [name], [name], MD   21 mg at 11/09/20 0826  . OLANZapine zydis (ZYPREXA) disintegrating tablet 20 mg  20 mg Oral QHS [name], MD   20 mg at 11/08/20 2122  . pneumococcal 23 valent vaccine (PNEUMOVAX-23) injection 0.5 mL  0.5 mL Intramuscular Tomorrow-1000 [name], MD      . thiamine tablet 100 mg  100 mg Oral Daily [name], MD   100 mg at 11/09/20 6314  . traZODone (DESYREL) tablet 50 mg  50 mg Oral QHS PRN [name], [name], MD   50 mg at 11/04/20 2131    Lab Results:  No results found for this or any previous visit (from the past 48 hour(s)).  Blood Alcohol level:  Lab Results  Component Value Date   ETH <10 10/18/2020    Metabolic Disorder Labs: Lab Results  Component Value Date   HGBA1C 5.6 10/20/2020   MPG 114.02 10/20/2020   No results found for: PROLACTIN Lab Results  Component Value Date   CHOL 184 10/20/2020   TRIG 143 10/20/2020   HDL 51 10/20/2020   CHOLHDL 3.6 10/20/2020   VLDL 29 10/20/2020   LDLCALC 104 (H) 10/20/2020    Physical Findings: AIMS: Facial and Oral Movements Muscles of Facial Expression: None, normal Lips and Perioral Area: None, normal Jaw: None, normal Tongue: None, normal,Extremity Movements Upper (arms, wrists, hands, fingers): None, normal Lower (legs, knees, ankles, toes): None, normal, Trunk Movements Neck, shoulders, hips: None, normal, Overall Severity Severity of abnormal movements (highest score from questions above): None, normal Incapacitation due to abnormal movements: None, normal Patient's awareness of abnormal movements (rate only patient's report): No Awareness, Dental Status Current problems with  teeth and/or dentures?: No Does patient usually wear dentures?: No  CIWA:  CIWA-Ar Total: 4 COWS:  COWS Total Score: 1  Musculoskeletal: Strength & Muscle Tone: within normal limits Gait & Station: normal Patient leans: N/A  Psychiatric Specialty Exam:  Presentation  General Appearance: Disheveled  Eye Contact:Fair  Speech:Slow  Speech Volume:Decreased  Handedness:Right   Mood and Affect  Mood:Dysphoric  Affect:Congruent   Thought Process  Thought Processes:Goal Directed  Descriptions of Associations:Intact  Orientation:Full (Time, Place and Person)  Thought Content:Delusions; Illogical; Paranoid Ideation  History of Schizophrenia/Schizoaffective disorder:No  Duration of Psychotic Symptoms:Less than six months  Hallucinations:Hallucinations: Auditory; Command  Ideas of Reference:Paranoia; Percusatory  Suicidal Thoughts:Suicidal Thoughts: Yes, Passive  Homicidal Thoughts:Homicidal Thoughts: No   Sensorium  Memory:Immediate Fair; Recent Fair  Judgment:Impaired  Insight:Shallow   Executive Functions  Concentration:Fair  Attention Span:Poor  Recall:Poor  Fund of Knowledge:Fair  Language:Fair   Psychomotor Activity  Psychomotor Activity:Psychomotor Activity: Decreased   Assets  Assets:Desire for Improvement; Physical Health; Social Support; Housing; Intimacy   Sleep  Sleep: Good, 8.25   Physical Exam: Physical Exam  ROS  Blood pressure 91/60, pulse 62, temperature 98.6 F (37 C), temperature source Oral, resp. rate 18, height 5\' 4"  (1.626 m), weight 47.6 kg, SpO2 98 %. Body mass index is 18.02 kg/m.   Treatment Plan Summary: Daily contact with patient to assess and evaluate symptoms and progress in treatment and Medication management  Patient is a 45 year old female/female with the above-stated past psychiatric history who is seen in follow-up.  Chart reviewed. Patient discussed with nursing. She continues to have passive  suicidal ideations and auditory hallucinations, and paranoia. Continue Zyprexa 20 mg QHS,. Ativan 1 mg TID for catatonia. Continue Lexapro 20 mg QHS for mood. Continue Naltrexone 50 mg daily for alcohol use disorder, nicoderm 21 mg patch daily for tobacco use disorder.  Patient has consented to ECT which will begin tomorrow morning.    Plan:  -continue inpatient psych admission; 15-minute checks; daily contact with patient to assess and evaluate symptoms and progress in treatment; psychoeducation.  -continue scheduled medications: . escitalopram  20 mg Oral QHS  . feeding supplement  237 mL Oral BID BM  . LORazepam  1 mg Oral TID  . multivitamin with minerals  1 tablet Oral Daily  . naltrexone  50 mg Oral Daily  . nicotine  21 mg Transdermal Daily  . OLANZapine zydis  20 mg Oral QHS  . pneumococcal 23 valent vaccine  0.5 mL Intramuscular Tomorrow-1000  . thiamine  100 mg Oral Daily    -continue PRN medications.  acetaminophen, alum & mag hydroxide-simeth, guaiFENesin, hydrOXYzine, magnesium hydroxide, traZODone  -Disposition: Patient is not stable for discharge yet.  -  I certify that the patient does need, on a daily basis, active treatment furnished directly by or requiring the supervision of inpatient psychiatric facility personnel.   11/09/20: Psychiatric exam above reviewed and remains accurate. Assessment and plan above reviewed and updated.   11/11/20, MD 11/09/2020, 10:54 AM

## 2020-11-09 NOTE — Consult Note (Signed)
Follow-up for ECT: Treatment team informed me that the patient was now saying she would be agreeable to ECT.  Came down to meet with her.  Patient continues to be flat withdrawn low energy slow and to report depression.  She now however says that she feels that she is more comfortable consenting to ECT for tomorrow.  I offered to answer any questions she had and reassured her that our staff are used to dealing with anxious people and will try and make things as easy as possible.  Orders will be placed for n.p.o. after midnight and the patient is reminded not to eat or drink anything in the morning.

## 2020-11-09 NOTE — Progress Notes (Signed)
Recreation Therapy Notes   Date: 11/09/2020  Time: 10:00 am   Location: Courtyard   Behavioral response: N/A   Intervention Topic: Anger Management     Discussion/Intervention: Patient did not attend group.   Clinical Observations/Feedback:  Patient did not attend group.   Davette Nugent LRT/CTRS        Merric Yost 11/09/2020 11:41 AM

## 2020-11-09 NOTE — Progress Notes (Signed)
Pt alert and oriented. Pt presents as calm and  Cooperative. Pt isolates in her room. Pt is quiet, flat affect, depressed mood, reports feelings of anxiety and depression 8/10. Pt denies SI/HI/AVH. Pt does not initiate interaction with staff or peers, answers assessment questions briefly with poor eye contact, and is often noted to be sitting in bed in the dark quietly. Will continue to monitor q 15 minute safety check.

## 2020-11-10 ENCOUNTER — Encounter: Payer: Self-pay | Admitting: Psychiatry

## 2020-11-10 ENCOUNTER — Inpatient Hospital Stay: Payer: BC Managed Care – PPO | Admitting: Anesthesiology

## 2020-11-10 LAB — GLUCOSE, CAPILLARY: Glucose-Capillary: 92 mg/dL (ref 70–99)

## 2020-11-10 MED ORDER — ONDANSETRON HCL 4 MG/2ML IJ SOLN: 4.0000 mg | Freq: Once | INTRAMUSCULAR | Status: DC | PRN

## 2020-11-10 MED ORDER — SUCCINYLCHOLINE CHLORIDE 20 MG/ML IJ SOLN
INTRAMUSCULAR | Status: DC | PRN
  Administered 2020-11-10: 70 mg via INTRAVENOUS

## 2020-11-10 MED ORDER — METHOHEXITAL SODIUM 100 MG/10ML IV SOSY
PREFILLED_SYRINGE | INTRAVENOUS | Status: DC | PRN
  Administered 2020-11-10: 50 mg via INTRAVENOUS

## 2020-11-10 MED ORDER — MIDAZOLAM HCL 2 MG/2ML IJ SOLN: 2.0000 mg | Freq: Once | INTRAMUSCULAR | Status: DC

## 2020-11-10 MED ORDER — SODIUM CHLORIDE 0.9 % IV SOLN: INTRAVENOUS | Status: DC | PRN

## 2020-11-10 MED ORDER — SUCCINYLCHOLINE CHLORIDE 200 MG/10ML IV SOSY
PREFILLED_SYRINGE | INTRAVENOUS | Status: AC
  Filled 2020-11-10: qty 10

## 2020-11-10 MED ORDER — SODIUM CHLORIDE 0.9 % IV SOLN: 500.0000 mL | Freq: Once | INTRAVENOUS | Status: AC

## 2020-11-10 NOTE — H&P (Signed)
Cristina Finley is an 45 y.o. female.   Chief Complaint: severe depression HPI: episode of severe psychotic depression  History reviewed. No pertinent past medical history.  History reviewed. No pertinent surgical history.  History reviewed. No pertinent family history. Social History:  reports that she has been smoking cigarettes. She has been smoking about 1.50 packs per day. She has never used smokeless tobacco. She reports current alcohol use of about 6.0 standard drinks of alcohol per week. She reports current drug use. Drug: Marijuana.  Allergies:  Allergies  Allergen Reactions  . Penicillins Hives and Nausea Only    Medications Prior to Admission  Medication Sig Dispense Refill  . citalopram (CELEXA) 20 MG tablet Take 20 mg by mouth daily.      Results for orders placed or performed during the hospital encounter of 10/19/20 (from the past 48 hour(s))  Glucose, capillary     Status: None   Collection Time: 11/10/20  6:31 AM  Result Value Ref Range   Glucose-Capillary 92 70 - 99 mg/dL    Comment: Glucose reference range applies only to samples taken after fasting for at least 8 hours.   Comment 1 Notify RN    No results found.  Review of Systems  Constitutional: Negative.   HENT: Negative.   Eyes: Negative.   Respiratory: Negative.   Cardiovascular: Negative.   Gastrointestinal: Negative.   Musculoskeletal: Negative.   Skin: Negative.   Neurological: Negative.   Psychiatric/Behavioral: Positive for dysphoric mood and sleep disturbance. The patient is nervous/anxious.     Blood pressure (!) 104/56, pulse 60, temperature 98.5 F (36.9 C), temperature source Oral, resp. rate 18, height 5\' 4"  (1.626 m), weight 47.6 kg, SpO2 (!) 8 %. Physical Exam Vitals and nursing note reviewed.  Constitutional:      Appearance: She is well-developed.  HENT:     Head: Normocephalic and atraumatic.  Eyes:     Conjunctiva/sclera: Conjunctivae normal.     Pupils: Pupils are equal,  round, and reactive to light.  Cardiovascular:     Heart sounds: Normal heart sounds.  Pulmonary:     Effort: Pulmonary effort is normal.  Abdominal:     Palpations: Abdomen is soft.  Musculoskeletal:        General: Normal range of motion.     Cervical back: Normal range of motion.  Skin:    General: Skin is warm and dry.  Neurological:     General: No focal deficit present.     Mental Status: She is alert.  Psychiatric:        Attention and Perception: She is inattentive.        Mood and Affect: Mood is depressed.        Speech: Speech is delayed.        Behavior: Behavior is slowed.        Thought Content: Thought content includes suicidal ideation. Thought content does not include suicidal plan.        Cognition and Memory: Cognition is impaired. Memory is impaired.      Assessment/Plan iitiate index treatment  , MD 11/10/2020, 12:20 PM

## 2020-11-10 NOTE — Transfer of Care (Signed)
Immediate Anesthesia Transfer of Care Note  Patient: Cristina Finley  Procedure(s) Performed: ECT TX  Patient Location: PACU  Anesthesia Type:General  Level of Consciousness: awake, alert  and oriented  Airway & Oxygen Therapy: Patient Spontanous Breathing and Patient connected to face mask oxygen  Post-op Assessment: Report given to RN and Post -op Vital signs reviewed and stable  Post vital signs: Reviewed and stable  Last Vitals:  Vitals Value Taken Time  BP 149/79 11/10/20 1251  Temp 36.6 C 11/10/20 1251  Pulse 73 11/10/20 1254  Resp 14 11/10/20 1254  SpO2 100 % 11/10/20 1254  Vitals shown include unvalidated device data.  Last Pain:  Vitals:   11/10/20 1251  TempSrc:   PainSc: Asleep         Complications: No complications documented.

## 2020-11-10 NOTE — Progress Notes (Signed)
Pt is alert and oriented x 4 post ECT. Vital signs stable. Pt received all missed medications from this morning due to NPO. Pt did refuse nicotine patch. Pt states she feels a little better and hopes that the procedures will help her. Pt remains safe on the unit with q 15 minute safety checks. Will continue to monitor.

## 2020-11-10 NOTE — Progress Notes (Signed)
Pt rates depression 9/10 and anxiety 7/10. Pt denies SI, HI and says "not today" when asked about AVH. Pt has a sullen affect but coloring in the day room. Torrie Mayers RN

## 2020-11-10 NOTE — [facility] Group Notes (Signed)
LCSW Group Therapy Note  11/10/2020 2:09 PM  Type of Therapy/Topic:  Group Therapy:  Emotion Regulation  Participation Level:  Did Not Attend   Description of Group:   The purpose of this group is to assist patients in learning to regulate negative emotions and experience positive emotions. Patients will be guided to discuss ways in which they have been vulnerable to their negative emotions. These vulnerabilities will be juxtaposed with experiences of positive emotions or situations, and patients will be challenged to use positive emotions to combat negative ones. Special emphasis will be placed on coping with negative emotions in conflict situations, and patients will process healthy conflict resolution skills.  Therapeutic Goals: 1. Patient will identify two positive emotions or experiences to reflect on in order to balance out negative emotions 2. Patient will label two or more emotions that they find the most difficult to experience 3. Patient will demonstrate positive conflict resolution skills through discussion and/or role plays  Summary of Patient Progress: X  Therapeutic Modalities:   Cognitive Behavioral Therapy Feelings Identification Dialectical Behavioral Therapy  Shaliyah Taite R. Numair Masden, MSW, LCSW, LCAS 11/10/2020 2:09 PM   

## 2020-11-10 NOTE — Plan of Care (Signed)
  Problem: Self-Concept: Goal: Level of anxiety will decrease Outcome: Progressing   Problem: Activity: Goal: Interest or engagement in leisure activities will improve Outcome: Progressing   Problem: Health Behavior/Discharge Planning: Goal: Ability to make decisions will improve Outcome: Progressing Goal: Compliance with therapeutic regimen will improve Outcome: Progressing

## 2020-11-10 NOTE — Progress Notes (Signed)
Patient alert and oriented x4, affect is blunted, thoughts are organized and coherenshewasvisible in the milieu, interactingwith peers and staff, she currentlydenies SI/HI/AVH,she rated depression a5/10 ( low 0 - 10 high ) speech issoft non pressured, she isreceptive to staff,she was complaint with medication regimen,15 minutes safety checks maintained, will continue to monitor.

## 2020-11-10 NOTE — Plan of Care (Signed)
  Problem: Depression Goal: STG - Patient will identify 3 positive coping skills to decrease depressive symptoms within 5 recreation therapy group sessions Description: STG - Patient will identify 3 positive coping skills to decrease depressive symptoms within 5 recreation therapy group sessions Outcome: Not Progressing   

## 2020-11-10 NOTE — Addendum Note (Signed)
Addendum  created 11/10/20 1343 by Karoline Caldwell, CRNA   Child order released for a procedure order, Clinical Note Signed, Intraprocedure Blocks edited

## 2020-11-10 NOTE — Anesthesia Procedure Notes (Signed)
Performed by: Nirav Sweda, CRNA Pre-anesthesia Checklist: Patient identified, Emergency Drugs available, Suction available and Patient being monitored Patient Re-evaluated:Patient Re-evaluated prior to induction Oxygen Delivery Method: Circle system utilized Preoxygenation: Pre-oxygenation with 100% oxygen Induction Type: IV induction Ventilation: Mask ventilation without difficulty and Mask ventilation throughout procedure Airway Equipment and Method: Bite block Placement Confirmation: positive ETCO2 Dental Injury: Teeth and Oropharynx as per pre-operative assessment        

## 2020-11-10 NOTE — Anesthesia Postprocedure Evaluation (Signed)
Anesthesia Post Note  Patient: Cristina Finley  Procedure(s) Performed: ECT TX  Patient location during evaluation: PACU Anesthesia Type: General Level of consciousness: awake and alert Pain management: pain level controlled Vital Signs Assessment: post-procedure vital signs reviewed and stable Respiratory status: spontaneous breathing and respiratory function stable Cardiovascular status: stable Anesthetic complications: no   No complications documented.   Last Vitals:  Vitals:   11/10/20 1024 11/10/20 1251  BP: (!) 104/56 (!) 149/79  Pulse: 60 79  Resp: 18 14  Temp: 36.9 C 36.6 C  SpO2: (!) 8% 100%    Last Pain:  Vitals:   11/10/20 1251  TempSrc:   PainSc: Asleep                 Cristina Finley K

## 2020-11-10 NOTE — Progress Notes (Signed)
Patient with flat, depressed affect. Minimal interaction with staff and peers. Medication compliant. Noted in dayroom most of dayroom hours. Did eat snack. Denies SI, HI, AVH. Pt noted washing clothes this pm. Encouragement and support provided. Safety checks maintained. Medications given as prescribed. Pt receptive and remains safe on unit with q 15 min checks.

## 2020-11-10 NOTE — Anesthesia Preprocedure Evaluation (Signed)
Anesthesia Evaluation  Patient identified by MRN, date of birth, ID band Patient awake    Reviewed: Allergy & Precautions, NPO status , Patient's Chart, lab work & pertinent test results  History of Anesthesia Complications Negative for: history of anesthetic complications  Airway Mallampati: II       Dental   Pulmonary neg sleep apnea, neg COPD, Current Smoker (none in three weeks),           Cardiovascular (-) hypertension(-) Past MI and (-) CHF (-) dysrhythmias (-) Valvular Problems/Murmurs     Neuro/Psych neg Seizures Anxiety Depression    GI/Hepatic Neg liver ROS, neg GERD  ,  Endo/Other  neg diabetes  Renal/GU negative Renal ROS     Musculoskeletal   Abdominal   Peds  Hematology   Anesthesia Other Findings   Reproductive/Obstetrics                             Anesthesia Physical Anesthesia Plan  ASA: II  Anesthesia Plan: General   Post-op Pain Management:    Induction: Intravenous  PONV Risk Score and Plan: 2  Airway Management Planned: Mask  Additional Equipment:   Intra-op Plan:   Post-operative Plan:   Informed Consent: I have reviewed the patients History and Physical, chart, labs and discussed the procedure including the risks, benefits and alternatives for the proposed anesthesia with the patient or authorized representative who has indicated his/her understanding and acceptance.       Plan Discussed with:   Anesthesia Plan Comments:         Anesthesia Quick Evaluation

## 2020-11-10 NOTE — Progress Notes (Signed)
Pt is alert and oriented. Pt is NPO for scheduled ECT. Pt did attempt to drink coffee but was reminded of procedure. Pt has a flat affect, reports depressed mood and anxiety. Pt. isolates in her room but comes out for meals and meds. Will continue to monitor pt per Q15 minute  checks and monitor for safety and progress.

## 2020-11-10 NOTE — Progress Notes (Signed)
Recreation Therapy Notes   Date: 11/10/2020  Time: 10:00 am   Location: Craft-room   Behavioral response: N/A   Intervention Topic: Relaxation   Discussion/Intervention: Patient did not attend group.   Clinical Observations/Feedback:  Patient did not attend group.   Mckinnley Cottier LRT/CTRS         Briseyda Fehr 11/10/2020 12:56 PM

## 2020-11-10 NOTE — Procedures (Signed)
ECT SERVICES Physician's Interval Evaluation & Treatment Note  Patient Identification: Cristina Finley MRN:  [id number] Date of Evaluation:  11/10/2020 TX #: 1  MADRS:   MMSE:   P.E. Findings:  Patient is underweight but otherwise in reasonable health no significant findings  Psychiatric Interval Note:  Extremely depressed down and sad and negative with delusional levels of depression  Subjective:  Patient is a 45 y.o. female seen for evaluation for Electroconvulsive Therapy. Major severe depressive symptoms  Treatment Summary:   [x]   Right Unilateral             []  Bilateral   % Energy : 0.3 ms 50%   Impedance: 2120 ohms  Seizure Energy Index: 11,088 V squared  Postictal Suppression Index: No reading but it visually looked good  Seizure Concordance Index:  75% Medications  Pre Shock: Brevital 50 mg succinylcholine 70 mg Post Shock:  Seizure Duration: 47 seconds EMG 96 seconds EEG   Comments: Follow-up on Friday  Lungs:  [x]   Clear to auscultation               []  Other:   Heart:    [x]   Regular rhythm             []  irregular rhythm    [x]   Previous H&P reviewed, patient examined and there are NO CHANGES                 []   Previous H&P reviewed, patient examined and there are changes noted.   2121, MD 6/1/20224:54 PM

## 2020-11-11 NOTE — Progress Notes (Signed)
[facility] MD Progress Note  11/11/2020 12:00 PM Cristina Finley  MRN:  [id number]  Principal Problem: MDD (major depressive disorder), recurrent, severe, with psychosis (HCC) Diagnosis: Principal Problem:   MDD (major depressive disorder), recurrent, severe, with psychosis (HCC) Active Problems:   Alcohol abuse   Nicotine abuse  Follow-up for Ms. Helmkamp; a 45 y.o. female who presents to the [facility] unit for depression and suicidal ideations.  Interval History Patient was seen today for re-evaluation.  Nursing reports no events overnight. ADLs intact.  Patient has been medication compliant.    Subjective: Patient seen and again today.  She is sitting in her room, but makes better eye contact today. She notes that she feel slightly better today. When asked to elaborate, she notes that her thoughts are less jumbled up, and she is less confused. She has some passive SI, but contracts for safety. Auditory hallucinations present, but less intense. Denies homicidal ideations or visual hallucinations. Patient was able to do her laundry overnight, and was observed in the dayroom coloring for first time in many days. She denies any headaches or muscle aches from ECT. She is agreeable to continued ECT treatments.   Labs: no new results for review.  Total Time spent with patient: 20 minutes  Past Psychiatric History: see H&P  Past Medical History: History reviewed. No pertinent past medical history. History reviewed. No pertinent surgical history. Family History: History reviewed. No pertinent family history. Family Psychiatric  History: see H&P Social History:  Social History   Substance and Sexual Activity  Alcohol Use Yes  . Alcohol/week: 6.0 standard drinks  . Types: 6 Cans of beer per week   Comment: one 6 pack daily     Social History   Substance and Sexual Activity  Drug Use Yes  . Types: Marijuana   Comment: last used 2 days ago per pt    Social History   Socioeconomic History  .  Marital status: Married    Spouse name: Not on file  . Number of children: Not on file  . Years of education: Not on file  . Highest education level: Not on file  Occupational History  . Not on file  Tobacco Use  . Smoking status: Current Every Day Smoker    Packs/day: 1.50    Types: Cigarettes  . Smokeless tobacco: Never Used  Vaping Use  . Vaping Use: Never used  Substance and Sexual Activity  . Alcohol use: Yes    Alcohol/week: 6.0 standard drinks    Types: 6 Cans of beer per week    Comment: one 6 pack daily  . Drug use: Yes    Types: Marijuana    Comment: last used 2 days ago per pt  . Sexual activity: Yes    Partners: Male    Birth control/protection: Surgical  Other Topics Concern  . Not on file  Social History Narrative  . Not on file   Social Determinants of Health   Financial Resource Strain: Not on file  Food Insecurity: Not on file  Transportation Needs: Not on file  Physical Activity: Not on file  Stress: Not on file  Social Connections: Not on file   Additional Social History:     Sleep: Fair  Appetite:  Poor  Current Medications: Current Facility-Administered Medications  Medication Dose Route Frequency Provider Last Rate Last Admin  . 0.9 %  sodium chloride infusion  500 mL Intravenous Once [name], [name], MD      . acetaminophen (TYLENOL) tablet  650 mg  650 mg Oral Q6H PRN [name], [name], MD      . alum & mag hydroxide-simeth (MAALOX/MYLANTA) 200-200-20 MG/5ML suspension 30 mL  30 mL Oral Q4H PRN [name], [name], MD      . escitalopram (LEXAPRO) tablet 20 mg  20 mg Oral QHS [name], MD   20 mg at 11/10/20 2106  . feeding supplement (ENSURE ENLIVE / ENSURE PLUS) liquid 237 mL  237 mL Oral BID BM [name], MD   237 mL at 11/11/20 1011  . guaiFENesin (ROBITUSSIN) 100 MG/5ML solution 200 mg  200 mg Oral Q4H PRN [name], MD   200 mg at 10/23/20 0800  . hydrOXYzine (ATARAX/VISTARIL) tablet 25 mg  25 mg Oral TID PRN [name],  [name], MD   25 mg at 10/20/20 1352  . magnesium hydroxide (MILK OF MAGNESIA) suspension 30 mL  30 mL Oral Daily PRN [name], [name], MD      . midazolam (VERSED) injection 2 mg  2 mg Intravenous Once [name], [name], MD      . multivitamin with minerals tablet 1 tablet  1 tablet Oral Daily [name], MD   1 tablet at 11/11/20 0810  . naltrexone (DEPADE) tablet 50 mg  50 mg Oral Daily [name], MD   50 mg at 11/11/20 0810  . nicotine (NICODERM CQ - dosed in mg/24 hours) patch 21 mg  21 mg Transdermal Daily [name], [name], MD   21 mg at 11/09/20 0826  . OLANZapine zydis (ZYPREXA) disintegrating tablet 20 mg  20 mg Oral QHS [name], MD   20 mg at 11/10/20 2105  . ondansetron (ZOFRAN) injection 4 mg  4 mg Intravenous Once PRN Naomie Dean, MD      . pneumococcal 23 valent vaccine (PNEUMOVAX-23) injection 0.5 mL  0.5 mL Intramuscular Tomorrow-1000 [name], MD      . thiamine tablet 100 mg  100 mg Oral Daily [name], MD   100 mg at 11/11/20 0810  . traZODone (DESYREL) tablet 50 mg  50 mg Oral QHS PRN [name], [name], MD   50 mg at 11/09/20 2117    Lab Results:  Results for orders placed or performed during the hospital encounter of 10/19/20 (from the past 48 hour(s))  Glucose, capillary     Status: None   Collection Time: 11/10/20  6:31 AM  Result Value Ref Range   Glucose-Capillary 92 70 - 99 mg/dL    Comment: Glucose reference range applies only to samples taken after fasting for at least 8 hours.   Comment 1 Notify RN     Blood Alcohol level:  Lab Results  Component Value Date   ETH <10 10/18/2020    Metabolic Disorder Labs: Lab Results  Component Value Date   HGBA1C 5.6 10/20/2020   MPG 114.02 10/20/2020   No results found for: PROLACTIN Lab Results  Component Value Date   CHOL 184 10/20/2020   TRIG 143 10/20/2020   HDL 51 10/20/2020   CHOLHDL 3.6 10/20/2020   VLDL 29 10/20/2020   LDLCALC 104 (H) 10/20/2020    Physical  Findings: AIMS: Facial and Oral Movements Muscles of Facial Expression: None, normal Lips and Perioral Area: None, normal Jaw: None, normal Tongue: None, normal,Extremity Movements Upper (arms, wrists, hands, fingers): None, normal Lower (legs, knees, ankles, toes): None, normal, Trunk Movements Neck, shoulders, hips: None, normal, Overall Severity Severity of abnormal movements (  highest score from questions above): None, normal Incapacitation due to abnormal movements: None, normal Patient's awareness of abnormal movements (rate only patient's report): No Awareness, Dental Status Current problems with teeth and/or dentures?: No Does patient usually wear dentures?: No  CIWA:  CIWA-Ar Total: 4 COWS:  COWS Total Score: 1  Musculoskeletal: Strength & Muscle Tone: within normal limits Gait & Station: normal Patient leans: N/A  Psychiatric Specialty Exam:  Presentation  General Appearance: Fairly Groomed  Eye Contact:Fair  Speech:Slow  Speech Volume:Decreased  Handedness:Right   Mood and Affect  Mood:Depressed  Affect:Congruent   Thought Process  Thought Processes:Coherent; Goal Directed  Descriptions of Associations:Intact  Orientation:Full (Time, Place and Person)  Thought Content:Paranoid Ideation  History of Schizophrenia/Schizoaffective disorder:No  Duration of Psychotic Symptoms:Less than six months  Hallucinations:Hallucinations: Auditory  Ideas of Reference:Paranoia  Suicidal Thoughts:Suicidal Thoughts: Yes, Passive  Homicidal Thoughts:Homicidal Thoughts: No   Sensorium  Memory:Immediate Fair; Recent Fair; Remote Fair  Judgment:Intact  Insight:Shallow   Executive Functions  Concentration:Fair  Attention Span:Poor  Recall:Poor  Fund of Knowledge:Fair  Language:Fair   Psychomotor Activity  Psychomotor Activity:Psychomotor Activity: Decreased   Assets  Assets:Desire for Improvement; Financial Resources/Insurance; Housing;  Intimacy; Physical Health; Social Support   Sleep  Sleep: Good, 8.25   Physical Exam: Physical Exam  ROS  Blood pressure 102/63, pulse 61, temperature 98.4 F (36.9 C), temperature source Oral, resp. rate 17, height 5\' 4"  (1.626 m), weight 47.6 kg, SpO2 98 %. Body mass index is 18.02 kg/m.   Treatment Plan Summary: Daily contact with patient to assess and evaluate symptoms and progress in treatment and Medication management  Patient is a 45 year old female/female with the above-stated past psychiatric history who is seen in follow-up.  Chart reviewed. Patient discussed with nursing. She continues to have passive suicidal ideations and auditory hallucinations, and paranoia. She has made some improvement since starting ECT.  Continue Zyprexa 20 mg QHS, Lexapro 20 mg QHS for mood. Continue Naltrexone 50 mg daily for alcohol use disorder, nicoderm 21 mg patch daily for tobacco use disorder.  Ativan has been discontinued for ECT treatments. She has completed her initial treatment of ECT yesterday, and consents to continued treatment tomorrow.    Plan:  -continue inpatient psych admission; 15-minute checks; daily contact with patient to assess and evaluate symptoms and progress in treatment; psychoeducation.  -continue scheduled medications: . escitalopram  20 mg Oral QHS  . feeding supplement  237 mL Oral BID BM  . midazolam  2 mg Intravenous Once  . multivitamin with minerals  1 tablet Oral Daily  . naltrexone  50 mg Oral Daily  . nicotine  21 mg Transdermal Daily  . OLANZapine zydis  20 mg Oral QHS  . pneumococcal 23 valent vaccine  0.5 mL Intramuscular Tomorrow-1000  . thiamine  100 mg Oral Daily    -continue PRN medications.  acetaminophen, alum & mag hydroxide-simeth, guaiFENesin, hydrOXYzine, magnesium hydroxide, ondansetron (ZOFRAN) IV, traZODone  -Disposition: Patient is not stable for discharge yet.  -  I certify that the patient does need, on a daily basis, active  treatment furnished directly by or requiring the supervision of inpatient psychiatric facility personnel.   11/11/20: Psychiatric exam above reviewed and remains accurate. Assessment and plan above reviewed and updated.    01/11/21, MD 11/11/2020, 12:00 PM

## 2020-11-11 NOTE — [facility] Group Notes (Signed)
LCSW Group Therapy Note  11/11/2020 2:14 PM  Type of Therapy/Topic:  Group Therapy:  Balance in Life  Participation Level:  None  Description of Group:    This group will address the concept of balance and how it feels and looks when one is unbalanced. Patients will be encouraged to process areas in their lives that are out of balance and identify reasons for remaining unbalanced. Facilitators will guide patients in utilizing problem-solving interventions to address and correct the stressor making their life unbalanced. Understanding and applying boundaries will be explored and addressed for obtaining and maintaining a balanced life. Patients will be encouraged to explore ways to assertively make their unbalanced needs known to significant others in their lives, using other group members and facilitator for support and feedback.  Therapeutic Goals: 1. Patient will identify two or more emotions or situations they have that consume much of in their lives. 2. Patient will identify signs/triggers that life has become out of balance:  3. Patient will identify two ways to set boundaries in order to achieve balance in their lives:  4. Patient will demonstrate ability to communicate their needs through discussion and/or role plays  Summary of Patient Progress: Patient attended group, however, did not participate in discussion.  Patient appeared alert and oriented.   Therapeutic Modalities:   Cognitive Behavioral Therapy Solution-Focused Therapy Assertiveness Training  Penni Homans MSW, [place] 11/11/2020 2:14 PM

## 2020-11-11 NOTE — Progress Notes (Signed)
Recreation Therapy Notes    Date: 11/11/2020  Time: 10:00 am   Location: Craft room   Behavioral response: Appropriate  Intervention Topic: Communication   Discussion/Intervention:  Group content today was focused on communication. The group defined communication and ways to communicate with others. Individuals stated reason why communication is important and some reasons to communicate with others. Patients expressed if they thought they were good at communicating with others and ways they could improve their communication skills. The group identified important parts of communication and some experiences they have had in the past with communication. The group participated in the intervention "What is that?", where they had a chance to test out their communication skills and identify ways to improve their communication techniques.  Clinical Observations/Feedback: Patient came to group and was focused on what peers and staff had to say about communication.  Individual was social with staff and peers while participating in the intervention. Honor Fairbank LRT/CTRS          Jian Hodgman 11/11/2020 11:47 AM

## 2020-11-11 NOTE — Progress Notes (Signed)
Patient alert and oriented x 4. Pt answered assessment questions positively with a few smiles. Pt stated she feels better today and she hopes she will continue to feel better. Pt interacted with peers in group.  Medication compliant. Noted in dayroom most of dayroom hours and using the telephone. Denies SI, HI, AVH. Encouragement and support provided. Safety checks maintained. Medications given as prescribed. Pt receptive and remains safe on unit with q 15 min checks

## 2020-11-12 ENCOUNTER — Inpatient Hospital Stay: Payer: BC Managed Care – PPO | Admitting: Anesthesiology

## 2020-11-12 ENCOUNTER — Other Ambulatory Visit: Payer: Self-pay | Admitting: Psychiatry

## 2020-11-12 ENCOUNTER — Ambulatory Visit
Admission: RE | Admit: 2020-11-12 | Discharge: 2020-11-12 | Disposition: A | Discharge status: Home / Self Care | Payer: BC Managed Care – PPO | Source: Ambulatory Visit | Attending: Psychiatry | Admitting: Psychiatry

## 2020-11-12 DIAGNOSIS — F333 Major depressive disorder, recurrent, severe with psychotic symptoms: Secondary | ICD-10-CM

## 2020-11-12 DIAGNOSIS — F323 Major depressive disorder, single episode, severe with psychotic features: Secondary | ICD-10-CM | POA: Diagnosis not present

## 2020-11-12 DIAGNOSIS — F1290 Cannabis use, unspecified, uncomplicated: Secondary | ICD-10-CM | POA: Insufficient documentation

## 2020-11-12 DIAGNOSIS — F17210 Nicotine dependence, cigarettes, uncomplicated: Secondary | ICD-10-CM | POA: Insufficient documentation

## 2020-11-12 DIAGNOSIS — R569 Unspecified convulsions: Secondary | ICD-10-CM | POA: Insufficient documentation

## 2020-11-12 DIAGNOSIS — Z880 Allergy status to penicillin: Secondary | ICD-10-CM | POA: Insufficient documentation

## 2020-11-12 LAB — GLUCOSE, CAPILLARY: Glucose-Capillary: 88 mg/dL (ref 70–99)

## 2020-11-12 MED ORDER — MIDAZOLAM HCL 2 MG/2ML IJ SOLN
INTRAMUSCULAR | Status: AC
  Filled 2020-11-12: qty 2

## 2020-11-12 MED ORDER — SODIUM CHLORIDE 0.9 % IV SOLN
500.0000 mL | Freq: Once | INTRAVENOUS | Status: AC
  Administered 2020-11-12: 500 mL via INTRAVENOUS

## 2020-11-12 MED ORDER — METHOHEXITAL SODIUM 100 MG/10ML IV SOSY
PREFILLED_SYRINGE | INTRAVENOUS | Status: DC | PRN
  Administered 2020-11-12: 20 mg via INTRAVENOUS
  Administered 2020-11-12: 50 mg via INTRAVENOUS

## 2020-11-12 MED ORDER — SUCCINYLCHOLINE CHLORIDE 20 MG/ML IJ SOLN
INTRAMUSCULAR | Status: DC | PRN
  Administered 2020-11-12: 70 mg via INTRAVENOUS

## 2020-11-12 NOTE — Transfer of Care (Signed)
Immediate Anesthesia Transfer of Care Note  Patient: Cristina Finley  Procedure(s) Performed: ECT TX  Patient Location: PACU  Anesthesia Type:General  Level of Consciousness: awake, alert  and oriented  Airway & Oxygen Therapy: Patient Spontanous Breathing and Patient connected to face mask oxygen  Post-op Assessment: Report given to RN and Post -op Vital signs reviewed and stable  Post vital signs: Reviewed and stable  Last Vitals:  Vitals Value Taken Time  BP    Temp 37.1 C 11/12/20 1251  Pulse 77 11/12/20 1251  Resp 15 11/12/20 1251  SpO2 96 % 11/12/20 1251  Vitals shown include unvalidated device data.  Last Pain:  Vitals:   11/12/20 1006  TempSrc: Oral  PainSc: 0-No pain         Complications: No complications documented.

## 2020-11-12 NOTE — Progress Notes (Signed)
Recreation Therapy Notes  Date: 11/12/2020  Time: 10:00 am   Location: Court yard   Behavioral response: N/A   Intervention Topic: Leisure    Discussion/Intervention: Patient did not attend group.   Clinical Observations/Feedback:  Patient did not attend group.   [name] LRT/CTRS        Cristina Finley 11/12/2020 11:38 AM 

## 2020-11-12 NOTE — H&P (Signed)
Cristina Finley is an 45 y.o. female.   Chief Complaint: continued depression HPI: severe depression with psychosis  History reviewed. No pertinent past medical history.  History reviewed. No pertinent surgical history.  History reviewed. No pertinent family history. Social History:  reports that she has been smoking cigarettes. She has been smoking about 1.50 packs per day. She has never used smokeless tobacco. She reports current alcohol use of about 6.0 standard drinks of alcohol per week. She reports current drug use. Drug: Marijuana.  Allergies:  Allergies  Allergen Reactions  . Penicillins Hives and Nausea Only    (Not in a hospital admission)   Results for orders placed or performed during the hospital encounter of 10/19/20 (from the past 48 hour(s))  Glucose, capillary     Status: None   Collection Time: 11/12/20  6:14 AM  Result Value Ref Range   Glucose-Capillary 88 70 - 99 mg/dL    Comment: Glucose reference range applies only to samples taken after fasting for at least 8 hours.   No results found.  Review of Systems  Constitutional: Negative.   HENT: Negative.   Eyes: Negative.   Respiratory: Negative.   Cardiovascular: Negative.   Gastrointestinal: Negative.   Musculoskeletal: Negative.   Skin: Negative.   Neurological: Negative.   Psychiatric/Behavioral: Positive for dysphoric mood.    Blood pressure (!) 107/59, pulse 70, temperature 98.4 F (36.9 C), temperature source Oral, resp. rate 18, height 5\' 4"  (1.626 m), weight 47.6 kg, SpO2 98 %. Physical Exam Vitals and nursing note reviewed.  Constitutional:      Appearance: She is well-developed.  HENT:     Head: Normocephalic and atraumatic.  Eyes:     Conjunctiva/sclera: Conjunctivae normal.     Pupils: Pupils are equal, round, and reactive to light.  Cardiovascular:     Heart sounds: Normal heart sounds.  Pulmonary:     Effort: Pulmonary effort is normal.  Abdominal:     Palpations: Abdomen is  soft.  Musculoskeletal:        General: Normal range of motion.     Cervical back: Normal range of motion.  Skin:    General: Skin is warm and dry.  Neurological:     General: No focal deficit present.     Mental Status: She is alert.  Psychiatric:        Attention and Perception: She is inattentive.        Mood and Affect: Affect is blunt.        Speech: Speech is delayed.        Behavior: Behavior is slowed.        Thought Content: Thought content is paranoid. Thought content does not include suicidal ideation.        Cognition and Memory: Memory is impaired.        Judgment: Judgment normal.      Assessment/Plan Continue index treatment pplan  , MD 11/12/2020, 12:20 PM

## 2020-11-12 NOTE — Progress Notes (Signed)
D: Pt denies SI/HI/AVH, she is pleasant and cooperative,her  thoughts are organized, she stated she feels better from doing ECT, she appears less anxious and he is interacting with peers and staff appropriately.  A: Pt was offered support and encouragement, she was given scheduled medications, and  encouraged to attend groups. Q 15 minute checks were done for safety.  R:Pt attends groups and interacts appropriately with peers and staff. Pt is complaint with medication. Pt has no complaints,  she's receptive to treatment. 15,minutes and safety checks maintained on unit will continue to monitor.

## 2020-11-12 NOTE — Anesthesia Preprocedure Evaluation (Signed)
Anesthesia Evaluation  Patient identified by MRN, date of birth, ID band Patient awake    Reviewed: Allergy & Precautions, NPO status , Patient's Chart, lab work & pertinent test results  History of Anesthesia Complications Negative for: history of anesthetic complications  Airway Mallampati: III  TM Distance: >3 FB Neck ROM: Full    Dental no notable dental hx.    Pulmonary neg sleep apnea, neg COPD, neg recent URI, Current Smoker and Patient abstained from smoking.,    breath sounds clear to auscultation- rhonchi (-) wheezing      Cardiovascular Exercise Tolerance: Good (-) hypertension(-) CAD, (-) Past MI, (-) Cardiac Stents and (-) CABG  Rhythm:Regular Rate:Normal - Systolic murmurs and - Diastolic murmurs    Neuro/Psych neg Seizures PSYCHIATRIC DISORDERS Anxiety Depression negative neurological ROS     GI/Hepatic negative GI ROS, Neg liver ROS,   Endo/Other  negative endocrine ROSneg diabetes  Renal/GU negative Renal ROS     Musculoskeletal negative musculoskeletal ROS (+)   Abdominal (+) - obese,   Peds  Hematology negative hematology ROS (+)   Anesthesia Other Findings    Reproductive/Obstetrics                             Anesthesia Physical Anesthesia Plan  ASA: II  Anesthesia Plan: General   Post-op Pain Management:    Induction: Intravenous  PONV Risk Score and Plan: 1 and Ondansetron  Airway Management Planned: Mask  Additional Equipment:   Intra-op Plan:   Post-operative Plan:   Informed Consent: I have reviewed the patients History and Physical, chart, labs and discussed the procedure including the risks, benefits and alternatives for the proposed anesthesia with the patient or authorized representative who has indicated his/her understanding and acceptance.     Dental advisory given  Plan Discussed with: CRNA and Anesthesiologist  Anesthesia Plan  Comments:         Anesthesia Quick Evaluation

## 2020-11-12 NOTE — Anesthesia Procedure Notes (Signed)
Date/Time: 11/12/2020 12:41 PM Performed by: Junious Silk, CRNA Pre-anesthesia Checklist: Patient identified, Emergency Drugs available, Suction available, Patient being monitored and Timeout performed Ventilation: Mask ventilation without difficulty

## 2020-11-12 NOTE — Anesthesia Postprocedure Evaluation (Signed)
Anesthesia Post Note  Patient: Cristina Finley  Procedure(s) Performed: ECT TX  Patient location during evaluation: PACU Anesthesia Type: General Level of consciousness: awake and alert and oriented Pain management: pain level controlled Vital Signs Assessment: post-procedure vital signs reviewed and stable Respiratory status: spontaneous breathing, nonlabored ventilation and respiratory function stable Cardiovascular status: blood pressure returned to baseline and stable Postop Assessment: no signs of nausea or vomiting Anesthetic complications: no   No complications documented.   Last Vitals:  Vitals:   11/12/20 1310 11/12/20 1320  BP: 105/62 107/72  Pulse: 76   Resp: 16   Temp:  37.1 C  SpO2: 98%     Last Pain:  Vitals:   11/12/20 1320  TempSrc:   PainSc: 0-No pain                 Farha Dano

## 2020-11-12 NOTE — [facility] Group Notes (Signed)
LCSW Group Therapy Note     11/12/2020 2:17 PM     Type of Therapy and Topic:  Group Therapy:  Feelings around Relapse and Recovery     Participation Level:  Did Not Attend     Description of Group:    Patients in this group will discuss emotions they experience before and after a relapse. They will process how experiencing these feelings, or avoidance of experiencing them, relates to having a relapse. Facilitator will guide patients to explore emotions they have related to recovery. Patients will be encouraged to process which emotions are more powerful. They will be guided to discuss the emotional reaction significant others in their lives may have to their relapse or recovery. Patients will be assisted in exploring ways to respond to the emotions of others without this contributing to a relapse.     Therapeutic Goals:  1.    Patient will identify two or more emotions that lead to a relapse for them  2.    Patient will identify two emotions that result when they relapse  3.    Patient will identify two emotions related to recovery  4.    Patient will demonstrate ability to communicate their needs through discussion and/or role plays        Summary of Patient Progress:  X  Therapeutic Modalities:   Cognitive Behavioral Therapy  Solution-Focused Therapy  Assertiveness Training  Relapse Prevention Therapy        Audryana Hockenberry [place], MSW, LCSW-A  11/12/2020 2:17 PM

## 2020-11-12 NOTE — Progress Notes (Signed)
D: Pt alert and oriented. Pt denies experiencing any depression at this time. Pt reports experiencing a little bit of anxiety r/t going to have ECT done today but states she's okay. Pt denies experiencing any pain at this time. Pt denies experiencing any SI/HI, or AVH at this time.   Pt did well with ECT today and appears to be improving in mood/appearance. Pt is observed actively in the milieu.   A: Scheduled medications administered to pt, per MD orders. Support and encouragement provided. Frequent verbal contact made. Routine safety checks conducted q15 minutes.   R: No adverse drug reactions noted. Pt verbally contracts for safety at this time. Pt complaint with medications and treatment plan. Pt interacts well with others on the unit. Pt remains safe at this time. Will continue to monitor.

## 2020-11-13 NOTE — Progress Notes (Signed)
D: Pt alert and oriented. Pt rates depression 7/10, hopelessness 5/10, and anxiety 6/10. Pt goal: "Focus and staying out of room some, talk to family." Pt reports energy level as normal and concentration as being good. Pt reports sleep last night as being good. Pt did not receive medications for sleep. Pt denies experiencing any pain at this time. Pt denies experiencing any SI/HI, or AVH at this time.   Pt attended CSW group and has been observed in milieu interacting with others.  A: Scheduled medications administered to pt, per MD orders. Support and encouragement provided. Frequent verbal contact made. Routine safety checks conducted q15 minutes.   R: No adverse drug reactions noted. Pt verbally contracts for safety at this time. Pt complaint with medications and treatment plan. Pt interacts well with others on the unit. Pt remains safe at this time. Will continue to monitor.

## 2020-11-13 NOTE — [facility] Group Notes (Signed)
LCSW Group Therapy Note  11/13/2020 2:49 PM  Type of Therapy and Topic:  Group Therapy:  Feelings around Relapse and Recovery  Participation Level:  Minimal   Description of Group:    Patients in this group will discuss emotions they experience before and after a relapse. They will process how experiencing these feelings, or avoidance of experiencing them, relates to having a relapse. Facilitator will guide patients to explore emotions they have related to recovery. Patients will be encouraged to process which emotions are more powerful. They will be guided to discuss the emotional reaction significant others in their lives may have to their relapse or recovery. Patients will be assisted in exploring ways to respond to the emotions of others without this contributing to a relapse.  Therapeutic Goals: 1. Patient will identify two or more emotions that lead to a relapse for them 2. Patient will identify two emotions that result when they relapse 3. Patient will identify two emotions related to recovery 4. Patient will demonstrate ability to communicate their needs through discussion and/or role plays   Summary of Patient Progress: Patient was present for the entirety of the group session. Patient seldomly shared, remained mostly silent throughout group. Patient was respectful to others sharing their thoughts.    Therapeutic Modalities:   Cognitive Behavioral Therapy Solution-Focused Therapy Assertiveness Training Relapse Prevention Therapy   Gwenevere Ghazi, MSW, [place], [place] 11/13/2020 2:49 PM

## 2020-11-13 NOTE — Progress Notes (Signed)
[facility] MD Progress Note  11/13/2020 1:50 PM Cristina Finley  MRN:  [id number] Subjective: Follow-up patient with severe depression with psychotic features.  Patient seen today chart reviewed.  She is dramatically improved.  Does not report a sense of feeling depressed or hopeless.  No longer having any evidence of delusional thinking.  No active suicidal thoughts.  Minor soreness from the ECT otherwise no significant side effects.  Physically doing well. Principal Problem: MDD (major depressive disorder), recurrent, severe, with psychosis (HCC) Diagnosis: Principal Problem:   MDD (major depressive disorder), recurrent, severe, with psychosis (HCC) Active Problems:   Alcohol abuse   Nicotine abuse  Total Time spent with patient: 30 minutes  Past Psychiatric History: Patient has a history of depression and this is her first ECT use  Past Medical History: History reviewed. No pertinent past medical history. History reviewed. No pertinent surgical history. Family History: History reviewed. No pertinent family history. Family Psychiatric  History: See previous Social History:  Social History   Substance and Sexual Activity  Alcohol Use Yes  . Alcohol/week: 6.0 standard drinks  . Types: 6 Cans of beer per week   Comment: one 6 pack daily     Social History   Substance and Sexual Activity  Drug Use Yes  . Types: Marijuana   Comment: last used 2 days ago per pt    Social History   Socioeconomic History  . Marital status: Married    Spouse name: Not on file  . Number of children: Not on file  . Years of education: Not on file  . Highest education level: Not on file  Occupational History  . Not on file  Tobacco Use  . Smoking status: Current Every Day Smoker    Packs/day: 1.50    Types: Cigarettes  . Smokeless tobacco: Never Used  Vaping Use  . Vaping Use: Never used  Substance and Sexual Activity  . Alcohol use: Yes    Alcohol/week: 6.0 standard drinks    Types: 6 Cans of beer  per week    Comment: one 6 pack daily  . Drug use: Yes    Types: Marijuana    Comment: last used 2 days ago per pt  . Sexual activity: Yes    Partners: Male    Birth control/protection: Surgical  Other Topics Concern  . Not on file  Social History Narrative  . Not on file   Social Determinants of Health   Financial Resource Strain: Not on file  Food Insecurity: Not on file  Transportation Needs: Not on file  Physical Activity: Not on file  Stress: Not on file  Social Connections: Not on file   Additional Social History:                         Sleep: Fair  Appetite:  Fair  Current Medications: Current Facility-Administered Medications  Medication Dose Route Frequency Provider Last Rate Last Admin  . acetaminophen (TYLENOL) tablet 650 mg  650 mg Oral Q6H PRN Shreeya Recendiz T, MD      . alum & mag hydroxide-simeth (MAALOX/MYLANTA) 200-200-20 MG/5ML suspension 30 mL  30 mL Oral Q4H PRN Codi Kertz T, MD      . escitalopram (LEXAPRO) tablet 20 mg  20 mg Oral QHS [name], MD   20 mg at 11/12/20 2117  . feeding supplement (ENSURE ENLIVE / ENSURE PLUS) liquid 237 mL  237 mL Oral BID BM [name], MD  237 mL at 11/13/20 1027  . guaiFENesin (ROBITUSSIN) 100 MG/5ML solution 200 mg  200 mg Oral Q4H PRN [name], MD   200 mg at 10/23/20 0800  . hydrOXYzine (ATARAX/VISTARIL) tablet 25 mg  25 mg Oral TID PRN Venus Gilles, [name], MD   25 mg at 11/13/20 0802  . magnesium hydroxide (MILK OF MAGNESIA) suspension 30 mL  30 mL Oral Daily PRN Vergie Zahm T, MD      . midazolam (VERSED) injection 2 mg  2 mg Intravenous Once Corrion Stirewalt T, MD      . multivitamin with minerals tablet 1 tablet  1 tablet Oral Daily [name], MD   1 tablet at 11/13/20 0802  . naltrexone (DEPADE) tablet 50 mg  50 mg Oral Daily [name], MD   50 mg at 11/13/20 0802  . nicotine (NICODERM CQ - dosed in mg/24 hours) patch 21 mg  21 mg Transdermal Daily Arlyss Weathersby, [name], MD    21 mg at 11/13/20 0804  . OLANZapine zydis (ZYPREXA) disintegrating tablet 20 mg  20 mg Oral QHS [name], MD   20 mg at 11/12/20 2117  . ondansetron (ZOFRAN) injection 4 mg  4 mg Intravenous Once PRN Naomie Dean, MD      . pneumococcal 23 valent vaccine (PNEUMOVAX-23) injection 0.5 mL  0.5 mL Intramuscular Tomorrow-1000 [name], MD      . thiamine tablet 100 mg  100 mg Oral Daily [name], MD   100 mg at 11/13/20 0802  . traZODone (DESYREL) tablet 50 mg  50 mg Oral QHS PRN Marella Vanderpol, [name], MD   50 mg at 11/12/20 2117    Lab Results:  Results for orders placed or performed during the hospital encounter of 10/19/20 (from the past 48 hour(s))  Glucose, capillary     Status: None   Collection Time: 11/12/20  6:14 AM  Result Value Ref Range   Glucose-Capillary 88 70 - 99 mg/dL    Comment: Glucose reference range applies only to samples taken after fasting for at least 8 hours.    Blood Alcohol level:  Lab Results  Component Value Date   ETH <10 10/18/2020    Metabolic Disorder Labs: Lab Results  Component Value Date   HGBA1C 5.6 10/20/2020   MPG 114.02 10/20/2020   No results found for: PROLACTIN Lab Results  Component Value Date   CHOL 184 10/20/2020   TRIG 143 10/20/2020   HDL 51 10/20/2020   CHOLHDL 3.6 10/20/2020   VLDL 29 10/20/2020   LDLCALC 104 (H) 10/20/2020    Physical Findings: AIMS: Facial and Oral Movements Muscles of Facial Expression: None, normal Lips and Perioral Area: None, normal Jaw: None, normal Tongue: None, normal,Extremity Movements Upper (arms, wrists, hands, fingers): None, normal Lower (legs, knees, ankles, toes): None, normal, Trunk Movements Neck, shoulders, hips: None, normal, Overall Severity Severity of abnormal movements (highest score from questions above): None, normal Incapacitation due to abnormal movements: None, normal Patient's awareness of abnormal movements (rate only patient's report): No  Awareness, Dental Status Current problems with teeth and/or dentures?: No Does patient usually wear dentures?: No  CIWA:  CIWA-Ar Total: 4 COWS:  COWS Total Score: 1  Musculoskeletal: Strength & Muscle Tone: within normal limits Gait & Station: normal Patient leans: N/A  Psychiatric Specialty Exam:  Presentation  General Appearance: Fairly Groomed  Eye Contact:Fair  Speech:Slow  Speech Volume:Decreased  Handedness:Right   Mood and Affect  Mood:Depressed  Affect:Congruent   Thought Process  Thought Processes:Coherent; Goal Directed  Descriptions of Associations:Intact  Orientation:Full (Time, Place and Person)  Thought Content:Paranoid Ideation  History of Schizophrenia/Schizoaffective disorder:No  Duration of Psychotic Symptoms:Less than six months  Hallucinations:No data recorded Ideas of Reference:Paranoia  Suicidal Thoughts:No data recorded Homicidal Thoughts:No data recorded  Sensorium  Memory:Immediate Fair; Recent Fair; Remote Fair  Judgment:Intact  Insight:Shallow   Executive Functions  Concentration:Fair  Attention Span:Poor  Recall:Poor  Fund of Knowledge:Fair  Language:Fair   Psychomotor Activity  Psychomotor Activity:No data recorded  Assets  Assets:Desire for Improvement; Financial Resources/Insurance; Housing; Intimacy; Physical Health; Social Support   Sleep  Sleep:No data recorded   Physical Exam: Physical Exam Vitals and nursing note reviewed.  Constitutional:      Appearance: Normal appearance.  HENT:     Head: Normocephalic and atraumatic.     Mouth/Throat:     Pharynx: Oropharynx is clear.  Eyes:     Pupils: Pupils are equal, round, and reactive to light.  Cardiovascular:     Rate and Rhythm: Normal rate and regular rhythm.  Pulmonary:     Effort: Pulmonary effort is normal.     Breath sounds: Normal breath sounds.  Abdominal:     General: Abdomen is flat.     Palpations: Abdomen is soft.   Musculoskeletal:        General: Normal range of motion.  Skin:    General: Skin is warm and dry.  Neurological:     General: No focal deficit present.     Mental Status: She is alert. Mental status is at baseline.  Psychiatric:        Mood and Affect: Mood normal.        Thought Content: Thought content normal.    Review of Systems  Constitutional: Negative.   HENT: Negative.   Eyes: Negative.   Respiratory: Negative.   Cardiovascular: Negative.   Gastrointestinal: Negative.   Musculoskeletal: Negative.   Skin: Negative.   Neurological: Negative.   Psychiatric/Behavioral: Positive for memory loss. Negative for depression and suicidal ideas. The patient is nervous/anxious.    Blood pressure 102/67, pulse 64, temperature 98.4 F (36.9 C), temperature source Oral, resp. rate 18, height 5\' 4"  (1.626 m), weight 47.6 kg, SpO2 95 %. Body mass index is 18.02 kg/m.   Treatment Plan Summary: Medication management and Plan Patient much improved even with 2 treatments.  Very good prognostic sign.  Discussed options with patient including possible recommendation to discharge on Monday after ECT and continue with outpatient treatment.  She seems to think her family would be able to manage that.  No change to medicine for today.  Reviewed total treatment plan.  We will follow up tomorrow.  She is going to groups today and interacting with others in ways that are different than the rest of her stay here.  Sunday, MD 11/13/2020, 1:50 PM

## 2020-11-13 NOTE — Procedures (Signed)
ECT SERVICES Physician's Interval Evaluation & Treatment Note  Patient Identification: Cristina Finley MRN:  [id number] Date of Evaluation:  11/13/2020 TX #: 2  MADRS:   MMSE:   P.E. Findings:  No change to physical exam  Psychiatric Interval Note:  Slightly more alert and energetic but still depressed and confused  Subjective:  Patient is a 45 y.o. female seen for evaluation for Electroconvulsive Therapy. Possibly a little more energetic  Treatment Summary:   [x]   Right Unilateral             []  Bilateral   % Energy : 0.3 ms 50%   Impedance: 1960 ohms  Seizure Energy Index: 7351 V squared  Postictal Suppression Index: 62%  Seizure Concordance Index: 91%  Medications  Pre Shock: Robinul 0.1 mg Brevital 70 mg succinylcholine 70 mg  Post Shock: Versed 2 mg  Seizure Duration: 22 seconds EMG 52 seconds EEG   Comments: Continue index treatment  Lungs:  [x]   Clear to auscultation               []  Other:   Heart:    [x]   Regular rhythm             []  irregular rhythm    [x]   Previous H&P reviewed, patient examined and there are NO CHANGES                 []   Previous H&P reviewed, patient examined and there are changes noted.   , MD 6/4/20221:39 PM

## 2020-11-13 NOTE — Progress Notes (Signed)
Patient has been calm and cooperative. Mood is euthymic. Affect is bright. Playing cards with peers on the unit. Denies SI HI and AVH

## 2020-11-13 NOTE — Plan of Care (Signed)
  Problem: Education: Goal: Ability to state activities that reduce stress will improve Outcome: Progressing   Problem: Coping: Goal: Ability to identify and develop effective coping behavior will improve Outcome: Progressing   Problem: Self-Concept: Goal: Ability to identify factors that promote anxiety will improve Outcome: Progressing Goal: Level of anxiety will decrease Outcome: Progressing Goal: Ability to modify response to factors that promote anxiety will improve Outcome: Progressing   Problem: Education: Goal: Utilization of techniques to improve thought processes will improve Outcome: Progressing Goal: Knowledge of the prescribed therapeutic regimen will improve Outcome: Progressing   Problem: Activity: Goal: Interest or engagement in leisure activities will improve Outcome: Progressing Goal: Imbalance in normal sleep/wake cycle will improve Outcome: Progressing   Problem: Coping: Goal: Coping ability will improve Outcome: Progressing Goal: Will verbalize feelings Outcome: Progressing   Problem: Health Behavior/Discharge Planning: Goal: Ability to make decisions will improve Outcome: Progressing Goal: Compliance with therapeutic regimen will improve Outcome: Progressing   Problem: Role Relationship: Goal: Will demonstrate positive changes in social behaviors and relationships Outcome: Progressing   Problem: Safety: Goal: Ability to disclose and discuss suicidal ideas will improve Outcome: Progressing Goal: Ability to identify and utilize support systems that promote safety will improve Outcome: Progressing   Problem: Self-Concept: Goal: Will verbalize positive feelings about self Outcome: Progressing Goal: Level of anxiety will decrease Outcome: Progressing   

## 2020-11-14 MED ORDER — TRAZODONE HCL 50 MG PO TABS: 50.0000 mg | ORAL_TABLET | Freq: Every evening | ORAL | 2 refills | Status: DC | PRN

## 2020-11-14 MED ORDER — ESCITALOPRAM OXALATE 10 MG PO TABS: 20.0000 mg | ORAL_TABLET | Freq: Every day | ORAL | 2 refills | Status: DC

## 2020-11-14 MED ORDER — NALTREXONE HCL 50 MG PO TABS: 50.0000 mg | ORAL_TABLET | Freq: Every day | ORAL | 2 refills | Status: DC

## 2020-11-14 MED ORDER — OLANZAPINE 20 MG PO TBDP: 20.0000 mg | ORAL_TABLET | Freq: Every day | ORAL | 2 refills | Status: DC

## 2020-11-14 NOTE — [facility] Group Notes (Signed)
[facility] LCSW Group Therapy Note  Date/Time:  11/14/2020 1:22 PM- 2:16 PM   Type of Therapy and Topic:  Group Therapy:  Healthy and Unhealthy Supports  Participation Level:  None   Description of Group:  Patients in this group were introduced to the idea of adding a variety of healthy supports to address the various needs in their lives.Patients discussed what additional healthy supports could be helpful in their recovery and wellness after discharge in order to prevent future hospitalizations.   An emphasis was placed on using counselor, doctor, therapy groups, 12-step groups, and problem-specific support groups to expand supports.  They also worked as a group on developing a specific plan for several patients to deal with unhealthy supports through boundary-setting, psychoeducation with loved ones, and even termination of relationships.   Therapeutic Goals:   1)  discuss importance of adding supports to stay well once out of the hospital  2)  compare healthy versus unhealthy supports and identify some examples of each  3)  generate ideas and descriptions of healthy supports that can be added  4)  offer mutual support about how to address unhealthy supports  5)  encourage active participation in and adherence to discharge plan    Summary of Patient Progress: Patient present in group but did not participate   Therapeutic Modalities:   Motivational Interviewing Brief Solution-Focused Therapy  Susa Simmonds, LCSWA 11/14/2020  2:56 PM

## 2020-11-14 NOTE — Progress Notes (Signed)
Patient continues to have improvement in mood. Spending most of her time in the dayroom socializing with her peers. Reports being pleased with the improvement she has experienced with ECT and denies any associated side effects. Reminded patient that she is to be NPO Sunday night after midnight for ECT on Monday morning. Denies SI, HI and AVH

## 2020-11-14 NOTE — Progress Notes (Signed)
[facility] MD Progress Note  11/14/2020 12:42 PM Cristina Finley  MRN:  [id number] Subjective: Follow-up patient with a psychotic depression.  Continues to show significant improvement over her prior baseline.  Dressed and groomed well.  Socializing with others.  No longer believes that she is homeless or friendless.  Denies acute suicidal intent.  Slight anxiety but under good control.  No significant side effects to ECT or medicine currently Principal Problem: MDD (major depressive disorder), recurrent, severe, with psychosis (HCC) Diagnosis: Principal Problem:   MDD (major depressive disorder), recurrent, severe, with psychosis (HCC) Active Problems:   Alcohol abuse   Nicotine abuse  Total Time spent with patient: 30 minutes  Past Psychiatric History: Past history of an extended phase of severe psychotic depression  Past Medical History: History reviewed. No pertinent past medical history. History reviewed. No pertinent surgical history. Family History: History reviewed. No pertinent family history. Family Psychiatric  History: See previous Social History:  Social History   Substance and Sexual Activity  Alcohol Use Yes  . Alcohol/week: 6.0 standard drinks  . Types: 6 Cans of beer per week   Comment: one 6 pack daily     Social History   Substance and Sexual Activity  Drug Use Yes  . Types: Marijuana   Comment: last used 2 days ago per pt    Social History   Socioeconomic History  . Marital status: Married    Spouse name: Not on file  . Number of children: Not on file  . Years of education: Not on file  . Highest education level: Not on file  Occupational History  . Not on file  Tobacco Use  . Smoking status: Current Every Day Smoker    Packs/day: 1.50    Types: Cigarettes  . Smokeless tobacco: Never Used  Vaping Use  . Vaping Use: Never used  Substance and Sexual Activity  . Alcohol use: Yes    Alcohol/week: 6.0 standard drinks    Types: 6 Cans of beer per week     Comment: one 6 pack daily  . Drug use: Yes    Types: Marijuana    Comment: last used 2 days ago per pt  . Sexual activity: Yes    Partners: Male    Birth control/protection: Surgical  Other Topics Concern  . Not on file  Social History Narrative  . Not on file   Social Determinants of Health   Financial Resource Strain: Not on file  Food Insecurity: Not on file  Transportation Needs: Not on file  Physical Activity: Not on file  Stress: Not on file  Social Connections: Not on file   Additional Social History:                         Sleep: Fair  Appetite:  Fair  Current Medications: Current Facility-Administered Medications  Medication Dose Route Frequency Provider Last Rate Last Admin  . acetaminophen (TYLENOL) tablet 650 mg  650 mg Oral Q6H PRN Jernee Murtaugh T, MD      . alum & mag hydroxide-simeth (MAALOX/MYLANTA) 200-200-20 MG/5ML suspension 30 mL  30 mL Oral Q4H PRN Evy Lutterman T, MD      . escitalopram (LEXAPRO) tablet 20 mg  20 mg Oral QHS [name], MD   20 mg at 11/13/20 2113  . feeding supplement (ENSURE ENLIVE / ENSURE PLUS) liquid 237 mL  237 mL Oral BID BM [name], MD   237 mL at 11/13/20  1622  . guaiFENesin (ROBITUSSIN) 100 MG/5ML solution 200 mg  200 mg Oral Q4H PRN [name], MD   200 mg at 10/23/20 0800  . hydrOXYzine (ATARAX/VISTARIL) tablet 25 mg  25 mg Oral TID PRN Heily Carlucci, [name], MD   25 mg at 11/13/20 1640  . magnesium hydroxide (MILK OF MAGNESIA) suspension 30 mL  30 mL Oral Daily PRN Dusty Wagoner T, MD      . midazolam (VERSED) injection 2 mg  2 mg Intravenous Once Raford Brissett T, MD      . multivitamin with minerals tablet 1 tablet  1 tablet Oral Daily [name], MD   1 tablet at 11/14/20 0809  . naltrexone (DEPADE) tablet 50 mg  50 mg Oral Daily [name], MD   50 mg at 11/14/20 0809  . nicotine (NICODERM CQ - dosed in mg/24 hours) patch 21 mg  21 mg Transdermal Daily Emanual Lamountain, [name], MD   21 mg at  11/14/20 0809  . OLANZapine zydis (ZYPREXA) disintegrating tablet 20 mg  20 mg Oral QHS [name], MD   20 mg at 11/13/20 2112  . ondansetron (ZOFRAN) injection 4 mg  4 mg Intravenous Once PRN Naomie Dean, MD      . pneumococcal 23 valent vaccine (PNEUMOVAX-23) injection 0.5 mL  0.5 mL Intramuscular Tomorrow-1000 [name], MD      . thiamine tablet 100 mg  100 mg Oral Daily [name], MD   100 mg at 11/14/20 0809  . traZODone (DESYREL) tablet 50 mg  50 mg Oral QHS PRN Lakota Markgraf, [name], MD   50 mg at 11/13/20 2113    Lab Results: No results found for this or any previous visit (from the past 48 hour(s)).  Blood Alcohol level:  Lab Results  Component Value Date   ETH <10 10/18/2020    Metabolic Disorder Labs: Lab Results  Component Value Date   HGBA1C 5.6 10/20/2020   MPG 114.02 10/20/2020   No results found for: PROLACTIN Lab Results  Component Value Date   CHOL 184 10/20/2020   TRIG 143 10/20/2020   HDL 51 10/20/2020   CHOLHDL 3.6 10/20/2020   VLDL 29 10/20/2020   LDLCALC 104 (H) 10/20/2020    Physical Findings: AIMS: Facial and Oral Movements Muscles of Facial Expression: None, normal Lips and Perioral Area: None, normal Jaw: None, normal Tongue: None, normal,Extremity Movements Upper (arms, wrists, hands, fingers): None, normal Lower (legs, knees, ankles, toes): None, normal, Trunk Movements Neck, shoulders, hips: None, normal, Overall Severity Severity of abnormal movements (highest score from questions above): None, normal Incapacitation due to abnormal movements: None, normal Patient's awareness of abnormal movements (rate only patient's report): No Awareness, Dental Status Current problems with teeth and/or dentures?: No Does patient usually wear dentures?: No  CIWA:  CIWA-Ar Total: 4 COWS:  COWS Total Score: 1  Musculoskeletal: Strength & Muscle Tone: within normal limits Gait & Station: normal Patient leans: N/A  Psychiatric  Specialty Exam:  Presentation  General Appearance: Fairly Groomed  Eye Contact:Fair  Speech:Slow  Speech Volume:Decreased  Handedness:Right   Mood and Affect  Mood:Depressed  Affect:Congruent   Thought Process  Thought Processes:Coherent; Goal Directed  Descriptions of Associations:Intact  Orientation:Full (Time, Place and Person)  Thought Content:Paranoid Ideation  History of Schizophrenia/Schizoaffective disorder:No  Duration of Psychotic Symptoms:Less than six months  Hallucinations:No data recorded Ideas of Reference:Paranoia  Suicidal Thoughts:No data recorded Homicidal Thoughts:No data recorded  Sensorium  Memory:Immediate Fair;  Recent Fair; Remote Fair  Judgment:Intact  Insight:Shallow   Executive Functions  Concentration:Fair  Attention Span:Poor  Recall:Poor  Fund of Knowledge:Fair  Language:Fair   Psychomotor Activity  Psychomotor Activity:No data recorded  Assets  Assets:Desire for Improvement; Financial Resources/Insurance; Housing; Intimacy; Physical Health; Social Support   Sleep  Sleep:No data recorded   Physical Exam: Physical Exam Vitals and nursing note reviewed.  Constitutional:      Appearance: Normal appearance.  HENT:     Head: Normocephalic and atraumatic.     Mouth/Throat:     Pharynx: Oropharynx is clear.  Eyes:     Pupils: Pupils are equal, round, and reactive to light.  Cardiovascular:     Rate and Rhythm: Normal rate and regular rhythm.  Pulmonary:     Effort: Pulmonary effort is normal.     Breath sounds: Normal breath sounds.  Abdominal:     General: Abdomen is flat.     Palpations: Abdomen is soft.  Musculoskeletal:        General: Normal range of motion.  Skin:    General: Skin is warm and dry.  Neurological:     General: No focal deficit present.     Mental Status: She is alert. Mental status is at baseline.  Psychiatric:        Mood and Affect: Mood normal.        Thought Content:  Thought content normal.    Review of Systems  Constitutional: Negative.   HENT: Negative.   Eyes: Negative.   Respiratory: Negative.   Cardiovascular: Negative.   Gastrointestinal: Negative.   Musculoskeletal: Negative.   Skin: Negative.   Neurological: Negative.   Psychiatric/Behavioral: Negative.    Blood pressure 105/88, pulse 65, temperature 97.9 F (36.6 C), temperature source Oral, resp. rate 18, height 5\' 4"  (1.626 m), weight 47.6 kg, SpO2 100 %. Body mass index is 18.02 kg/m.   Treatment Plan Summary: Medication management and Plan Partial but significant improvement.  Tolerating ECT well.  We reviewed that she continues to feel comfortable with a discharge plan tomorrow consisting of outpatient ECT.  N.p.o. orders will be placed for tonight for ECT tomorrow.  We will make sure prescriptions are available.  , MD 11/14/2020, 12:42 PM

## 2020-11-14 NOTE — Plan of Care (Signed)
  Problem: Education: Goal: Ability to state activities that reduce stress will improve Outcome: Progressing   Problem: Coping: Goal: Ability to identify and develop effective coping behavior will improve Outcome: Progressing   Problem: Self-Concept: Goal: Ability to identify factors that promote anxiety will improve Outcome: Progressing Goal: Level of anxiety will decrease Outcome: Progressing Goal: Ability to modify response to factors that promote anxiety will improve Outcome: Progressing   Problem: Education: Goal: Utilization of techniques to improve thought processes will improve Outcome: Progressing Goal: Knowledge of the prescribed therapeutic regimen will improve Outcome: Progressing   Problem: Activity: Goal: Interest or engagement in leisure activities will improve Outcome: Progressing Goal: Imbalance in normal sleep/wake cycle will improve Outcome: Progressing   Problem: Coping: Goal: Coping ability will improve Outcome: Progressing Goal: Will verbalize feelings Outcome: Progressing   Problem: Health Behavior/Discharge Planning: Goal: Ability to make decisions will improve Outcome: Progressing Goal: Compliance with therapeutic regimen will improve Outcome: Progressing   Problem: Role Relationship: Goal: Will demonstrate positive changes in social behaviors and relationships Outcome: Progressing   Problem: Safety: Goal: Ability to disclose and discuss suicidal ideas will improve Outcome: Progressing Goal: Ability to identify and utilize support systems that promote safety will improve Outcome: Progressing   Problem: Self-Concept: Goal: Will verbalize positive feelings about self Outcome: Progressing Goal: Level of anxiety will decrease Outcome: Progressing   

## 2020-11-14 NOTE — Tx Team (Cosign Needed)
Interdisciplinary Treatment and Diagnostic Plan Update  11/14/2020 Time of Session: 11:30 AM  Keliah Harned MRN: [id number]  Principal Diagnosis: MDD (major depressive disorder), recurrent, severe, with psychosis (HCC)  Secondary Diagnoses: Principal Problem:   MDD (major depressive disorder), recurrent, severe, with psychosis (HCC) Active Problems:   Alcohol abuse   Nicotine abuse   Current Medications:  Current Facility-Administered Medications  Medication Dose Route Frequency Provider Last Rate Last Admin  . acetaminophen (TYLENOL) tablet 650 mg  650 mg Oral Q6H PRN [name], [name], MD      . alum & mag hydroxide-simeth (MAALOX/MYLANTA) 200-200-20 MG/5ML suspension 30 mL  30 mL Oral Q4H PRN [name], [name], MD      . escitalopram (LEXAPRO) tablet 20 mg  20 mg Oral QHS [name], MD   20 mg at 11/13/20 2113  . feeding supplement (ENSURE ENLIVE / ENSURE PLUS) liquid 237 mL  237 mL Oral BID BM [name], MD   237 mL at 11/13/20 1622  . guaiFENesin (ROBITUSSIN) 100 MG/5ML solution 200 mg  200 mg Oral Q4H PRN [name], MD   200 mg at 10/23/20 0800  . hydrOXYzine (ATARAX/VISTARIL) tablet 25 mg  25 mg Oral TID PRN [name], [name], MD   25 mg at 11/13/20 1640  . magnesium hydroxide (MILK OF MAGNESIA) suspension 30 mL  30 mL Oral Daily PRN [name], [name], MD      . midazolam (VERSED) injection 2 mg  2 mg Intravenous Once [name], [name], MD      . multivitamin with minerals tablet 1 tablet  1 tablet Oral Daily [name], MD   1 tablet at 11/14/20 0809  . naltrexone (DEPADE) tablet 50 mg  50 mg Oral Daily [name], MD   50 mg at 11/14/20 0809  . nicotine (NICODERM CQ - dosed in mg/24 hours) patch 21 mg  21 mg Transdermal Daily [name], [name], MD   21 mg at 11/14/20 0809  . OLANZapine zydis (ZYPREXA) disintegrating tablet 20 mg  20 mg Oral QHS [name], MD   20 mg at 11/13/20 2112  . ondansetron (ZOFRAN) injection 4 mg  4 mg Intravenous Once PRN  Naomie Dean, MD      . pneumococcal 23 valent vaccine (PNEUMOVAX-23) injection 0.5 mL  0.5 mL Intramuscular Tomorrow-1000 [name], MD      . thiamine tablet 100 mg  100 mg Oral Daily [name], MD   100 mg at 11/14/20 0809  . traZODone (DESYREL) tablet 50 mg  50 mg Oral QHS PRN [name], [name], MD   50 mg at 11/13/20 2113   PTA Medications: Medications Prior to Admission  Medication Sig Dispense Refill Last Dose  . citalopram (CELEXA) 20 MG tablet Take 20 mg by mouth daily.   11/09/2020 at Unknown time    Patient Stressors: Marital or family conflict Substance abuse  Patient Strengths: Barrister's clerk for treatment/growth  Treatment Modalities: Medication Management, Group therapy, Case management,  1 to 1 session with clinician, Psychoeducation, Recreational therapy.   Physician Treatment Plan for Primary Diagnosis: MDD (major depressive disorder), recurrent, severe, with psychosis (HCC) Long Term Goal(s): Improvement in symptoms so as ready for discharge Improvement in symptoms so as ready for discharge   Short Term Goals: Ability to identify changes in lifestyle to reduce recurrence of condition will improve Ability to verbalize feelings will improve Ability to disclose and discuss suicidal ideas Ability to  demonstrate self-control will improve Ability to identify and develop effective coping behaviors will improve Ability to maintain clinical measurements within normal limits will improve Compliance with prescribed medications will improve Ability to identify triggers associated with substance abuse/mental health issues will improve Ability to identify changes in lifestyle to reduce recurrence of condition will improve Ability to verbalize feelings will improve Ability to disclose and discuss suicidal ideas Ability to demonstrate self-control will improve Ability to identify and develop effective coping behaviors will improve Ability to  maintain clinical measurements within normal limits will improve Compliance with prescribed medications will improve Ability to identify triggers associated with substance abuse/mental health issues will improve  Medication Management: Evaluate patient's response, side effects, and tolerance of medication regimen.  Therapeutic Interventions: 1 to 1 sessions, Unit Group sessions and Medication administration.  Evaluation of Outcomes: Progressing  Physician Treatment Plan for Secondary Diagnosis: Principal Problem:   MDD (major depressive disorder), recurrent, severe, with psychosis (HCC) Active Problems:   Alcohol abuse   Nicotine abuse  Long Term Goal(s): Improvement in symptoms so as ready for discharge Improvement in symptoms so as ready for discharge   Short Term Goals: Ability to identify changes in lifestyle to reduce recurrence of condition will improve Ability to verbalize feelings will improve Ability to disclose and discuss suicidal ideas Ability to demonstrate self-control will improve Ability to identify and develop effective coping behaviors will improve Ability to maintain clinical measurements within normal limits will improve Compliance with prescribed medications will improve Ability to identify triggers associated with substance abuse/mental health issues will improve Ability to identify changes in lifestyle to reduce recurrence of condition will improve Ability to verbalize feelings will improve Ability to disclose and discuss suicidal ideas Ability to demonstrate self-control will improve Ability to identify and develop effective coping behaviors will improve Ability to maintain clinical measurements within normal limits will improve Compliance with prescribed medications will improve Ability to identify triggers associated with substance abuse/mental health issues will improve     Medication Management: Evaluate patient's response, side effects, and tolerance  of medication regimen.  Therapeutic Interventions: 1 to 1 sessions, Unit Group sessions and Medication administration.  Evaluation of Outcomes: Progressing   RN Treatment Plan for Primary Diagnosis: MDD (major depressive disorder), recurrent, severe, with psychosis (HCC) Long Term Goal(s): Knowledge of disease and therapeutic regimen to maintain health will improve  Short Term Goals: Ability to remain free from injury will improve, Ability to demonstrate self-control, Ability to participate in decision making will improve, Ability to verbalize feelings will improve, Ability to disclose and discuss suicidal ideas, Ability to identify and develop effective coping behaviors will improve and Compliance with prescribed medications will improve  Medication Management: RN will administer medications as ordered by provider, will assess and evaluate patient's response and provide education to patient for prescribed medication. RN will report any adverse and/or side effects to prescribing provider.  Therapeutic Interventions: 1 on 1 counseling sessions, Psychoeducation, Medication administration, Evaluate responses to treatment, Monitor vital signs and CBGs as ordered, Perform/monitor CIWA, COWS, AIMS and Fall Risk screenings as ordered, Perform wound care treatments as ordered.  Evaluation of Outcomes: Progressing   LCSW Treatment Plan for Primary Diagnosis: MDD (major depressive disorder), recurrent, severe, with psychosis (HCC) Long Term Goal(s): Safe transition to appropriate next level of care at discharge, Engage patient in therapeutic group addressing interpersonal concerns.  Short Term Goals: Engage patient in aftercare planning with referrals and resources, Increase social support, Increase ability to appropriately verbalize feelings, Increase emotional  regulation, Facilitate acceptance of mental health diagnosis and concerns, Facilitate patient progression through stages of change regarding  substance use diagnoses and concerns, Identify triggers associated with mental health/substance abuse issues and Increase skills for wellness and recovery  Therapeutic Interventions: Assess for all discharge needs, 1 to 1 time with Social worker, Explore available resources and support systems, Assess for adequacy in community support network, Educate family and significant other(s) on suicide prevention, Complete Psychosocial Assessment, Interpersonal group therapy.  Evaluation of Outcomes: Progressing   Progress in Treatment: Attending groups: No. Participating in groups: No. Taking medication as prescribed: Yes. Toleration medication: Yes. Family/Significant other contact made: No, will contact:  Patient declined  Patient understands diagnosis: Yes. Discussing patient identified problems/goals with staff: Yes. Medical problems stabilized or resolved: Yes. Denies suicidal/homicidal ideation: Yes. Issues/concerns per patient self-inventory: No. Other: None   New problem(s) identified: No, Describe:  None  New Short Term/Long Term Goal(s): detox, elimination of symptoms of psychosis, medication management for mood stabilization; elimination of SI thoughts; development of comprehensive mental wellness/sobriety plan. Update 10/25/20: No changes at this time. Update 10/30/20: Patient was declining and not ingesting some of her medications. Update 11/04/20: No changes at this time. Update 11/09/20: No changes at this time. Update 11/14/20: No changes at this time.    Patient Goals:  "I just want to get these thoughts out of my head." Update 10/25/20: No changes at this time. Update 10/30/20: No changes at this time. Update 11/04/20: No changes at this time. Update 11/09/20: No changes at this time.  Update 11/14/20: No changes at this time.     Discharge Plan or Barriers: CSW will assist pt with development of an appropriate aftercare//discharge plan. Pt to be given information regarding  residential/inpt substance use treatment. Update 10/25/20: No changes at this time. Update 10/30/20: No changes at this time. Update 11/04/20: No changes at this time. Pt psychiatric symptoms do not appear to be getting better. Update 11/09/20: No changes at this time. Update 11/14/20: The plan is for patient to discharge 11/15/20 after ECT.    Reason for Continuation of Hospitalization: Depression Hallucinations Medication stabilization Withdrawal symptoms  Estimated Length of Stay: 1-2 days   Attendees: Patient: 11/14/2020 11:48 AM  Physician: Dr. Mordecai Rasmussen, MD 11/14/2020 11:48 AM  Nursing:  11/14/2020 11:48 AM  RN Care Manager: 11/14/2020 11:48 AM  Social Worker: Susa Simmonds, LCSWA 11/14/2020 11:48 AM  Recreational Therapist:  11/14/2020 11:48 AM  Other:  11/14/2020 11:48 AM  Other:  11/14/2020 11:48 AM  Other: 11/14/2020 11:48 AM    Scribe for Treatment Team: Susa Simmonds, LCSWA 11/14/2020 11:48 AM

## 2020-11-14 NOTE — Plan of Care (Signed)
Patient stated that her goal is making plans to go home and do what is asked. Patient rated her depression and anxiety 7/10. Patient pleasant and cooperative on approach. Visible in the milieu. Denies SI,HI and AVH.Vistaril given x 1 for anxiety. Appetite and energy level good. Patient aware of NPO after mid night for ECT. Support and encouragement given.

## 2020-11-15 ENCOUNTER — Inpatient Hospital Stay: Payer: BC Managed Care – PPO | Admitting: Anesthesiology

## 2020-11-15 ENCOUNTER — Ambulatory Visit: Payer: BC Managed Care – PPO | Attending: Psychiatry

## 2020-11-15 ENCOUNTER — Encounter: Payer: Self-pay | Admitting: Psychiatry

## 2020-11-15 ENCOUNTER — Other Ambulatory Visit: Payer: Self-pay | Admitting: Psychiatry

## 2020-11-15 DIAGNOSIS — Z01812 Encounter for preprocedural laboratory examination: Secondary | ICD-10-CM | POA: Diagnosis not present

## 2020-11-15 LAB — GLUCOSE, CAPILLARY: Glucose-Capillary: 84 mg/dL (ref 70–99)

## 2020-11-15 MED ORDER — SUCCINYLCHOLINE CHLORIDE 20 MG/ML IJ SOLN
INTRAMUSCULAR | Status: DC | PRN
  Administered 2020-11-15: 70 mg via INTRAVENOUS

## 2020-11-15 MED ORDER — METHOHEXITAL SODIUM 0.5 G IJ SOLR
INTRAMUSCULAR | Status: AC
  Filled 2020-11-15: qty 500

## 2020-11-15 MED ORDER — METHOHEXITAL SODIUM 100 MG/10ML IV SOSY
PREFILLED_SYRINGE | INTRAVENOUS | Status: DC | PRN
  Administered 2020-11-15: 70 mg via INTRAVENOUS

## 2020-11-15 MED ORDER — SODIUM CHLORIDE 0.9 % IV SOLN: INTRAVENOUS | Status: DC | PRN

## 2020-11-15 MED ORDER — GLYCOPYRROLATE 0.2 MG/ML IJ SOLN: 0.1000 mg | Freq: Once | INTRAMUSCULAR | Status: DC

## 2020-11-15 MED ORDER — SODIUM CHLORIDE 0.9 % IV SOLN
500.0000 mL | Freq: Once | INTRAVENOUS | Status: AC
  Administered 2020-11-15: 500 mL via INTRAVENOUS

## 2020-11-15 NOTE — [facility] Group Notes (Signed)
LCSW Group Therapy Note   11/15/2020 2:34 PM  Type of Therapy and Topic:  Group Therapy:  Overcoming Obstacles   Participation Level:  Did Not Attend   Description of Group:    In this group patients will be encouraged to explore what they see as obstacles to their own wellness and recovery. They will be guided to discuss their thoughts, feelings, and behaviors related to these obstacles. The group will process together ways to cope with barriers, with attention given to specific choices patients can make. Each patient will be challenged to identify changes they are motivated to make in order to overcome their obstacles. This group will be process-oriented, with patients participating in exploration of their own experiences as well as giving and receiving support and challenge from other group members.   Therapeutic Goals: 1. Patient will identify personal and current obstacles as they relate to admission. 2. Patient will identify barriers that currently interfere with their wellness or overcoming obstacles.  3. Patient will identify feelings, thought process and behaviors related to these barriers. 4. Patient will identify two changes they are willing to make to overcome these obstacles:      Summary of Patient Progress X    Therapeutic Modalities:   Cognitive Behavioral Therapy Solution Focused Therapy Motivational Interviewing Relapse Prevention Therapy  Cristina Finley R. Algis Greenhouse, MSW, LCSW, LCAS 11/15/2020 2:34 PM

## 2020-11-15 NOTE — Progress Notes (Signed)
Recreation Therapy Notes  Date: 11/15/2020  Time: 10:00 am   Location: Craft room    Behavioral response: N/A   Intervention Topic: Stress Management    Discussion/Intervention: Patient did not attend group.   Clinical Observations/Feedback:  Patient did not attend group.   [name] LRT/CTRS        Cristina Finley 11/15/2020 11:23 AM 

## 2020-11-15 NOTE — [facility] Suicide Risk Assessment (Signed)
[facility] Discharge Suicide Risk Assessment   Principal Problem: MDD (major depressive disorder), recurrent, severe, with psychosis (HCC) Discharge Diagnoses: Principal Problem:   MDD (major depressive disorder), recurrent, severe, with psychosis (HCC) Active Problems:   Alcohol abuse   Nicotine abuse   Total Time spent with patient: 30 minutes  Musculoskeletal: Strength & Muscle Tone: within normal limits Gait & Station: normal Patient leans: N/A  Psychiatric Specialty Exam  Presentation  General Appearance: Appropriate for Environment  Eye Contact:Good  Speech:Normal Rate  Speech Volume:Normal  Handedness:Right   Mood and Affect  Mood:Euthymic  Duration of Depression Symptoms: Greater than two weeks  Affect:Congruent   Thought Process  Thought Processes:Goal Directed; Coherent  Descriptions of Associations:Intact  Orientation:Full (Time, Place and Person)  Thought Content:Logical  History of Schizophrenia/Schizoaffective disorder:No  Duration of Psychotic Symptoms:Less than six months  Hallucinations:Hallucinations: None  Ideas of Reference:None  Suicidal Thoughts:Suicidal Thoughts: No  Homicidal Thoughts:Homicidal Thoughts: No   Sensorium  Memory:Immediate Fair; Recent Fair; Remote Fair  Judgment:Intact  Insight:Present   Executive Functions  Concentration:Fair  Attention Span:Fair  Recall:Fair  Fund of Knowledge:Fair  Language:Fair   Psychomotor Activity  Psychomotor Activity:Psychomotor Activity: Normal   Assets  Assets:Communication Skills; Desire for Improvement; Financial Resources/Insurance; Housing; Intimacy; Leisure Time; Physical Health; Resilience; Social Support; Talents/Skills   Sleep  Sleep:Sleep: Good Number of Hours of Sleep: 7.75   Physical Exam: Physical Exam Vitals and nursing note reviewed.  Constitutional:      Appearance: Normal appearance.  HENT:     Head: Normocephalic and atraumatic.      Mouth/Throat:     Pharynx: Oropharynx is clear.  Eyes:     Pupils: Pupils are equal, round, and reactive to light.  Cardiovascular:     Rate and Rhythm: Normal rate and regular rhythm.  Pulmonary:     Effort: Pulmonary effort is normal.     Breath sounds: Normal breath sounds.  Abdominal:     General: Abdomen is flat.     Palpations: Abdomen is soft.  Musculoskeletal:        General: Normal range of motion.  Skin:    General: Skin is warm and dry.  Neurological:     General: No focal deficit present.     Mental Status: She is alert. Mental status is at baseline.  Psychiatric:        Mood and Affect: Mood normal.        Thought Content: Thought content normal.    Review of Systems  Constitutional: Negative.   HENT: Negative.   Eyes: Negative.   Respiratory: Negative.   Cardiovascular: Negative.   Gastrointestinal: Negative.   Musculoskeletal: Negative.   Skin: Negative.   Neurological: Negative.   Psychiatric/Behavioral: Negative.    Blood pressure 109/65, pulse 74, temperature 98.2 F (36.8 C), resp. rate 14, height 5\' 4"  (1.626 m), weight 47.6 kg, SpO2 97 %. Body mass index is 18.02 kg/m.  Mental Status Per Nursing Assessment::   On Admission:  Suicidal ideation indicated by patient,Self-harm thoughts  Demographic Factors:  Caucasian  Loss Factors: NA  Historical Factors: NA  Risk Reduction Factors:   Sense of responsibility to family, Living with another person, especially a relative, Positive social support and Positive therapeutic relationship  Continued Clinical Symptoms:  Depression:   Severe  Cognitive Features That Contribute To Risk:  None    Suicide Risk:  Minimal: No identifiable suicidal ideation.  Patients presenting with no risk factors but with morbid ruminations; may be classified as minimal  risk based on the severity of the depressive symptoms    Plan Of Care/Follow-up recommendations:  Activity:   Activity as tolerated Diet:   Regular diet Other:  Follow-up with ECT and outpatient psychiatric follow-up  Mordecai Rasmussen, MD 11/15/2020, 2:35 PM

## 2020-11-15 NOTE — Plan of Care (Signed)
  Problem: Education: Goal: Ability to state activities that reduce stress will improve Outcome: Progressing   Problem: Coping: Goal: Ability to identify and develop effective coping behavior will improve Outcome: Progressing   Problem: Self-Concept: Goal: Ability to identify factors that promote anxiety will improve Outcome: Progressing Goal: Level of anxiety will decrease Outcome: Progressing Goal: Ability to modify response to factors that promote anxiety will improve Outcome: Progressing   Problem: Education: Goal: Utilization of techniques to improve thought processes will improve Outcome: Progressing Goal: Knowledge of the prescribed therapeutic regimen will improve Outcome: Progressing   Problem: Activity: Goal: Interest or engagement in leisure activities will improve Outcome: Progressing Goal: Imbalance in normal sleep/wake cycle will improve Outcome: Progressing   Problem: Coping: Goal: Coping ability will improve Outcome: Progressing Goal: Will verbalize feelings Outcome: Progressing   Problem: Health Behavior/Discharge Planning: Goal: Ability to make decisions will improve Outcome: Progressing Goal: Compliance with therapeutic regimen will improve Outcome: Progressing   Problem: Role Relationship: Goal: Will demonstrate positive changes in social behaviors and relationships Outcome: Progressing   Problem: Safety: Goal: Ability to disclose and discuss suicidal ideas will improve Outcome: Progressing Goal: Ability to identify and utilize support systems that promote safety will improve Outcome: Progressing   Problem: Self-Concept: Goal: Will verbalize positive feelings about self Outcome: Progressing Goal: Level of anxiety will decrease Outcome: Progressing   

## 2020-11-15 NOTE — Anesthesia Preprocedure Evaluation (Signed)
Anesthesia Evaluation  Patient identified by MRN, date of birth, ID band Patient awake    Reviewed: Allergy & Precautions, NPO status , Patient's Chart, lab work & pertinent test results  History of Anesthesia Complications Negative for: history of anesthetic complications  Airway Mallampati: III  TM Distance: >3 FB Neck ROM: Full    Dental  (+) Chipped   Pulmonary neg sleep apnea, neg COPD, neg recent URI, Current Smoker and Patient abstained from smoking.,    breath sounds clear to auscultation- rhonchi (-) wheezing      Cardiovascular Exercise Tolerance: Good (-) hypertension(-) CAD, (-) Past MI, (-) Cardiac Stents and (-) CABG  Rhythm:Regular Rate:Normal - Systolic murmurs and - Diastolic murmurs    Neuro/Psych neg Seizures PSYCHIATRIC DISORDERS Anxiety Depression negative neurological ROS     GI/Hepatic negative GI ROS, Neg liver ROS,   Endo/Other  negative endocrine ROSneg diabetes  Renal/GU negative Renal ROS     Musculoskeletal negative musculoskeletal ROS (+)   Abdominal (+) - obese,   Peds  Hematology negative hematology ROS (+)   Anesthesia Other Findings   Reproductive/Obstetrics                             Anesthesia Physical  Anesthesia Plan  ASA: II  Anesthesia Plan: General   Post-op Pain Management:    Induction: Intravenous  PONV Risk Score and Plan: 1 and Ondansetron  Airway Management Planned: Mask  Additional Equipment:   Intra-op Plan:   Post-operative Plan:   Informed Consent: I have reviewed the patients History and Physical, chart, labs and discussed the procedure including the risks, benefits and alternatives for the proposed anesthesia with the patient or authorized representative who has indicated his/her understanding and acceptance.     Dental advisory given  Plan Discussed with: CRNA and Anesthesiologist  Anesthesia Plan Comments:  (Patient consented for risks of anesthesia including but not limited to:  - adverse reactions to medications - risk of airway placement if required - damage to eyes, teeth, lips or other oral mucosa - nerve damage due to positioning  - sore throat or hoarseness - Damage to heart, brain, nerves, lungs, other parts of body or loss of life  Patient voiced understanding.)        Anesthesia Quick Evaluation

## 2020-11-15 NOTE — Anesthesia Postprocedure Evaluation (Signed)
Anesthesia Post Note  Patient: Cristina Finley  Procedure(s) Performed: ECT TX  Patient location during evaluation: PACU Anesthesia Type: General Level of consciousness: awake and alert Pain management: pain level controlled Vital Signs Assessment: post-procedure vital signs reviewed and stable Respiratory status: spontaneous breathing, nonlabored ventilation, respiratory function stable and patient connected to nasal cannula oxygen Cardiovascular status: blood pressure returned to baseline and stable Postop Assessment: no apparent nausea or vomiting Anesthetic complications: no   No complications documented.   Last Vitals:  Vitals:   11/15/20 1330 11/15/20 1340  BP: 117/67 109/65  Pulse: 73 74  Resp: 16 14  Temp:  36.8 C  SpO2: 98% 97%    Last Pain:  Vitals:   11/15/20 1340  TempSrc:   PainSc: 0-No pain                 [name] Sherilynn Dieu

## 2020-11-15 NOTE — Transfer of Care (Signed)
Immediate Anesthesia Transfer of Care Note  Patient: Cristina Finley  Procedure(s) Performed: ECT TX  Patient Location: PACU  Anesthesia Type:General  Level of Consciousness: awake, drowsy and patient cooperative  Airway & Oxygen Therapy: Patient Spontanous Breathing and Patient connected to face mask oxygen  Post-op Assessment: Report given to RN and Post -op Vital signs reviewed and stable  Post vital signs: Reviewed and stable  Last Vitals:  Vitals Value Taken Time  BP 132/59 11/15/20 1311  Temp    Pulse 82 11/15/20 1313  Resp    SpO2 100 % 11/15/20 1313  Vitals shown include unvalidated device data.  Last Pain:  Vitals:   11/15/20 1050  TempSrc:   PainSc: 0-No pain         Complications: No complications documented.

## 2020-11-15 NOTE — Procedures (Signed)
ECT SERVICES Physician's Interval Evaluation & Treatment Note  Patient Identification: Cristina Finley MRN:  [id number] Date of Evaluation:  11/15/2020 TX #: 3  MADRS:   MMSE:   P.E. Findings:  No change to physical  Psychiatric Interval Note:  Continues to show improved mood and thought quality and improved activity  Subjective:  Patient is a 45 y.o. female seen for evaluation for Electroconvulsive Therapy. Patient acknowledges feeling better  Treatment Summary:   [x]   Right Unilateral             []  Bilateral   % Energy : 0.3 ms 50%   Impedance: 1850 ohms  Seizure Energy Index: 3977 V squared  Postictal Suppression Index: No reading  Seizure Concordance Index: 89%  Medications  Pre Shock: Robinul 0.1 mg Brevital 70 mg succinylcholine 70 mg  Post Shock: Versed 2 mg  Seizure Duration: 43 seconds EMG 86 seconds EEG   Comments: Patient is being discharged home and will follow up as an outpatient to complete index course  Lungs:  [x]   Clear to auscultation               []  Other:   Heart:    [x]   Regular rhythm             []  irregular rhythm    [x]   Previous H&P reviewed, patient examined and there are NO CHANGES                 []   Previous H&P reviewed, patient examined and there are changes noted.   , MD 6/6/20225:13 PM

## 2020-11-15 NOTE — Progress Notes (Signed)
Patient was reminded to remain NPO after midnight for ECT. She says that her mom is very pleased at the change in her since she has had ECT. She says she seems like her old self. Affect is bright. Mood is pleasant. Denies SI, HI and AVH

## 2020-11-15 NOTE — Progress Notes (Signed)
Patient denies SI/HI, denies A/V hallucinations. Patient verbalizes understanding of discharge instructions, follow up care and prescriptions. Patient given all belongings from [facility] locker. Patient escorted out by staff, transported by family. 

## 2020-11-15 NOTE — H&P (Signed)
Cristina Finley is an 45 y.o. female.   Chief Complaint: Patient reports mood is significantly improved. HPI: Recent severe depression with hospitalization  History reviewed. No pertinent past medical history.  History reviewed. No pertinent surgical history.  History reviewed. No pertinent family history. Social History:  reports that she has been smoking cigarettes. She has been smoking about 1.50 packs per day. She has never used smokeless tobacco. She reports current alcohol use of about 6.0 standard drinks of alcohol per week. She reports current drug use. Drug: Marijuana.  Allergies:  Allergies  Allergen Reactions  . Penicillins Hives and Nausea Only    Medications Prior to Admission  Medication Sig Dispense Refill  . citalopram (CELEXA) 20 MG tablet Take 20 mg by mouth daily.      Results for orders placed or performed during the hospital encounter of 10/19/20 (from the past 48 hour(s))  Glucose, capillary     Status: None   Collection Time: 11/15/20  6:52 AM  Result Value Ref Range   Glucose-Capillary 84 70 - 99 mg/dL    Comment: Glucose reference range applies only to samples taken after fasting for at least 8 hours.   No results found.  Review of Systems  Constitutional: Negative.   HENT: Negative.   Eyes: Negative.   Respiratory: Negative.   Cardiovascular: Negative.   Gastrointestinal: Negative.   Musculoskeletal: Negative.   Skin: Negative.   Neurological: Negative.   Psychiatric/Behavioral: Negative.     Blood pressure 107/70, pulse 61, temperature 98.3 F (36.8 C), temperature source Oral, resp. rate 17, height 5\' 4"  (1.626 m), weight 47.6 kg, SpO2 98 %. Physical Exam Vitals and nursing note reviewed.  Constitutional:      Appearance: She is well-developed.  HENT:     Head: Normocephalic and atraumatic.  Eyes:     Conjunctiva/sclera: Conjunctivae normal.     Pupils: Pupils are equal, round, and reactive to light.  Cardiovascular:     Heart sounds:  Normal heart sounds.  Pulmonary:     Effort: Pulmonary effort is normal.  Abdominal:     Palpations: Abdomen is soft.  Musculoskeletal:        General: Normal range of motion.     Cervical back: Normal range of motion.  Skin:    General: Skin is warm and dry.  Neurological:     General: No focal deficit present.     Mental Status: She is alert.  Psychiatric:        Mood and Affect: Mood normal.        Thought Content: Thought content normal.        Judgment: Judgment normal.      Assessment/Plan Doing very well at this point and no longer requiring inpatient treatment.  Plan is for discharge this afternoon with completion of course as an outpatient  , MD 11/15/2020, 12:18 PM

## 2020-11-15 NOTE — Progress Notes (Signed)
  [facility] Adult Case Management Discharge Plan :  Will you be returning to the same living situation after discharge:  Yes,  pt reports that she is returning home.  At discharge, do you have transportation home?: Yes,  pt reports that her husband will provide transportation.  Do you have the ability to pay for your medications: Yes,  [employer]  Release of information consent forms completed and in the chart;  Patient's signature needed at discharge.  Patient to Follow up at:  Follow-up Information    [facility] Go on 11/17/2020.   Why: Your appointment is scheduled for 11/17/2020 at 2:30PM.  Please bring your insurance information.  Thanks! Contact information: [address] [place] [address] [phone]               Next level of care provider has access to [facility] Link:no  Safety Planning and Suicide Prevention discussed: Yes,  SPE completed with the patient.  Have you used any form of tobacco in the last 30 days? (Cigarettes, Smokeless Tobacco, Cigars, and/or Pipes): Yes  Has patient been referred to the Quitline?: Patient refused referral  Patient has been referred for addiction treatment: Pt. refused referral  Harden Mo, LCSW 11/15/2020, 2:47 PM

## 2020-11-16 ENCOUNTER — Other Ambulatory Visit: Payer: Self-pay | Admitting: Psychiatry

## 2020-11-16 NOTE — Progress Notes (Signed)
Recreation Therapy Notes  INPATIENT RECREATION TR PLAN  Patient Details Name: Cristina Finley MRN: [id number] DOB: 21-Jul-1975 Today's Date: 11/16/2020  Rec Therapy Plan Is patient appropriate for Therapeutic Recreation?: Yes Treatment times per week: at least 3 Estimated Length of Stay: 5-7 days TR Treatment/Interventions: Group participation (Comment)  Discharge Criteria Pt will be discharged from therapy if:: Discharged Treatment plan/goals/alternatives discussed and agreed upon by:: Patient/family  Discharge Summary Short term goals set: Patient will identify 3 positive coping skills to decrease depressive symptoms within 5 recreation therapy group sessions Short term goals met: Complete Progress toward goals comments: Groups attended Which groups?: Communication,Other (Comment) (Happiness, Relaxation) Reason goals not met: N/A Therapeutic equipment acquired: N/A Reason patient discharged from therapy: Discharge from hospital Pt/family agrees with progress & goals achieved: Yes Date patient discharged from therapy: 11/15/20   Lanell Carpenter 11/16/2020, 1:14 PM

## 2020-11-16 NOTE — Discharge Summary (Signed)
Physician Discharge Summary Note  Patient:  Cristina Finley is an 45 y.o., female MRN:  [id number] DOB:  1975-11-26 Patient phone:  [phone] (home)  Patient address:   [address] [place] [address],  Total Time spent with patient: 35 minutes- 25 minutes face-to-face contact with patient, 10 minutes documentation, coordination of care, scripts   Date of Admission:  10/19/2020 Date of Discharge: 11/15/2020  Reason for Admission:  Worsening depression and suicidal ideations  Principal Problem: MDD (major depressive disorder), recurrent, severe, with psychosis (HCC) Discharge Diagnoses: Principal Problem:   MDD (major depressive disorder), recurrent, severe, with psychosis (HCC) Active Problems:   Alcohol abuse   Nicotine abuse   Past Psychiatric History: Patient has been prescribed antidepressant medication in the past. Most recently was on citalopram. Recalls Prozac and Lexapro from the past as well. Denies previous hospitalizations. As mentioned above, reports a suicide attempt this past week and says that she thinks she has done it 1 other time in her life. Does not describe any history of mania. No violence to others. Alcohol abuse seems to have been a chronic issue that is gotten worse recently. Does not seem to have ever been involved in any substance abuse treatment. Past Medical History: History reviewed. No pertinent past medical history. History reviewed. No pertinent surgical history. Family History: History reviewed. No pertinent family history. Family Psychiatric  History: Denies any significant family history except for 1 aunt who has schizophrenia Social History:  Social History   Substance and Sexual Activity  Alcohol Use Yes  . Alcohol/week: 6.0 standard drinks  . Types: 6 Cans of beer per week   Comment: one 6 pack daily     Social History   Substance and Sexual Activity  Drug Use Yes  . Types: Marijuana   Comment: last used 2 days ago  per pt    Social History   Socioeconomic History  . Marital status: Married    Spouse name: Not on file  . Number of children: Not on file  . Years of education: Not on file  . Highest education level: Not on file  Occupational History  . Not on file  Tobacco Use  . Smoking status: Current Every Day Smoker    Packs/day: 1.50    Types: Cigarettes  . Smokeless tobacco: Never Used  Vaping Use  . Vaping Use: Never used  Substance and Sexual Activity  . Alcohol use: Yes    Alcohol/week: 6.0 standard drinks    Types: 6 Cans of beer per week    Comment: one 6 pack daily  . Drug use: Yes    Types: Marijuana    Comment: last used 2 days ago per pt  . Sexual activity: Yes    Partners: Male    Birth control/protection: Surgical  Other Topics Concern  . Not on file  Social History Narrative  . Not on file   Social Determinants of Health   Financial Resource Strain: Not on file  Food Insecurity: Not on file  Transportation Needs: Not on file  Physical Activity: Not on file  Stress: Not on file  Social Connections: Not on file    Hospital Course:  45 year old female presented for worsening depression and suicidal ideations. She also had auditory hallucinations telling her she had done something terrible and was under arrest. During her stay she proved difficult to treat with medications. Abilify and Risperdal were titrated to max dose without any decrease in auditory hallucinations or depression.  She was placed on Zyprexa and this did help somewhat. Celexa was also changed to lexapro and titrated to 20 mg daily for depression. Despite medications at adequate doses she continued to have severe depression, suicidal ideations, auditory hallucinations, and symptoms of catatonia that did not completely resolved with Ativan. She was then started on ECT. With just three treatments of ECT she showed tremendous improvement. She began speaking to others in they day room, smiling, and attending  to ADLs. Delusions of being estranged from family and in trouble with the law also resolved. Her depression improved and she was no longer suicidal or having auditory hallucinations. She also denied homicidal ideations or visual hallucinations. She was discharged home to family with plan to continue ECT on outpatient basis and attend [facility] for continued outpatient mental health treatment. Patient, patient's family, and treatment team felt comfortable with her discharging home.   Physical Findings: AIMS: Facial and Oral Movements Muscles of Facial Expression: None, normal Lips and Perioral Area: None, normal Jaw: None, normal Tongue: None, normal,Extremity Movements Upper (arms, wrists, hands, fingers): None, normal Lower (legs, knees, ankles, toes): None, normal, Trunk Movements Neck, shoulders, hips: None, normal, Overall Severity Severity of abnormal movements (highest score from questions above): None, normal Incapacitation due to abnormal movements: None, normal Patient's awareness of abnormal movements (rate only patient's report): No Awareness, Dental Status Current problems with teeth and/or dentures?: No Does patient usually wear dentures?: No  CIWA:  CIWA-Ar Total: 4 COWS:  COWS Total Score: 1  Musculoskeletal: Strength & Muscle Tone: within normal limits Gait & Station: normal Patient leans: N/A                Psychiatric Specialty Exam:  Presentation  General Appearance: Appropriate for Environment  Eye Contact:Good  Speech:Normal Rate  Speech Volume:Normal  Handedness:Right   Mood and Affect  Mood:Euthymic  Affect:Congruent   Thought Process  Thought Processes:Goal Directed; Coherent  Descriptions of Associations:Intact  Orientation:Full (Time, Place and Person)  Thought Content:Logical  History of Schizophrenia/Schizoaffective disorder:No  Duration of Psychotic Symptoms:Less than six months  Hallucinations:Hallucinations: None  Ideas  of Reference:None  Suicidal Thoughts:Suicidal Thoughts: No  Homicidal Thoughts:Homicidal Thoughts: No   Sensorium  Memory:Immediate Fair; Recent Fair; Remote Fair  Judgment:Intact  Insight:Present   Executive Functions  Concentration:Fair  Attention Span:Fair  Recall:Fair  Fund of Knowledge:Fair  Language:Fair   Psychomotor Activity  Psychomotor Activity:Psychomotor Activity: Normal   Assets  Assets:Communication Skills; Desire for Improvement; Financial Resources/Insurance; Housing; Intimacy; Leisure Time; Physical Health; Resilience; Social Support; Talents/Skills   Sleep  Sleep:Sleep: Good Number of Hours of Sleep: 7.75    Physical Exam: Physical Exam Vitals and nursing note reviewed.  Constitutional:      Appearance: Normal appearance.  HENT:     Head: Normocephalic and atraumatic.     Right Ear: External ear normal.     Left Ear: External ear normal.     Nose: Nose normal.     Mouth/Throat:     Mouth: Mucous membranes are moist.     Pharynx: Oropharynx is clear.  Eyes:     Extraocular Movements: Extraocular movements intact.     Conjunctiva/sclera: Conjunctivae normal.     Pupils: Pupils are equal, round, and reactive to light.  Cardiovascular:     Rate and Rhythm: Normal rate.     Pulses: Normal pulses.  Pulmonary:     Effort: Pulmonary effort is normal.     Breath sounds: Normal breath sounds.  Abdominal:  General: Abdomen is flat.     Palpations: Abdomen is soft.  Musculoskeletal:        General: No swelling. Normal range of motion.     Cervical back: Normal range of motion and neck supple.  Skin:    General: Skin is warm and dry.  Neurological:     General: No focal deficit present.     Mental Status: She is alert and oriented to person, place, and time.  Psychiatric:        Mood and Affect: Mood normal.        Behavior: Behavior normal.        Thought Content: Thought content normal.        Judgment: Judgment normal.     Review of Systems  Constitutional: Negative.   HENT: Negative.   Eyes: Negative.   Respiratory: Negative.   Cardiovascular: Negative.   Gastrointestinal: Negative.   Genitourinary: Negative.   Musculoskeletal: Negative.   Skin: Negative.   Neurological: Negative.   Endo/Heme/Allergies: Positive for environmental allergies. Does not bruise/bleed easily.  Psychiatric/Behavioral: Negative for depression, hallucinations, memory loss, substance abuse and suicidal ideas. The patient is not nervous/anxious and does not have insomnia.    Blood pressure 109/65, pulse 74, temperature 98.2 F (36.8 C), resp. rate 14, height 5\' 4"  (1.626 m), weight 47.6 kg, SpO2 97 %. Body mass index is 18.02 kg/m.   Have you used any form of tobacco in the last 30 days? (Cigarettes, Smokeless Tobacco, Cigars, and/or Pipes): Yes  Has this patient used any form of tobacco in the last 30 days? (Cigarettes, Smokeless Tobacco, Cigars, and/or Pipes)  Yes, A prescription for an FDA-approved tobacco cessation medication was offered at discharge and the patient refused  Blood Alcohol level:  Lab Results  Component Value Date   [facility] <10 10/18/2020    Metabolic Disorder Labs:  Lab Results  Component Value Date   HGBA1C 5.6 10/20/2020   MPG 114.02 10/20/2020   No results found for: PROLACTIN Lab Results  Component Value Date   CHOL 184 10/20/2020   TRIG 143 10/20/2020   HDL 51 10/20/2020   CHOLHDL 3.6 10/20/2020   VLDL 29 10/20/2020   LDLCALC 104 (H) 10/20/2020    See Psychiatric Specialty Exam and Suicide Risk Assessment completed by Attending Physician prior to discharge.  Discharge destination:  Home  Is patient on multiple antipsychotic therapies at discharge:  No   Has Patient had three or more failed trials of antipsychotic monotherapy by history:  Yes,   Antipsychotic medications that previously failed include:   1.  Risperdal. and 2.  Abilify.  Recommended Plan for Multiple Antipsychotic  Therapies: NA  Discharge Instructions    Diet - low sodium heart healthy   Complete by: As directed    Increase activity slowly   Complete by: As directed      Allergies as of 11/15/2020      Reactions   Penicillins Hives, Nausea Only      Medication List    STOP taking these medications   citalopram 20 MG tablet Commonly known as: CELEXA     TAKE these medications     Indication  escitalopram 10 MG tablet Commonly known as: LEXAPRO Take 2 tablets (20 mg total) by mouth at bedtime.  Indication: Major Depressive Disorder   naltrexone 50 MG tablet Commonly known as: DEPADE Take 1 tablet (50 mg total) by mouth daily.  Indication: Abuse or Misuse of Alcohol   OLANZapine zydis 20 MG  disintegrating tablet Commonly known as: ZYPREXA Take 1 tablet (20 mg total) by mouth at bedtime.  Indication: Major Depressive Disorder   traZODone 50 MG tablet Commonly known as: DESYREL Take 1 tablet (50 mg total) by mouth at bedtime as needed for sleep.  Indication: Major Depressive Disorder       Follow-up Information    [facility] [facility] on 11/17/2020.   Why: Your appointment is scheduled for 11/17/2020 at 2:30PM.  Please bring your insurance information.  Thanks! Contact information: [address] [place] [address] [phone]               Follow-up recommendations:  Activity:  as tolerated, regular diet   Comments:  30-day scripts with 1 refill provided at discharge.   Signed: [name], MD 11/16/2020, 6:01 PM

## 2020-11-17 ENCOUNTER — Encounter
Admit: 2020-11-17 | Discharge: 2020-11-17 | Disposition: A | Discharge status: Home / Self Care | Payer: BC Managed Care – PPO | Source: Ambulatory Visit | Attending: Psychiatry | Admitting: Psychiatry

## 2020-11-17 ENCOUNTER — Other Ambulatory Visit: Payer: Self-pay

## 2020-11-17 ENCOUNTER — Encounter: Payer: Self-pay | Admitting: Registered Nurse

## 2020-11-17 DIAGNOSIS — F17210 Nicotine dependence, cigarettes, uncomplicated: Secondary | ICD-10-CM | POA: Diagnosis not present

## 2020-11-17 DIAGNOSIS — R410 Disorientation, unspecified: Secondary | ICD-10-CM | POA: Insufficient documentation

## 2020-11-17 DIAGNOSIS — F1290 Cannabis use, unspecified, uncomplicated: Secondary | ICD-10-CM | POA: Insufficient documentation

## 2020-11-17 DIAGNOSIS — F333 Major depressive disorder, recurrent, severe with psychotic symptoms: Secondary | ICD-10-CM | POA: Diagnosis not present

## 2020-11-17 DIAGNOSIS — F323 Major depressive disorder, single episode, severe with psychotic features: Secondary | ICD-10-CM | POA: Insufficient documentation

## 2020-11-17 LAB — POCT PREGNANCY, URINE: Preg Test, Ur: NEGATIVE

## 2020-11-17 MED ORDER — ONDANSETRON HCL 4 MG/2ML IJ SOLN
4.0000 mg | Freq: Once | INTRAMUSCULAR | Status: AC
  Administered 2020-11-17: 4 mg via INTRAVENOUS

## 2020-11-17 MED ORDER — SUCCINYLCHOLINE CHLORIDE 20 MG/ML IJ SOLN
INTRAMUSCULAR | Status: DC | PRN
  Administered 2020-11-17: 70 mg via INTRAVENOUS

## 2020-11-17 MED ORDER — SODIUM CHLORIDE 0.9 % IV SOLN: INTRAVENOUS | Status: DC | PRN

## 2020-11-17 MED ORDER — ONDANSETRON HCL 4 MG/2ML IJ SOLN
INTRAMUSCULAR | Status: AC
  Filled 2020-11-17: qty 2

## 2020-11-17 MED ORDER — SODIUM CHLORIDE 0.9 % IV SOLN
500.0000 mL | Freq: Once | INTRAVENOUS | Status: AC
  Administered 2020-11-17: 500 mL via INTRAVENOUS

## 2020-11-17 MED ORDER — METHOHEXITAL SODIUM 100 MG/10ML IV SOSY
PREFILLED_SYRINGE | INTRAVENOUS | Status: DC | PRN
  Administered 2020-11-17: 70 mg via INTRAVENOUS

## 2020-11-17 MED ORDER — GLYCOPYRROLATE 0.2 MG/ML IJ SOLN
INTRAMUSCULAR | Status: AC
  Filled 2020-11-17: qty 1

## 2020-11-17 MED ORDER — FENTANYL CITRATE (PF) 100 MCG/2ML IJ SOLN: 25.0000 ug | INTRAMUSCULAR | Status: DC | PRN

## 2020-11-17 MED ORDER — GLYCOPYRROLATE 0.2 MG/ML IJ SOLN
0.1000 mg | Freq: Once | INTRAMUSCULAR | Status: AC
  Administered 2020-11-17: 0.1 mg via INTRAVENOUS

## 2020-11-17 MED ORDER — ONDANSETRON HCL 4 MG/2ML IJ SOLN: 4.0000 mg | Freq: Once | INTRAMUSCULAR | Status: DC | PRN

## 2020-11-17 MED ORDER — MIDAZOLAM HCL 2 MG/2ML IJ SOLN: 2.0000 mg | Freq: Once | INTRAMUSCULAR | Status: DC

## 2020-11-17 NOTE — Anesthesia Preprocedure Evaluation (Signed)
Anesthesia Evaluation  Patient identified by MRN, date of birth, ID band Patient awake    Reviewed: Allergy & Precautions, H&P , NPO status , Patient's Chart, lab work & pertinent test results, reviewed documented beta blocker date and time   Airway Mallampati: II   Neck ROM: full    Dental  (+) Teeth Intact   Pulmonary neg pulmonary ROS, Current Smoker,    Pulmonary exam normal        Cardiovascular Exercise Tolerance: Poor negative cardio ROS Normal cardiovascular exam Rhythm:regular Rate:Normal     Neuro/Psych PSYCHIATRIC DISORDERS Anxiety Depression negative neurological ROS     GI/Hepatic negative GI ROS, Neg liver ROS,   Endo/Other  negative endocrine ROS  Renal/GU negative Renal ROS  negative genitourinary   Musculoskeletal   Abdominal   Peds  Hematology negative hematology ROS (+)   Anesthesia Other Findings History reviewed. No pertinent past medical history. History reviewed. No pertinent surgical history. BMI    Body Mass Index: 18.02 kg/m     Reproductive/Obstetrics negative OB ROS                             Anesthesia Physical Anesthesia Plan  ASA: III  Anesthesia Plan: General   Post-op Pain Management:    Induction:   PONV Risk Score and Plan:   Airway Management Planned:   Additional Equipment:   Intra-op Plan:   Post-operative Plan:   Informed Consent: I have reviewed the patients History and Physical, chart, labs and discussed the procedure including the risks, benefits and alternatives for the proposed anesthesia with the patient or authorized representative who has indicated his/her understanding and acceptance.     Dental Advisory Given  Plan Discussed with: CRNA  Anesthesia Plan Comments:         Anesthesia Quick Evaluation

## 2020-11-17 NOTE — Transfer of Care (Signed)
Immediate Anesthesia Transfer of Care Note  Patient: Cristina Finley  Procedure(s) Performed: ECT TX  Patient Location: PACU  Anesthesia Type:General  Level of Consciousness: sedated  Airway & Oxygen Therapy: Patient Spontanous Breathing  Post-op Assessment: Report given to RN and Post -op Vital signs reviewed and stable  Post vital signs: Reviewed and stable  Last Vitals:  Vitals Value Taken Time  BP 131/95 11/17/20 1335  Temp    Pulse 87 11/17/20 1337  Resp 11 11/17/20 1337  SpO2 100 % 11/17/20 1337  Vitals shown include unvalidated device data.  Last Pain:  Vitals:   11/17/20 1154  TempSrc:   PainSc: 0-No pain         Complications: No complications documented.

## 2020-11-17 NOTE — Procedures (Signed)
ECT SERVICES Physician's Interval Evaluation & Treatment Note  Patient Identification: Cristina Finley MRN:  [id number] Date of Evaluation:  11/17/2020 TX #: 4  MADRS:   MMSE:   P.E. Findings:  No change to physical exam  Psychiatric Interval Note:  Patient is now at home.  Clearly improved from previous baseline but also still anxious and a bit dysphoric and quiet  Subjective:  Patient is a 45 y.o. female seen for evaluation for Electroconvulsive Therapy. No specific complaints some memory problems noted  Treatment Summary:   [x]   Right Unilateral             []  Bilateral   % Energy : 0.3 ms 50%   Impedance: 1850 ohms  Seizure Energy Index: No reading thanks to the computer but she clearly had a good amplitude seizure  Postictal Suppression Index: Also no finding but  Seizure Concordance Index: 75%  Medications  Pre Shock: Robinul 0.1 mg Brevital 70 mg succinylcholine 70 mg  Post Shock: Versed 2 mg  Seizure Duration: EMG 35 seconds EEG 60 seconds   Comments: Continues to tolerate treatments well although I am concerned about some memory problems.  Continue index course next treatment Wednesday  Lungs:  [x]   Clear to auscultation               []  Other:   Heart:    [x]   Regular rhythm             []  irregular rhythm    [x]   Previous H&P reviewed, patient examined and there are NO CHANGES                 []   Previous H&P reviewed, patient examined and there are changes noted.   [employer], MD 6/8/20227:29 PM

## 2020-11-17 NOTE — H&P (Signed)
Cristina Finley is an 45 y.o. female.   Chief Complaint: Patient herself has no chief complaint.  She looks a little confused and dysphoric and anxious still. HPI: History of recent severe psychotic depression just now out of the hospital and partially in recovery  History reviewed. No pertinent past medical history.  History reviewed. No pertinent surgical history.  History reviewed. No pertinent family history. Social History:  reports that she has been smoking cigarettes. She has been smoking about 1.50 packs per day. She has never used smokeless tobacco. She reports current alcohol use of about 6.0 standard drinks of alcohol per week. She reports current drug use. Drug: Marijuana.  Allergies:  Allergies  Allergen Reactions  . Penicillins Hives and Nausea Only    (Not in a hospital admission)   Results for orders placed or performed during the hospital encounter of 11/17/20 (from the past 48 hour(s))  Pregnancy, urine POC     Status: None   Collection Time: 11/17/20 11:42 AM  Result Value Ref Range   Preg Test, Ur NEGATIVE NEGATIVE    Comment:        THE SENSITIVITY OF THIS METHODOLOGY IS >24 mIU/mL    No results found.  Review of Systems  Constitutional: Negative.   HENT: Negative.   Eyes: Negative.   Respiratory: Negative.   Cardiovascular: Negative.   Gastrointestinal: Negative.   Musculoskeletal: Negative.   Skin: Negative.   Neurological: Negative.   Psychiatric/Behavioral: Positive for confusion. Negative for dysphoric mood and hallucinations. The patient is nervous/anxious.     Blood pressure 128/90, pulse 89, temperature (!) 97.1 F (36.2 C), temperature source Oral, resp. rate (!) 30, height 5\' 4"  (1.626 m), weight 47.6 kg, SpO2 100 %. Physical Exam Vitals and nursing note reviewed.  Constitutional:      Appearance: She is well-developed.  HENT:     Head: Normocephalic and atraumatic.  Eyes:     Conjunctiva/sclera: Conjunctivae normal.     Pupils:  Pupils are equal, round, and reactive to light.  Cardiovascular:     Heart sounds: Normal heart sounds.  Pulmonary:     Effort: Pulmonary effort is normal.  Abdominal:     Palpations: Abdomen is soft.  Musculoskeletal:        General: Normal range of motion.     Cervical back: Normal range of motion.  Skin:    General: Skin is warm and dry.  Neurological:     General: No focal deficit present.     Mental Status: She is alert.  Psychiatric:        Attention and Perception: She is inattentive.        Mood and Affect: Mood is anxious.        Speech: Speech is delayed.        Behavior: Behavior is slowed.        Thought Content: Thought content does not include homicidal or suicidal ideation.        Cognition and Memory: Memory is impaired.      Assessment/Plan Continue index course today's treatment #3.  , MD 11/17/2020, 7:28 PM

## 2020-11-18 NOTE — Anesthesia Postprocedure Evaluation (Signed)
Anesthesia Post Note  Patient: Cristina Finley  Procedure(s) Performed: ECT TX  Patient location during evaluation: PACU Anesthesia Type: General Level of consciousness: awake and alert Pain management: pain level controlled Vital Signs Assessment: post-procedure vital signs reviewed and stable Respiratory status: spontaneous breathing, nonlabored ventilation, respiratory function stable and patient connected to nasal cannula oxygen Cardiovascular status: blood pressure returned to baseline and stable Postop Assessment: no apparent nausea or vomiting Anesthetic complications: no   No notable events documented.   Last Vitals:  Vitals:   11/17/20 1342 11/17/20 1400  BP:  128/90  Pulse: 90 89  Resp: (!) 32 (!) 30  Temp:  (!) 36.2 C  SpO2: 100%     Last Pain:  Vitals:   11/17/20 1400  TempSrc: Oral  PainSc: 0-No pain                 Yevette Edwards

## 2020-11-19 ENCOUNTER — Other Ambulatory Visit: Payer: Self-pay | Admitting: Psychiatry

## 2020-11-19 ENCOUNTER — Encounter: Payer: Self-pay | Admitting: Registered Nurse

## 2020-11-19 ENCOUNTER — Inpatient Hospital Stay: Admission: RE | Admit: 2020-11-19 | Payer: BC Managed Care – PPO | Source: Ambulatory Visit

## 2020-11-19 MED ORDER — SODIUM CHLORIDE 0.9 % IV SOLN: INTRAVENOUS | Status: DC | PRN

## 2020-11-22 ENCOUNTER — Other Ambulatory Visit: Payer: Self-pay | Admitting: Psychiatry

## 2020-12-23 ENCOUNTER — Other Ambulatory Visit: Payer: Self-pay

## 2020-12-23 ENCOUNTER — Ambulatory Visit (INDEPENDENT_AMBULATORY_CARE_PROVIDER_SITE_OTHER): Payer: BC Managed Care – PPO | Admitting: Nurse Practitioner

## 2020-12-23 ENCOUNTER — Encounter: Payer: Self-pay | Admitting: Nurse Practitioner

## 2020-12-23 VITALS — BP 116/72 | HR 76 | Temp 98.1°F | Ht 64.5 in | Wt 129.6 lb

## 2020-12-23 DIAGNOSIS — F1010 Alcohol abuse, uncomplicated: Secondary | ICD-10-CM

## 2020-12-23 DIAGNOSIS — F333 Major depressive disorder, recurrent, severe with psychotic symptoms: Secondary | ICD-10-CM | POA: Diagnosis not present

## 2020-12-23 DIAGNOSIS — Z720 Tobacco use: Secondary | ICD-10-CM

## 2020-12-23 DIAGNOSIS — F418 Other specified anxiety disorders: Secondary | ICD-10-CM

## 2020-12-23 DIAGNOSIS — Z7689 Persons encountering health services in other specified circumstances: Secondary | ICD-10-CM

## 2020-12-23 MED ORDER — ESCITALOPRAM OXALATE 20 MG PO TABS: 20.0000 mg | ORAL_TABLET | Freq: Every day | ORAL | 0 refills | Status: DC

## 2020-12-23 NOTE — Assessment & Plan Note (Signed)
Chronic.  Smoking 1 PPD. Not ready to quit at this time. Will reassess at future visits.

## 2020-12-23 NOTE — Progress Notes (Signed)
BP 116/72   Pulse 76   Temp 98.1 F (36.7 C)   Ht 5' 4.5" (1.638 m)   Wt 129 lb 9.6 oz (58.8 kg)   SpO2 98%   BMI 21.90 kg/m    Subjective:    Patient ID: Cristina Finley, female    DOB: 04-26-1976, 45 y.o.   MRN: [id number]  HPI: Cristina Finley is a 45 y.o. female  Chief Complaint  Patient presents with   Establish Care   Depression   Patient presents to clinic to establish care with new PCP.  Patient reports a history of Depression and anxiety (in the hospital in June for about a month with depression/anxiety and psychosis).  Patient is a current everyday smoker about 1 ppd.  Patient has not been drinking since she was discharged from the hospital around June 6.  She feels like the Zyprexa and Lexapro are working but could be adjusted to help more. Denies SI which is an improvement since she was in the hospital.  Patient denies other concerns at visit today. Is trying to make a follow up appointment with Psychiatrist.   Patient denies a history of: Hypertension, Elevated Cholesterol, Diabetes, Thyroid problems, Neurological problems, and Abdominal problems.   Patient had her gallbladder taken out in 2000.   Denies HA, CP, SOB, dizziness, palpitations, visual changes, and lower extremity swelling.   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 Interim medical history since our last visit reviewed. Allergies and medications reviewed and updated.  Review of Systems  Eyes:  Negative for visual disturbance.  Respiratory:  Negative for cough, chest tightness and shortness of breath.   Cardiovascular:  Negative for chest pain, palpitations and leg swelling.  Neurological:  Negative for dizziness and headaches.  Psychiatric/Behavioral:  Positive for dysphoric mood. Negative for suicidal ideas. The patient is nervous/anxious.    Per HPI unless specifically indicated above     Objective:    BP 116/72   Pulse 76   Temp 98.1 F (36.7 C)   Ht 5' 4.5" (1.638 m)   Wt 129 lb 9.6 oz (58.8 kg)   SpO2 98%   BMI 21.90 kg/m   Wt Readings from Last 3 Encounters:  12/23/20 129 lb 9.6 oz (58.8 kg)  11/17/20 105 lb (47.6 kg)  11/15/20 105 lb (47.6 kg)    Physical Exam Vitals and nursing note reviewed.  Constitutional:      General: She is not in acute distress.    Appearance: Normal appearance. She is normal weight. She is not ill-appearing, toxic-appearing or diaphoretic.  HENT:     Head: Normocephalic.     Right Ear: External ear normal.  Left Ear: External ear normal.     Nose: Nose normal.     Mouth/Throat:     Mouth: Mucous membranes are moist.     Pharynx: Oropharynx is clear.  Eyes:     General:        Right eye: No discharge.        Left eye: No discharge.     Extraocular Movements: Extraocular movements intact.     Conjunctiva/sclera: Conjunctivae normal.     Pupils: Pupils are equal, round, and reactive to light.  Cardiovascular:     Rate and Rhythm: Normal rate and regular rhythm.     Heart sounds: No murmur heard. Pulmonary:     Effort: Pulmonary effort is normal. No respiratory distress.     Breath sounds: Normal breath sounds. No wheezing or rales.   Musculoskeletal:     Cervical back: Normal range of motion and neck supple.  Skin:    General: Skin is warm and dry.     Capillary Refill: Capillary refill takes less than 2 seconds.  Neurological:     General: No focal deficit present.     Mental Status: She is alert and oriented to person, place, and time. Mental status is at baseline.  Psychiatric:        Mood and Affect: Mood normal.        Behavior: Behavior normal.        Thought Content: Thought content normal.        Judgment: Judgment normal.    Results for orders placed or performed during the hospital encounter of 11/17/20  Pregnancy, urine POC  Result Value Ref Range   Preg Test, Ur NEGATIVE NEGATIVE      Assessment & Plan:   Problem List Items Addressed This Visit       Other   Depression with anxiety    Chronic.  Ongoing.  Follows up with psychiatry who manages medications.  Patient given refill today because she was running out.  Only given 30 tabs but she takes two per day.        Relevant Medications   escitalopram (LEXAPRO) 20 MG tablet   Alcohol abuse    Chronic.  Patient has not been drinking since she got out of the hospital.  She is taking naltrexone to help with this.  Needs to make appointment with Psychiatrist since discharge.        Nicotine abuse    Chronic.  Smoking 1 PPD. Not ready to quit at this time. Will reassess at future visits.        MDD (major depressive disorder), recurrent, severe, with psychosis (HCC) - Primary    Chronic.  Ongoing.  Recommend continuing to follow up with Psychiatry for medication management.        Relevant Medications   escitalopram (LEXAPRO) 20 MG tablet   Other Visit Diagnoses     Encounter to establish care       Return in 1 month for physical and fasting labs.        Follow up plan: Return in about 1 month (around 01/23/2021) for Physical and Fasting labs.

## 2020-12-23 NOTE — Assessment & Plan Note (Signed)
Chronic.  Patient has not been drinking since she got out of the hospital.  She is taking naltrexone to help with this.  Needs to make appointment with Psychiatrist since discharge.

## 2020-12-23 NOTE — Assessment & Plan Note (Signed)
Chronic.  Ongoing.  Follows up with psychiatry who manages medications.  Patient given refill today because she was running out.  Only given 30 tabs but she takes two per day.

## 2020-12-23 NOTE — Assessment & Plan Note (Signed)
Chronic.  Ongoing.  Recommend continuing to follow up with Psychiatry for medication management.

## 2021-02-02 ENCOUNTER — Encounter: Payer: Self-pay | Admitting: Nurse Practitioner

## 2021-02-02 ENCOUNTER — Ambulatory Visit (INDEPENDENT_AMBULATORY_CARE_PROVIDER_SITE_OTHER): Payer: BC Managed Care – PPO | Admitting: Nurse Practitioner

## 2021-02-02 ENCOUNTER — Other Ambulatory Visit: Payer: Self-pay

## 2021-02-02 VITALS — BP 138/88 | HR 76 | Temp 98.2°F | Ht 63.98 in | Wt 134.0 lb

## 2021-02-02 DIAGNOSIS — F333 Major depressive disorder, recurrent, severe with psychotic symptoms: Secondary | ICD-10-CM | POA: Diagnosis not present

## 2021-02-02 DIAGNOSIS — Z720 Tobacco use: Secondary | ICD-10-CM

## 2021-02-02 DIAGNOSIS — F418 Other specified anxiety disorders: Secondary | ICD-10-CM

## 2021-02-02 DIAGNOSIS — Z0000 Encounter for general adult medical examination without abnormal findings: Secondary | ICD-10-CM | POA: Diagnosis not present

## 2021-02-02 DIAGNOSIS — Z23 Encounter for immunization: Secondary | ICD-10-CM | POA: Diagnosis not present

## 2021-02-02 DIAGNOSIS — Z1159 Encounter for screening for other viral diseases: Secondary | ICD-10-CM

## 2021-02-02 DIAGNOSIS — Z114 Encounter for screening for human immunodeficiency virus [HIV]: Secondary | ICD-10-CM

## 2021-02-02 LAB — URINALYSIS, ROUTINE W REFLEX MICROSCOPIC
Bilirubin, UA: NEGATIVE
Glucose, UA: NEGATIVE
Ketones, UA: NEGATIVE
Leukocytes,UA: NEGATIVE
Nitrite, UA: NEGATIVE
Protein,UA: NEGATIVE
RBC, UA: NEGATIVE
Specific Gravity, UA: 1.02 (ref 1.005–1.030)
Urobilinogen, Ur: 0.2 mg/dL (ref 0.2–1.0)
pH, UA: 7 (ref 5.0–7.5)

## 2021-02-02 NOTE — Addendum Note (Signed)
Addended by: Leward Quan A on: 02/02/2021 03:11 PM   Modules accepted: Orders

## 2021-02-02 NOTE — Assessment & Plan Note (Signed)
Chronic. Continues to follow up with Psychiatry.  Continue to follow per their recommendations.

## 2021-02-02 NOTE — Assessment & Plan Note (Signed)
Chronic. Continues to follow up with Psychiatry.  Continue to follow per their recommendations. Patient is trying to get ECT therapy to help with depression.

## 2021-02-02 NOTE — Progress Notes (Signed)
BP 138/88   Pulse 76   Temp 98.2 F (36.8 C) (Oral)   Ht 5' 3.98" (1.625 m)   Wt 134 lb (60.8 kg)   LMP 01/15/2021   SpO2 95%   BMI 23.02 kg/m    Subjective:    Patient ID: Cristina Finley, female    DOB: 08/12/1975, 45 y.o.   MRN: [id number]  HPI: Cristina Finley is a 45 y.o. female presenting on 02/02/2021 for comprehensive medical examination. Current medical complaints include:none  She currently lives with: Menopausal Symptoms: no  HYPERTENSION / HYPERLIPIDEMIA Satisfied with current treatment? no Duration of hypertension: years BP monitoring frequency: not checking BP range:  BP medication side effects: no Past BP meds: none Duration of hyperlipidemia: years Cholesterol medication side effects: no Cholesterol supplements: none Past cholesterol medications: none Medication compliance: excellent compliance Aspirin: no Recent stressors: no Recurrent headaches: no Visual changes: no Palpitations: no Dyspnea: no Chest pain: no Lower extremity edema: no Dizzy/lightheaded: no  ANXIETY/DEPRESSION Patient continues to follow up with psychiatry.  She hasn't seen them for awhile. Denies SI. Denies concerns at visit today.   Depression Screen done today and results listed below:  [facility] 02/02/2021 12/23/2020  Decreased Interest 1 1  Down, Depressed, Hopeless 1 2  PHQ - 2 Score 2 3  Altered sleeping 0 1  Tired, decreased energy 1 0  Change in appetite 1 0  Feeling bad or failure about yourself  0 1  Trouble concentrating 1 2  Moving slowly or fidgety/restless 3 0  Suicidal thoughts 0 0  PHQ-9 Score 8 7  Difficult doing work/chores - Not difficult at all    The patient does not have a history of falls. I did complete a risk assessment for falls. A plan of care for falls was documented.   Past Medical History:  Past Medical History:  Diagnosis Date   Depression     Surgical History:  Past Surgical History:  Procedure Laterality Date    CHOLECYSTECTOMY  2000    Medications:  Current Outpatient Medications on File Prior to Visit  Medication Sig   escitalopram (LEXAPRO) 20 MG tablet Take 1 tablet (20 mg total) by mouth at bedtime.   naltrexone (DEPADE) 50 MG tablet Take 1 tablet (50 mg total) by mouth daily.   OLANZapine zydis (ZYPREXA) 20 MG disintegrating tablet Take 1 tablet (20 mg total) by mouth at bedtime.   traZODone (DESYREL) 50 MG tablet Take 1 tablet (50 mg total) by mouth at bedtime as needed for sleep.   No current facility-administered medications on file prior to visit.    Allergies:  Allergies  Allergen Reactions   Amoxicillin Hives   Penicillins Hives and Nausea Only    Social History:  Social History   Socioeconomic History   Marital status: Married    Spouse name: Not on file   Number of children: Not on file   Years of education: Not on file   Highest education level: Not on file  Occupational History   Not on file  Tobacco Use   Smoking status: Every Day    Packs/day: 1.00    Types: Cigarettes   Smokeless tobacco: Never  Vaping Use   Vaping Use: Never used  Substance and Sexual Activity   Alcohol use: Not Currently    Alcohol/week: 6.0 standard drinks    Types: 6 Cans of beer per week    Comment: one 6 pack daily   Drug use: Not Currently  Types: Marijuana   Sexual activity: Yes    Partners: Male    Birth control/protection: Surgical  Other Topics Concern   Not on file  Social History Narrative   Not on file   Social Determinants of Health   Financial Resource Strain: Not on file  Food Insecurity: Not on file  Transportation Needs: Not on file  Physical Activity: Not on file  Stress: Not on file  Social Connections: Not on file  Intimate Partner Violence: Not on file   Social History   Tobacco Use  Smoking Status Every Day   Packs/day: 1.00   Types: Cigarettes  Smokeless Tobacco Never   Social History   Substance and Sexual Activity  Alcohol Use Not  Currently   Alcohol/week: 6.0 standard drinks   Types: 6 Cans of beer per week   Comment: one 6 pack daily    Family History:  Family History  Problem Relation Age of Onset   Depression Mother    COPD Father    Depression Paternal Aunt    Heart disease Maternal Grandmother    Cancer Maternal Grandfather    Cancer Paternal Grandmother     Past medical history, surgical history, medications, allergies, family history and social history reviewed with patient today and changes made to appropriate areas of the chart.   Review of Systems  Eyes:  Negative for blurred vision and double vision.  Respiratory:  Negative for shortness of breath.   Cardiovascular:  Negative for chest pain, palpitations and leg swelling.  Neurological:  Negative for dizziness and headaches.  All other ROS negative except what is listed above and in the HPI.      Objective:    BP 138/88   Pulse 76   Temp 98.2 F (36.8 C) (Oral)   Ht 5' 3.98" (1.625 m)   Wt 134 lb (60.8 kg)   LMP 01/15/2021   SpO2 95%   BMI 23.02 kg/m   Wt Readings from Last 3 Encounters:  02/02/21 134 lb (60.8 kg)  12/23/20 129 lb 9.6 oz (58.8 kg)  11/17/20 105 lb (47.6 kg)    Physical Exam Vitals and nursing note reviewed.  Constitutional:      General: She is awake. She is not in acute distress.    Appearance: She is well-developed. She is not ill-appearing.  HENT:     Head: Normocephalic and atraumatic.     Right Ear: Hearing, tympanic membrane, ear canal and external ear normal. No drainage.     Left Ear: Hearing, tympanic membrane, ear canal and external ear normal. No drainage.     Nose: Nose normal.     Right Sinus: No maxillary sinus tenderness or frontal sinus tenderness.     Left Sinus: No maxillary sinus tenderness or frontal sinus tenderness.     Mouth/Throat:     Mouth: Mucous membranes are moist.     Pharynx: Oropharynx is clear. Uvula midline. No pharyngeal swelling, oropharyngeal exudate or posterior  oropharyngeal erythema.  Eyes:     General: Lids are normal.        Right eye: No discharge.        Left eye: No discharge.     Extraocular Movements: Extraocular movements intact.     Conjunctiva/sclera: Conjunctivae normal.     Pupils: Pupils are equal, round, and reactive to light.     Visual Fields: Right eye visual fields normal and left eye visual fields normal.  Neck:     Thyroid: No thyromegaly.  Vascular: No carotid bruit.     Trachea: Trachea normal.  Cardiovascular:     Rate and Rhythm: Normal rate and regular rhythm.     Heart sounds: Normal heart sounds. No murmur heard.   No gallop.  Pulmonary:     Effort: Pulmonary effort is normal. No accessory muscle usage or respiratory distress.     Breath sounds: Normal breath sounds.  Chest:  Breasts:    Right: Normal.     Left: Normal.  Abdominal:     General: Bowel sounds are normal.     Palpations: Abdomen is soft. There is no hepatomegaly or splenomegaly.     Tenderness: There is no abdominal tenderness.  Musculoskeletal:        General: Normal range of motion.     Cervical back: Normal range of motion and neck supple.     Right lower leg: No edema.     Left lower leg: No edema.  Lymphadenopathy:     Head:     Right side of head: No submental, submandibular, tonsillar, preauricular or posterior auricular adenopathy.     Left side of head: No submental, submandibular, tonsillar, preauricular or posterior auricular adenopathy.     Cervical: No cervical adenopathy.     Upper Body:     Right upper body: No supraclavicular, axillary or pectoral adenopathy.     Left upper body: No supraclavicular, axillary or pectoral adenopathy.  Skin:    General: Skin is warm and dry.     Capillary Refill: Capillary refill takes less than 2 seconds.     Findings: No rash.  Neurological:     Mental Status: She is alert and oriented to person, place, and time.     Cranial Nerves: Cranial nerves are intact.     Gait: Gait is  intact.     Deep Tendon Reflexes: Reflexes are normal and symmetric.     Reflex Scores:      Brachioradialis reflexes are 2+ on the right side and 2+ on the left side.      Patellar reflexes are 2+ on the right side and 2+ on the left side. Psychiatric:        Attention and Perception: Attention normal.        Mood and Affect: Mood normal.        Speech: Speech normal.        Behavior: Behavior normal. Behavior is cooperative.        Thought Content: Thought content normal.        Judgment: Judgment normal.    Results for orders placed or performed during the hospital encounter of 11/17/20  Pregnancy, urine POC  Result Value Ref Range   Preg Test, Ur NEGATIVE NEGATIVE      Assessment & Plan:   Problem List Items Addressed This Visit       Other   Depression with anxiety    Chronic. Continues to follow up with Psychiatry.  Continue to follow per their recommendations.       Nicotine abuse    Current everyday smoker. Not ready to quit at this time. Will reassess at future visits.       MDD (major depressive disorder), recurrent, severe, with psychosis (HCC)    Chronic. Continues to follow up with Psychiatry.  Continue to follow per their recommendations. Patient is trying to get ECT therapy to help with depression.      Other Visit Diagnoses     Annual physical exam    -  Primary   Health maintenance reviewed today during visit. Declined colonoscopy and TDAP. Pneumovax given in office today. Labs ordered.    Relevant Orders   CBC with Differential/Platelet   Comprehensive metabolic panel   Lipid panel   TSH   Urinalysis, Routine w reflex microscopic   Encounter for hepatitis C screening test for low risk patient       Screening for HIV (human immunodeficiency virus)       Relevant Orders   HIV Antibody (routine testing w rflx)        Follow up plan: Return in about 1 year (around 02/02/2022) for Physical and Fasting labs.   LABORATORY TESTING:  - Pap smear:  pap done  IMMUNIZATIONS:   - Tdap: Tetanus vaccination status reviewed: last tetanus booster within 10 years. - Influenza: Postponed to flu season - Pneumovax: Administered today - Prevnar: Not applicable - HPV: Not applicable - Zostavax vaccine: Not applicable  SCREENING: -Mammogram: Ordered today  - Colonoscopy: Refused  - Bone Density: Not applicable  -Hearing Test: Not applicable  -Spirometry: Not applicable   PATIENT COUNSELING:   Advised to take 1 mg of folate supplement per day if capable of pregnancy.   Sexuality: Discussed sexually transmitted diseases, partner selection, use of condoms, avoidance of unintended pregnancy  and contraceptive alternatives.   Advised to avoid cigarette smoking.  I discussed with the patient that most people either abstain from alcohol or drink within safe limits (<=14/week and <=4 drinks/occasion for males, <=7/weeks and <= 3 drinks/occasion for females) and that the risk for alcohol disorders and other health effects rises proportionally with the number of drinks per week and how often a drinker exceeds daily limits.  Discussed cessation/primary prevention of drug use and availability of treatment for abuse.   Diet: Encouraged to adjust caloric intake to maintain  or achieve ideal body weight, to reduce intake of dietary saturated fat and total fat, to limit sodium intake by avoiding high sodium foods and not adding table salt, and to maintain adequate dietary potassium and calcium preferably from fresh fruits, vegetables, and low-fat dairy products.    stressed the importance of regular exercise  Injury prevention: Discussed safety belts, safety helmets, smoke detector, smoking near bedding or upholstery.   Dental health: Discussed importance of regular tooth brushing, flossing, and dental visits.    NEXT PREVENTATIVE PHYSICAL DUE IN 1 YEAR. Return in about 1 year (around 02/02/2022) for Physical and Fasting labs.

## 2021-02-02 NOTE — Assessment & Plan Note (Signed)
Current everyday smoker. Not ready to quit at this time. Will reassess at future visits.

## 2021-02-03 LAB — CBC WITH DIFFERENTIAL/PLATELET
Basophils Absolute: 0 10*3/uL (ref 0.0–0.2)
Basos: 0 %
EOS (ABSOLUTE): 0.1 10*3/uL (ref 0.0–0.4)
Eos: 1 %
Hematocrit: 38.4 % (ref 34.0–46.6)
Hemoglobin: 13.1 g/dL (ref 11.1–15.9)
Immature Grans (Abs): 0 10*3/uL (ref 0.0–0.1)
Immature Granulocytes: 0 %
Lymphocytes Absolute: 3.6 10*3/uL — ABNORMAL HIGH (ref 0.7–3.1)
Lymphs: 32 %
MCH: 30.5 pg (ref 26.6–33.0)
MCHC: 34.1 g/dL (ref 31.5–35.7)
MCV: 90 fL (ref 79–97)
Monocytes Absolute: 0.8 10*3/uL (ref 0.1–0.9)
Monocytes: 7 %
Neutrophils Absolute: 6.7 10*3/uL (ref 1.4–7.0)
Neutrophils: 60 %
Platelets: 237 10*3/uL (ref 150–450)
RBC: 4.29 x10E6/uL (ref 3.77–5.28)
RDW: 13.9 % (ref 11.7–15.4)
WBC: 11.2 10*3/uL — ABNORMAL HIGH (ref 3.4–10.8)

## 2021-02-03 LAB — COMPREHENSIVE METABOLIC PANEL
ALT: 15 IU/L (ref 0–32)
AST: 16 IU/L (ref 0–40)
Albumin/Globulin Ratio: 2.3 — ABNORMAL HIGH (ref 1.2–2.2)
Albumin: 4.1 g/dL (ref 3.8–4.8)
Alkaline Phosphatase: 59 IU/L (ref 44–121)
BUN/Creatinine Ratio: 20 (ref 9–23)
BUN: 13 mg/dL (ref 6–24)
Bilirubin Total: <0.2 mg/dL (ref 0.0–1.2)
CO2: 22 mmol/L (ref 20–29)
Calcium: 8.8 mg/dL (ref 8.7–10.2)
Chloride: 103 mmol/L (ref 96–106)
Creatinine, Ser: 0.65 mg/dL (ref 0.57–1.00)
Globulin, Total: 1.8 g/dL (ref 1.5–4.5)
Glucose: 75 mg/dL (ref 65–99)
Potassium: 4.3 mmol/L (ref 3.5–5.2)
Sodium: 138 mmol/L (ref 134–144)
Total Protein: 5.9 g/dL — ABNORMAL LOW (ref 6.0–8.5)
eGFR: 109 mL/min/{1.73_m2} (ref >59)

## 2021-02-03 LAB — TSH: TSH: 0.659 u[IU]/mL (ref 0.450–4.500)

## 2021-02-03 LAB — LIPID PANEL
Chol/HDL Ratio: 3.5 ratio (ref 0.0–4.4)
Cholesterol, Total: 160 mg/dL (ref 100–199)
HDL: 46 mg/dL (ref >39)
LDL Chol Calc (NIH): 85 mg/dL (ref 0–99)
Triglycerides: 168 mg/dL — ABNORMAL HIGH (ref 0–149)
VLDL Cholesterol Cal: 29 mg/dL (ref 5–40)

## 2021-02-03 LAB — HIV ANTIBODY (ROUTINE TESTING W REFLEX): HIV Screen 4th Generation wRfx: NONREACTIVE

## 2021-02-03 NOTE — Progress Notes (Signed)
Hi Michol. It was a pleasure seeing you yesterday.  Your lab work looks good.  No concerns at this time.  We will continue to monitor in the future.  Please let me know if you have any questions.

## 2021-02-04 ENCOUNTER — Other Ambulatory Visit: Payer: Self-pay | Admitting: Nurse Practitioner

## 2021-02-04 MED ORDER — ESCITALOPRAM OXALATE 20 MG PO TABS: 20.0000 mg | ORAL_TABLET | Freq: Every day | ORAL | 0 refills | Status: DC

## 2021-02-04 NOTE — Telephone Encounter (Signed)
.  Medication Refill - Medication: Lexapro 10 mg 2 pills at one time  Has the patient contacted their pharmacy? Yes.  Pt said they ask her to call the office (Agent: If no, request that the patient contact the pharmacy for the refill.) (Agent: If yes, when and what did the pharmacy advise?)  Preferred Pharmacy (with phone number or street name): [facility]  [place]   Agent: Please be advised that RX refills may take up to 3 business days. We ask that you follow-up with your pharmacy.

## 2021-03-02 ENCOUNTER — Encounter: Payer: Self-pay | Admitting: Nurse Practitioner

## 2021-03-02 ENCOUNTER — Telehealth (INDEPENDENT_AMBULATORY_CARE_PROVIDER_SITE_OTHER): Payer: BC Managed Care – PPO | Admitting: Nurse Practitioner

## 2021-03-02 DIAGNOSIS — F418 Other specified anxiety disorders: Secondary | ICD-10-CM | POA: Diagnosis not present

## 2021-03-02 MED ORDER — ESCITALOPRAM OXALATE 20 MG PO TABS: 20.0000 mg | ORAL_TABLET | Freq: Every day | ORAL | 0 refills | Status: DC

## 2021-03-02 MED ORDER — OLANZAPINE 20 MG PO TBDP: 20.0000 mg | ORAL_TABLET | Freq: Every day | ORAL | 0 refills | Status: DC

## 2021-03-02 NOTE — Progress Notes (Addendum)
There were no vitals taken for this visit.   Subjective:    Patient ID: Cristina Finley, female    DOB: 02-10-1976, 45 y.o.   MRN: [id number]  HPI: Cristina Finley is a 45 y.o. female  Chief Complaint  Patient presents with   Anxiety   ANXIETY/DEPRESSION Patient states things aren't going too well.  She states she was driving to work today and had an anxiety attack.  She states she has been having them more frequently. Patient states she has been out of her Zyprexa.  She was previously seeing psychiatry and getting ECT treatments but states she hasn't been able to get a hold of the psychiatrist. Denies SI.  GAD 7 : Generalized Anxiety Score 03/02/2021  Nervous, Anxious, on Edge 3  Control/stop worrying 2  Worry too much - different things 2  Trouble relaxing 2  Restless 1  Easily annoyed or irritable 2  Afraid - awful might happen 0  Total GAD 7 Score 12  Anxiety Difficulty Somewhat difficult    Flowsheet Row Video Visit from 03/02/2021 in [facility]  PHQ-9 Total Score 13       Relevant past medical, surgical, family and social history reviewed and updated as indicated. Interim medical history since our last visit reviewed. Allergies and medications reviewed and updated.  Review of Systems  Psychiatric/Behavioral:  Positive for dysphoric mood. Negative for suicidal ideas. The patient is nervous/anxious.    Per HPI unless specifically indicated above     Objective:    There were no vitals taken for this visit.  Wt Readings from Last 3 Encounters:  02/02/21 134 lb (60.8 kg)  12/23/20 129 lb 9.6 oz (58.8 kg)  10/18/20 105 lb (47.6 kg)    Physical Exam Vitals and nursing note reviewed.  HENT:     Head: Normocephalic.     Right Ear: Hearing normal.     Left Ear: Hearing normal.     Nose: Nose normal.  Eyes:     Pupils: Pupils are equal, round, and reactive to light.  Pulmonary:     Effort: Pulmonary effort is normal. No respiratory distress.   Neurological:     Mental Status: She is alert.  Psychiatric:        Mood and Affect: Mood normal.        Behavior: Behavior normal.        Thought Content: Thought content normal.        Judgment: Judgment normal.    Results for orders placed or performed in visit on 02/04/21  HM PAP SMEAR  Result Value Ref Range   HM Pap smear see result scanned into chart       Assessment & Plan:   Problem List Items Addressed This Visit       Other   Depression with anxiety - Primary    Chronic. Not well controlled.  Patient has not been on her Zyprexa.  She is not able to get a hold of her Psychiatrist to follow up or get a refill.  Refill sent of Zyprexa today.  Refill sent of Lexapro.  Reached out to referral coordinator to help patient get in touch with Psychiatry to follow up.  Will follow up with patient in 1 month.        Relevant Medications   escitalopram (LEXAPRO) 20 MG tablet     Follow up plan: Return in about 1 month (around 04/01/2021) for Depression/Anxiety FU. This visit was completed via MyChart due to  the restrictions of the COVID-19 pandemic. All issues as above were discussed and addressed. Physical exam was done as above through visual confirmation on MyChart. If it was felt that the patient should be evaluated in the office, they were directed there. The patient verbally consented to this visit. Location of the patient: Home Location of the provider: Office Those involved with this call:  Provider: Larae Grooms, NP CMA: Wilhemena Durie, CMA Front Desk/Registration: Channing Mutters This encounter was conducted via video.  I spent 15 dedicated to the care of this patient on the date of this encounter to include previsit review of 20, face to face time with the patient, and post visit ordering of testing.

## 2021-03-03 NOTE — Assessment & Plan Note (Addendum)
Chronic. Not well controlled.  Patient has not been on her Zyprexa.  She is not able to get a hold of her Psychiatrist to follow up or get a refill.  Refill sent of Zyprexa today.  Refill sent of Lexapro.  Reached out to referral coordinator to help patient get in touch with Psychiatry to follow up.  Will follow up with patient in 1 month.

## 2021-04-06 ENCOUNTER — Ambulatory Visit (INDEPENDENT_AMBULATORY_CARE_PROVIDER_SITE_OTHER): Payer: BC Managed Care – PPO | Admitting: Nurse Practitioner

## 2021-04-06 ENCOUNTER — Other Ambulatory Visit: Payer: Self-pay

## 2021-04-06 ENCOUNTER — Encounter: Payer: Self-pay | Admitting: Nurse Practitioner

## 2021-04-06 VITALS — BP 129/82 | HR 81 | Ht 63.0 in | Wt 139.0 lb

## 2021-04-06 DIAGNOSIS — F333 Major depressive disorder, recurrent, severe with psychotic symptoms: Secondary | ICD-10-CM

## 2021-04-06 DIAGNOSIS — F418 Other specified anxiety disorders: Secondary | ICD-10-CM | POA: Diagnosis not present

## 2021-04-06 MED ORDER — OLANZAPINE 20 MG PO TBDP: 20.0000 mg | ORAL_TABLET | Freq: Every day | ORAL | 0 refills | Status: DC

## 2021-04-06 MED ORDER — ESCITALOPRAM OXALATE 20 MG PO TABS: 20.0000 mg | ORAL_TABLET | Freq: Every day | ORAL | 0 refills | Status: DC

## 2021-04-06 NOTE — Assessment & Plan Note (Signed)
Chronic. Not well controlled. Patient is taking her Zyprexa and Lexapro.  PHQ9 and GAD7 scores are still elevated.  Called Dr. Toni Amend office and made appt for patient to see him tomorrow at 1:20. Refills sent due to patient being out of medication.  Follow up in 2 months for reevaluation.

## 2021-04-06 NOTE — Progress Notes (Signed)
BP 129/82   Pulse 81   Ht 5\' 3"  (1.6 m)   Wt 139 lb (63 kg)   SpO2 96%   BMI 24.62 kg/m    Subjective:    Patient ID: Cristina Finley, female    DOB: Jan 15, 1976, 45 y.o.   MRN: [id number]  HPI: Cristina Finley is a 45 y.o. female  Chief Complaint  Patient presents with   Depression   Anxiety   ANXIETY/DEPRESSION Patient states she restarted the Zyprexa after last visit.  Patient states she feels like it is helping some.  She also restarted the lexapro.  She has not called to make an appointment with the psychiatrist.  Denies SI.   GAD 7 : Generalized Anxiety Score 04/06/2021 03/02/2021  Nervous, Anxious, on Edge 2 3  Control/stop worrying 2 2  Worry too much - different things 2 2  Trouble relaxing 2 2  Restless 2 1  Easily annoyed or irritable 2 2  Afraid - awful might happen 0 0  Total GAD 7 Score 12 12  Anxiety Difficulty Somewhat difficult Somewhat difficult    Flowsheet Row Office Visit from 04/06/2021 in [facility]  PHQ-9 Total Score 14       Relevant past medical, surgical, family and social history reviewed and updated as indicated. Interim medical history since our last visit reviewed. Allergies and medications reviewed and updated.  Review of Systems  Psychiatric/Behavioral:  Positive for dysphoric mood. Negative for suicidal ideas. The patient is nervous/anxious.    Per HPI unless specifically indicated above     Objective:    BP 129/82   Pulse 81   Ht 5\' 3"  (1.6 m)   Wt 139 lb (63 kg)   SpO2 96%   BMI 24.62 kg/m   Wt Readings from Last 3 Encounters:  04/06/21 139 lb (63 kg)  02/02/21 134 lb (60.8 kg)  12/23/20 129 lb 9.6 oz (58.8 kg)    Physical Exam Vitals and nursing note reviewed.  HENT:     Head: Normocephalic.     Right Ear: Hearing normal.     Left Ear: Hearing normal.     Nose: Nose normal.  Eyes:     Pupils: Pupils are equal, round, and reactive to light.  Pulmonary:     Effort: Pulmonary effort is normal. No  respiratory distress.  Neurological:     Mental Status: She is alert.  Psychiatric:        Mood and Affect: Mood normal.        Behavior: Behavior normal.        Thought Content: Thought content normal.        Judgment: Judgment normal.    Results for orders placed or performed in visit on 02/04/21  HM PAP SMEAR  Result Value Ref Range   HM Pap smear see result scanned into chart       Assessment & Plan:   Problem List Items Addressed This Visit       Other   Depression with anxiety   Relevant Medications   escitalopram (LEXAPRO) 20 MG tablet   MDD (major depressive disorder), recurrent, severe, with psychosis (HCC) - Primary    Chronic. Not well controlled. Patient is taking her Zyprexa and Lexapro.  PHQ9 and GAD7 scores are still elevated.  Called Dr. 12/25/20 office and made appt for patient to see him tomorrow at 1:20. Refills sent due to patient being out of medication.  Follow up in 2 months for reevaluation.  Relevant Medications   escitalopram (LEXAPRO) 20 MG tablet     Follow up plan: Return in about 2 months (around 06/06/2021) for Depression/Anxiety FU.

## 2021-04-07 ENCOUNTER — Telehealth (INDEPENDENT_AMBULATORY_CARE_PROVIDER_SITE_OTHER): Payer: BC Managed Care – PPO | Admitting: Psychiatry

## 2021-04-07 DIAGNOSIS — F332 Major depressive disorder, recurrent severe without psychotic features: Secondary | ICD-10-CM | POA: Diagnosis not present

## 2021-04-07 NOTE — Progress Notes (Signed)
Virtual Visit via Telephone Note  I connected with [name] on 04/07/21 at  1:20 PM EDT by telephone and verified that I am speaking with the correct person using two identifiers.  Location: Patient: Home Provider: Hospital   I discussed the limitations, risks, security and privacy concerns of performing an evaluation and management service by telephone and the availability of in person appointments. I also discussed with the patient that there may be a patient responsible charge related to this service. The patient expressed understanding and agreed to proceed.   History of Present Illness: Patient reached by telephone.  Patient was on time prepared and appropriate.  Established identities appropriately.  Patient reports that she has developed worsening depression over the last month.  Things have accelerated quite a bit for several weeks.  He now has almost no energy.  Spends all her time in bed.  Hardly getting up to do anything.  Sleeping chaotically.  Eating adequately.  She denies having any hallucinations or paranoia.  She denies having any suicidal thoughts.  Frequent crying spells.  Reviewed chart.  She complained of these symptoms to her primary care doctor as well who encouraged her to get back in touch with [place].  Patient has been taking her Lexapro and Zyprexa and says she has been consistently compliant.  Denies that she has been drinking much recently.  Patient is specifically asking to resume ECT treatment.    Observations/Objective: Patient sounds slow down and sad.  She is alert and oriented.  Clearly understands what we are discussing.  She denies any suicidal thoughts denies any psychotic symptoms.  Did not make any comments that sounded delusional or bizarre.   Assessment and Plan: Patient with a history of severe recurrent depression that has included psychosis in the past but currently seems to be so far without psychosis and is requesting a return of ECT treatment.  In  the past she had 3 treatments with ECT before self discontinuing the course back in June.  She seemed to have benefited from that.  I am glad to retreat her again there is no contraindication.  We discussed the need to have transportation to and from treatment.  Patient confirms that she does.  I am going to pass information along to the ECT team including utilization review and see if we can get her scheduled sometime as soon as possible possibly next week.   Follow Up Instructions: If depression gets worse to the point of threatening health and especially if any suicidal thoughts manifest please seek out professional help immediately.    I discussed the assessment and treatment plan with the patient. The patient was provided an opportunity to ask questions and all were answered. The patient agreed with the plan and demonstrated an understanding of the instructions.   The patient was advised to call back or seek an in-person evaluation if the symptoms worsen or if the condition fails to improve as anticipated.  I provided 20 minutes of non-face-to-face time during this encounter.   Mordecai Rasmussen, MD

## 2021-04-18 ENCOUNTER — Ambulatory Visit: Payer: Self-pay | Admitting: Certified Registered"

## 2021-04-18 ENCOUNTER — Encounter: Payer: Self-pay | Admitting: Certified Registered"

## 2021-04-18 ENCOUNTER — Other Ambulatory Visit: Payer: Self-pay

## 2021-04-18 ENCOUNTER — Encounter
Admission: RE | Admit: 2021-04-18 | Discharge: 2021-04-18 | Disposition: A | Discharge status: Home / Self Care | Payer: BC Managed Care – PPO | Source: Ambulatory Visit | Attending: Psychiatry | Admitting: Psychiatry

## 2021-04-18 ENCOUNTER — Other Ambulatory Visit: Payer: Self-pay | Admitting: Psychiatry

## 2021-04-18 DIAGNOSIS — F332 Major depressive disorder, recurrent severe without psychotic features: Secondary | ICD-10-CM | POA: Diagnosis not present

## 2021-04-18 DIAGNOSIS — F17210 Nicotine dependence, cigarettes, uncomplicated: Secondary | ICD-10-CM | POA: Diagnosis not present

## 2021-04-18 DIAGNOSIS — F339 Major depressive disorder, recurrent, unspecified: Secondary | ICD-10-CM | POA: Diagnosis not present

## 2021-04-18 DIAGNOSIS — F419 Anxiety disorder, unspecified: Secondary | ICD-10-CM | POA: Insufficient documentation

## 2021-04-18 MED ORDER — METHOHEXITAL SODIUM 100 MG/10ML IV SOSY
PREFILLED_SYRINGE | INTRAVENOUS | Status: DC | PRN
  Administered 2021-04-18: 70 mg via INTRAVENOUS

## 2021-04-18 MED ORDER — SUCCINYLCHOLINE CHLORIDE 200 MG/10ML IV SOSY
PREFILLED_SYRINGE | INTRAVENOUS | Status: DC | PRN
  Administered 2021-04-18: 70 mg via INTRAVENOUS

## 2021-04-18 NOTE — Progress Notes (Signed)
Upon arrival to pacu s/p ECT agitated, combative, pulling at equipment. With time patient calmed and able to converse and cooperate.

## 2021-04-18 NOTE — Anesthesia Postprocedure Evaluation (Signed)
Anesthesia Post Note  Patient: Cristina Finley  Procedure(s) Performed: ECT TX  Patient location during evaluation: PACU Anesthesia Type: General Level of consciousness: awake and oriented Vital Signs Assessment: post-procedure vital signs reviewed and stable Respiratory status: spontaneous breathing and respiratory function stable Cardiovascular status: blood pressure returned to baseline Anesthetic complications: no   No notable events documented.   Last Vitals:  Vitals:   04/18/21 0945 04/18/21 1400  BP: 128/76 (!) 150/87  Pulse: 65   Resp: 16   Temp: 36.4 C 36.6 C  SpO2: 99%     Last Pain:  Vitals:   04/18/21 1400  TempSrc:   PainSc: 0-No pain                 VAN STAVEREN,Brewster Wolters

## 2021-04-18 NOTE — Anesthesia Preprocedure Evaluation (Signed)
Anesthesia Evaluation  Patient identified by MRN, date of birth, ID band Patient awake    Reviewed: Allergy & Precautions, NPO status , Patient's Chart, lab work & pertinent test results  Airway Mallampati: II  TM Distance: >3 FB Neck ROM: full    Dental  (+) Teeth Intact   Pulmonary neg pulmonary ROS, Current SmokerPatient did not abstain from smoking.,    Pulmonary exam normal  + decreased breath sounds      Cardiovascular Exercise Tolerance: Good negative cardio ROS Normal cardiovascular exam Rhythm:Regular Rate:Normal     Neuro/Psych Anxiety Depression negative neurological ROS  negative psych ROS   GI/Hepatic negative GI ROS, Neg liver ROS,   Endo/Other  negative endocrine ROS  Renal/GU negative Renal ROS  negative genitourinary   Musculoskeletal   Abdominal Normal abdominal exam  (+)   Peds negative pediatric ROS (+)  Hematology negative hematology ROS (+)   Anesthesia Other Findings Past Medical History: No date: Depression  Past Surgical History: 2000: CHOLECYSTECTOMY  BMI    Body Mass Index: 24.45 kg/m      Reproductive/Obstetrics negative OB ROS                             Anesthesia Physical Anesthesia Plan  ASA: 2  Anesthesia Plan: General   Post-op Pain Management:    Induction: Intravenous  PONV Risk Score and Plan: Propofol infusion and TIVA  Airway Management Planned: Mask and Nasal Cannula  Additional Equipment:   Intra-op Plan:   Post-operative Plan:   Informed Consent: I have reviewed the patients History and Physical, chart, labs and discussed the procedure including the risks, benefits and alternatives for the proposed anesthesia with the patient or authorized representative who has indicated his/her understanding and acceptance.     Dental Advisory Given  Plan Discussed with: CRNA and Surgeon  Anesthesia Plan Comments:          Anesthesia Quick Evaluation

## 2021-04-18 NOTE — Transfer of Care (Signed)
Immediate Anesthesia Transfer of Care Note  Patient: Cristina Finley  Procedure(s) Performed: ECT TX  Patient Location: PACU  Anesthesia Type:General  Level of Consciousness: sedated  Airway & Oxygen Therapy: Patient Spontanous Breathing and Patient connected to face mask oxygen  Post-op Assessment: Report given to RN and Post -op Vital signs reviewed and stable  Post vital signs: Reviewed  Last Vitals:  Vitals Value Taken Time  BP    Temp    Pulse    Resp    SpO2      Last Pain:  Vitals:   04/18/21 1348  TempSrc:   PainSc: 0-No pain         Complications: No notable events documented.

## 2021-04-19 ENCOUNTER — Other Ambulatory Visit: Payer: Self-pay | Admitting: Psychiatry

## 2021-04-20 ENCOUNTER — Encounter (HOSPITAL_BASED_OUTPATIENT_CLINIC_OR_DEPARTMENT_OTHER)
Admission: RE | Admit: 2021-04-20 | Discharge: 2021-04-20 | Disposition: A | Discharge status: Home / Self Care | Payer: BC Managed Care – PPO | Source: Ambulatory Visit | Attending: Psychiatry | Admitting: Psychiatry

## 2021-04-20 ENCOUNTER — Encounter: Payer: Self-pay | Admitting: Registered Nurse

## 2021-04-20 ENCOUNTER — Other Ambulatory Visit: Payer: Self-pay

## 2021-04-20 ENCOUNTER — Ambulatory Visit: Payer: Self-pay | Admitting: Registered Nurse

## 2021-04-20 DIAGNOSIS — F332 Major depressive disorder, recurrent severe without psychotic features: Secondary | ICD-10-CM

## 2021-04-20 DIAGNOSIS — F339 Major depressive disorder, recurrent, unspecified: Secondary | ICD-10-CM | POA: Diagnosis not present

## 2021-04-20 DIAGNOSIS — F17210 Nicotine dependence, cigarettes, uncomplicated: Secondary | ICD-10-CM | POA: Diagnosis not present

## 2021-04-20 DIAGNOSIS — F333 Major depressive disorder, recurrent, severe with psychotic symptoms: Secondary | ICD-10-CM | POA: Diagnosis not present

## 2021-04-20 DIAGNOSIS — F419 Anxiety disorder, unspecified: Secondary | ICD-10-CM | POA: Diagnosis not present

## 2021-04-20 MED ORDER — ACETAMINOPHEN 325 MG PO TABS: 325.0000 mg | ORAL_TABLET | ORAL | Status: DC | PRN

## 2021-04-20 MED ORDER — MIDAZOLAM HCL 2 MG/2ML IJ SOLN
INTRAMUSCULAR | Status: AC
  Filled 2021-04-20: qty 2

## 2021-04-20 MED ORDER — GLYCOPYRROLATE 0.2 MG/ML IJ SOLN: 0.1000 mg | Freq: Once | INTRAMUSCULAR | Status: DC

## 2021-04-20 MED ORDER — SODIUM CHLORIDE 0.9 % IV SOLN
500.0000 mL | Freq: Once | INTRAVENOUS | Status: AC
  Administered 2021-04-20: 500 mL via INTRAVENOUS

## 2021-04-20 MED ORDER — SODIUM CHLORIDE 0.9 % IV SOLN: 500.0000 mL | Freq: Once | INTRAVENOUS | Status: DC

## 2021-04-20 MED ORDER — METHOHEXITAL SODIUM 0.5 G IJ SOLR
INTRAMUSCULAR | Status: AC
  Filled 2021-04-20: qty 500

## 2021-04-20 MED ORDER — GLYCOPYRROLATE 0.2 MG/ML IJ SOLN
INTRAMUSCULAR | Status: AC
  Filled 2021-04-20: qty 1

## 2021-04-20 MED ORDER — ACETAMINOPHEN 10 MG/ML IV SOLN: 1000.0000 mg | Freq: Once | INTRAVENOUS | Status: DC | PRN

## 2021-04-20 MED ORDER — LACTATED RINGERS IV SOLN: INTRAVENOUS | Status: DC

## 2021-04-20 MED ORDER — LABETALOL HCL 5 MG/ML IV SOLN
INTRAVENOUS | Status: AC
  Filled 2021-04-20: qty 4

## 2021-04-20 MED ORDER — MIDAZOLAM HCL 2 MG/2ML IJ SOLN
4.0000 mg | Freq: Once | INTRAMUSCULAR | Status: AC
  Administered 2021-04-20 (×2): 2 mg via INTRAVENOUS

## 2021-04-20 MED ORDER — PROMETHAZINE HCL 25 MG/ML IJ SOLN: 12.5000 mg | Freq: Once | INTRAMUSCULAR | Status: DC | PRN

## 2021-04-20 MED ORDER — GLYCOPYRROLATE 0.2 MG/ML IJ SOLN
0.1000 mg | Freq: Once | INTRAMUSCULAR | Status: AC
  Administered 2021-04-20: 0.1 mg via INTRAVENOUS

## 2021-04-20 MED ORDER — SODIUM CHLORIDE 0.9 % IV SOLN: INTRAVENOUS | Status: DC | PRN

## 2021-04-20 MED ORDER — ACETAMINOPHEN 160 MG/5ML PO SOLN
325.0000 mg | ORAL | Status: DC | PRN
  Filled 2021-04-20: qty 20.3

## 2021-04-20 MED ORDER — SUCCINYLCHOLINE CHLORIDE 200 MG/10ML IV SOSY
PREFILLED_SYRINGE | INTRAVENOUS | Status: AC
  Filled 2021-04-20: qty 10

## 2021-04-20 MED ORDER — SUCCINYLCHOLINE CHLORIDE 200 MG/10ML IV SOSY
PREFILLED_SYRINGE | INTRAVENOUS | Status: DC | PRN
  Administered 2021-04-20: 70 mg via INTRAVENOUS

## 2021-04-20 MED ORDER — METHOHEXITAL SODIUM 100 MG/10ML IV SOSY
PREFILLED_SYRINGE | INTRAVENOUS | Status: DC | PRN
Start: 2021-04-20 — End: 2021-04-20
  Administered 2021-04-20: 70 mg via INTRAVENOUS

## 2021-04-20 NOTE — Progress Notes (Signed)
Patient calm, oriented, cooperative.  VSS.  Assited out of bed to wheelchair.  Tolerating PO fluids.

## 2021-04-20 NOTE — Transfer of Care (Signed)
Immediate Anesthesia Transfer of Care Note  Patient: Cristina Finley  Procedure(s) Performed: ECT TX  Patient Location: PACU  Anesthesia Type:General  Level of Consciousness: drowsy  Airway & Oxygen Therapy: Patient Spontanous Breathing  Post-op Assessment: Report given to RN and Post -op Vital signs reviewed and stable  Post vital signs: Reviewed and stable  Last Vitals:  Vitals Value Taken Time  BP    Temp    Pulse    Resp    SpO2      Last Pain:  Vitals:   04/20/21 0949  TempSrc:   PainSc: 0-No pain         Complications: No notable events documented.

## 2021-04-20 NOTE — Anesthesia Postprocedure Evaluation (Signed)
Anesthesia Post Note  Patient: Cristina Finley  Procedure(s) Performed: ECT TX  Patient location during evaluation: PACU Anesthesia Type: General Level of consciousness: awake and alert Pain management: pain level controlled Vital Signs Assessment: post-procedure vital signs reviewed and stable Respiratory status: spontaneous breathing, nonlabored ventilation, respiratory function stable and patient connected to nasal cannula oxygen Cardiovascular status: blood pressure returned to baseline and stable Postop Assessment: no apparent nausea or vomiting Anesthetic complications: no   No notable events documented.   Last Vitals:  Vitals:   04/20/21 1356 04/20/21 1438  BP: 133/75 109/72  Pulse: 96 90  Resp: 18 18  Temp: 36.6 C   SpO2: 98% 97%    Last Pain:  Vitals:   04/20/21 1438  TempSrc:   PainSc: 0-No pain                 Dianna Deshler M Brianna Bennett

## 2021-04-20 NOTE — Progress Notes (Signed)
At 1405, patient awake, restless, tossing in bed, tearing at CM leads and pulse oximetry.  Attempting to get out of bed.  Verbalizing incoherently and refusing to follow commands.  2 at bedside for patient safety to keep from climbing out of bed.  Attempts made to re-orient.

## 2021-04-20 NOTE — H&P (Signed)
Cristina Finley is an 45 y.o. female.   Chief Complaint: Ongoing depression and anxiety HPI: History of recurrent severe depression  Past Medical History:  Diagnosis Date   Depression     Past Surgical History:  Procedure Laterality Date   CHOLECYSTECTOMY  2000    Family History  Problem Relation Age of Onset   Depression Mother    COPD Father    Depression Paternal Aunt    Heart disease Maternal Grandmother    Cancer Maternal Grandfather    Cancer Paternal Grandmother    Social History:  reports that she has been smoking cigarettes. She has been smoking an average of 1 pack per day. She has never used smokeless tobacco. She reports that she does not currently use alcohol after a past usage of about 6.0 standard drinks per week. She reports that she does not currently use drugs after having used the following drugs: Marijuana.  Allergies:  Allergies  Allergen Reactions   Amoxicillin Hives   Penicillins Hives and Nausea Only    (Not in a hospital admission)   No results found for this or any previous visit (from the past 48 hour(s)). No results found.  Review of Systems  Constitutional: Negative.   HENT: Negative.    Eyes: Negative.   Respiratory: Negative.    Cardiovascular: Negative.   Gastrointestinal: Negative.   Musculoskeletal: Negative.   Skin: Negative.   Neurological: Negative.   Psychiatric/Behavioral:  Positive for dysphoric mood.    Blood pressure 108/70, pulse 89, temperature 97.8 F (36.6 C), temperature source Tympanic, resp. rate 18, height 5\' 3"  (1.6 m), weight 62.6 kg, SpO2 97 %. Physical Exam Vitals and nursing note reviewed.  Constitutional:      Appearance: She is well-developed.  HENT:     Head: Normocephalic and atraumatic.  Eyes:     Conjunctiva/sclera: Conjunctivae normal.     Pupils: Pupils are equal, round, and reactive to light.  Cardiovascular:     Heart sounds: Normal heart sounds.  Pulmonary:     Effort: Pulmonary effort is  normal.  Abdominal:     Palpations: Abdomen is soft.  Musculoskeletal:        General: Normal range of motion.     Cervical back: Normal range of motion.  Skin:    General: Skin is warm and dry.  Neurological:     General: No focal deficit present.     Mental Status: She is alert.  Psychiatric:        Mood and Affect: Mood is depressed.     Assessment/Plan Continue with the beginning of this course of ECT  , MD 04/20/2021, 5:19 PM

## 2021-04-20 NOTE — Anesthesia Preprocedure Evaluation (Signed)
Anesthesia Evaluation  Patient identified by MRN, date of birth, ID band Patient awake    Reviewed: Allergy & Precautions, NPO status , Patient's Chart, lab work & pertinent test results  History of Anesthesia Complications Negative for: history of anesthetic complications  Airway Mallampati: II  TM Distance: >3 FB Neck ROM: full    Dental  (+) Teeth Intact   Pulmonary neg pulmonary ROS, Current SmokerPatient did not abstain from smoking.,    Pulmonary exam normal  + decreased breath sounds      Cardiovascular Exercise Tolerance: Good negative cardio ROS Normal cardiovascular exam Rhythm:Regular Rate:Normal     Neuro/Psych Anxiety Depression negative neurological ROS     GI/Hepatic negative GI ROS, Neg liver ROS,   Endo/Other  negative endocrine ROS  Renal/GU negative Renal ROS  negative genitourinary   Musculoskeletal negative musculoskeletal ROS (+)   Abdominal Normal abdominal exam  (+)   Peds negative pediatric ROS (+)  Hematology negative hematology ROS (+)   Anesthesia Other Findings      Reproductive/Obstetrics negative OB ROS                             Anesthesia Physical  Anesthesia Plan  ASA: 2  Anesthesia Plan: General   Post-op Pain Management:    Induction: Intravenous  PONV Risk Score and Plan: Propofol infusion and TIVA  Airway Management Planned: Mask and Nasal Cannula  Additional Equipment:   Intra-op Plan:   Post-operative Plan:   Informed Consent: I have reviewed the patients History and Physical, chart, labs and discussed the procedure including the risks, benefits and alternatives for the proposed anesthesia with the patient or authorized representative who has indicated his/her understanding and acceptance.     Dental Advisory Given  Plan Discussed with: CRNA and Surgeon  Anesthesia Plan Comments:         Anesthesia Quick  Evaluation

## 2021-04-20 NOTE — Procedures (Signed)
ECT SERVICES Physician's Interval Evaluation & Treatment Note  Patient Identification: Cristina Finley MRN:  [id number] Date of Evaluation:  04/20/2021 TX #: 2  MADRS:   MMSE:   P.E. Findings:  Unremarkable  Psychiatric Interval Note:  Still depressed down somewhat confused and anxious  Subjective:  Patient is a 45 y.o. female seen for evaluation for Electroconvulsive Therapy. Dysphoric no active suicidal intent  Treatment Summary:   [x]   Right Unilateral             []  Bilateral   % Energy : 0.3 ms 50%   Impedance: 1860 ohms  Seizure Energy Index: 6467 V squared  Postictal Suppression Index: Less than 10%  Seizure Concordance Index: 70%  Medications  Pre Shock: Robinul 0.1 mg Brevital 70 mg succinylcholine 70 mg  Post Shock: Versed 4 mg  Seizure Duration: 35 seconds EMG 106 seconds EEG   Comments: Less agitated after treatment but still will need more medicine next time  Lungs:  [x]   Clear to auscultation               []  Other:   Heart:    [x]   Regular rhythm             []  irregular rhythm    [x]   Previous H&P reviewed, patient examined and there are NO CHANGES                 []   Previous H&P reviewed, patient examined and there are changes noted.   , MD 11/9/20225:20 PM

## 2021-04-21 ENCOUNTER — Other Ambulatory Visit: Payer: Self-pay | Admitting: Psychiatry

## 2021-04-22 ENCOUNTER — Other Ambulatory Visit: Payer: Self-pay | Admitting: Psychiatry

## 2021-04-22 ENCOUNTER — Encounter: Payer: Self-pay | Admitting: Anesthesiology

## 2021-04-22 ENCOUNTER — Ambulatory Visit: Payer: Self-pay | Admitting: Anesthesiology

## 2021-04-22 ENCOUNTER — Encounter (HOSPITAL_BASED_OUTPATIENT_CLINIC_OR_DEPARTMENT_OTHER)
Admission: RE | Admit: 2021-04-22 | Discharge: 2021-04-22 | Disposition: A | Discharge status: Home / Self Care | Payer: BC Managed Care – PPO | Source: Ambulatory Visit | Attending: Psychiatry | Admitting: Psychiatry

## 2021-04-22 DIAGNOSIS — F17210 Nicotine dependence, cigarettes, uncomplicated: Secondary | ICD-10-CM | POA: Diagnosis not present

## 2021-04-22 DIAGNOSIS — F419 Anxiety disorder, unspecified: Secondary | ICD-10-CM | POA: Diagnosis not present

## 2021-04-22 DIAGNOSIS — F339 Major depressive disorder, recurrent, unspecified: Secondary | ICD-10-CM | POA: Diagnosis not present

## 2021-04-22 DIAGNOSIS — F333 Major depressive disorder, recurrent, severe with psychotic symptoms: Secondary | ICD-10-CM | POA: Diagnosis not present

## 2021-04-22 DIAGNOSIS — F332 Major depressive disorder, recurrent severe without psychotic features: Secondary | ICD-10-CM | POA: Diagnosis not present

## 2021-04-22 MED ORDER — GLYCOPYRROLATE 0.2 MG/ML IJ SOLN
0.2000 mg | Freq: Once | INTRAMUSCULAR | Status: AC
  Administered 2021-04-22: 0.2 mg via INTRAVENOUS

## 2021-04-22 MED ORDER — DEXTROSE 5 % IV SOLN: 500.0000 mL | Freq: Once | INTRAVENOUS | Status: DC

## 2021-04-22 MED ORDER — MIDAZOLAM HCL 2 MG/2ML IJ SOLN
INTRAMUSCULAR | Status: AC
  Filled 2021-04-22: qty 4

## 2021-04-22 MED ORDER — MIDAZOLAM HCL 2 MG/2ML IJ SOLN
INTRAMUSCULAR | Status: AC
  Filled 2021-04-22: qty 2

## 2021-04-22 MED ORDER — METHOHEXITAL SODIUM 100 MG/10ML IV SOSY
PREFILLED_SYRINGE | INTRAVENOUS | Status: DC | PRN
  Administered 2021-04-22: 70 mg via INTRAVENOUS

## 2021-04-22 MED ORDER — SODIUM CHLORIDE 0.9 % IV SOLN: INTRAVENOUS | Status: DC | PRN

## 2021-04-22 MED ORDER — SUCCINYLCHOLINE CHLORIDE 200 MG/10ML IV SOSY
PREFILLED_SYRINGE | INTRAVENOUS | Status: DC | PRN
  Administered 2021-04-22: 70 mg via INTRAVENOUS

## 2021-04-22 MED ORDER — MIDAZOLAM HCL 2 MG/2ML IJ SOLN
6.0000 mg | Freq: Once | INTRAMUSCULAR | Status: AC
  Administered 2021-04-22: 6 mg via INTRAVENOUS

## 2021-04-22 MED ORDER — GLYCOPYRROLATE 0.2 MG/ML IJ SOLN
INTRAMUSCULAR | Status: AC
  Filled 2021-04-22: qty 1

## 2021-04-22 MED ORDER — SUCCINYLCHOLINE CHLORIDE 200 MG/10ML IV SOSY
PREFILLED_SYRINGE | INTRAVENOUS | Status: AC
  Filled 2021-04-22: qty 10

## 2021-04-22 NOTE — Procedures (Signed)
ECT SERVICES Physician's Interval Evaluation & Treatment Note  Patient Identification: Cristina Finley MRN:  [id number] Date of Evaluation:  04/22/2021 TX #: 3  MADRS:   MMSE:   P.E. Findings:  No change physical exam  Psychiatric Interval Note:  More interactive and brighter upbeat affect  Subjective:  Patient is a 45 y.o. female seen for evaluation for Electroconvulsive Therapy. Patient acknowledges feeling less depressed  Treatment Summary:   [x]   Right Unilateral             []  Bilateral   % Energy : 0.3 ms 50%   Impedance: 2220 ohms  Seizure Energy Index: 16,778 V squared  Postictal Suppression Index: 82%  Seizure Concordance Index: 29% but I think that is artifactual.  Medications  Pre Shock: Robinul 0.1 mg Brevital 70 mg succinylcholine 70 mg  Post Shock: Versed 4 mg  Seizure Duration: 39 seconds EMG 70 seconds EEG   Comments: Follow-up on Monday  Lungs:  [x]   Clear to auscultation               []  Other:   Heart:    [x]   Regular rhythm             []  irregular rhythm    [x]   Previous H&P reviewed, patient examined and there are NO CHANGES                 []   Previous H&P reviewed, patient examined and there are changes noted.   , MD 11/11/20225:30 PM

## 2021-04-22 NOTE — H&P (Signed)
Cristina Finley is an 45 y.o. female.   Chief Complaint: Return of depression and anxiety and moodiness HPI: History of recurrent depression  Past Medical History:  Diagnosis Date   Depression     Past Surgical History:  Procedure Laterality Date   CHOLECYSTECTOMY  2000    Family History  Problem Relation Age of Onset   Depression Mother    COPD Father    Depression Paternal Aunt    Heart disease Maternal Grandmother    Cancer Maternal Grandfather    Cancer Paternal Grandmother    Social History:  reports that she has been smoking cigarettes. She has been smoking an average of 1 pack per day. She has never used smokeless tobacco. She reports that she does not currently use alcohol after a past usage of about 6.0 standard drinks per week. She reports that she does not currently use drugs after having used the following drugs: Marijuana.  Allergies:  Allergies  Allergen Reactions   Amoxicillin Hives   Penicillins Hives and Nausea Only    (Not in a hospital admission)   No results found for this or any previous visit (from the past 48 hour(s)). No results found.  Review of Systems  Constitutional: Negative.   HENT: Negative.    Eyes: Negative.   Respiratory: Negative.    Cardiovascular: Negative.   Gastrointestinal: Negative.   Musculoskeletal: Negative.   Skin: Negative.   Neurological: Negative.   Psychiatric/Behavioral:  Positive for dysphoric mood.    Blood pressure 125/82, pulse 85, temperature 98.3 F (36.8 C), temperature source Tympanic, resp. rate 20, height 5\' 3"  (1.6 m), weight 62.6 kg, SpO2 95 %. Physical Exam Vitals reviewed.  Constitutional:      Appearance: She is well-developed.  HENT:     Head: Normocephalic and atraumatic.  Eyes:     Conjunctiva/sclera: Conjunctivae normal.     Pupils: Pupils are equal, round, and reactive to light.  Cardiovascular:     Heart sounds: Normal heart sounds.  Pulmonary:     Effort: Pulmonary effort is normal.   Abdominal:     Palpations: Abdomen is soft.  Musculoskeletal:        General: Normal range of motion.     Cervical back: Normal range of motion.  Skin:    General: Skin is warm and dry.  Neurological:     General: No focal deficit present.     Mental Status: She is alert.  Psychiatric:        Mood and Affect: Mood is depressed.     Assessment/Plan Restart ECT course with 3 times a week treatment  , MD 04/22/2021, 5:34 PM

## 2021-04-22 NOTE — Anesthesia Preprocedure Evaluation (Signed)
Anesthesia Evaluation  Patient identified by MRN, date of birth, ID band Patient awake    Reviewed: Allergy & Precautions, NPO status , Patient's Chart, lab work & pertinent test results  Airway Mallampati: II  TM Distance: >3 FB Neck ROM: full    Dental  (+) Teeth Intact   Pulmonary neg pulmonary ROS, Current Smoker and Patient abstained from smoking.,    Pulmonary exam normal breath sounds clear to auscultation       Cardiovascular Exercise Tolerance: Good negative cardio ROS Normal cardiovascular exam Rhythm:Regular     Neuro/Psych Anxiety Depression negative neurological ROS  negative psych ROS   GI/Hepatic negative GI ROS, Neg liver ROS,   Endo/Other  negative endocrine ROS  Renal/GU negative Renal ROS  negative genitourinary   Musculoskeletal   Abdominal Normal abdominal exam  (+)   Peds negative pediatric ROS (+)  Hematology negative hematology ROS (+)   Anesthesia Other Findings Past Medical History: No date: Depression  Past Surgical History: 2000: CHOLECYSTECTOMY  BMI    Body Mass Index: 24.44 kg/m      Reproductive/Obstetrics negative OB ROS                             Anesthesia Physical Anesthesia Plan  ASA: 2  Anesthesia Plan: General   Post-op Pain Management:    Induction: Intravenous  PONV Risk Score and Plan: Propofol infusion and TIVA  Airway Management Planned: Mask and Nasal Cannula  Additional Equipment:   Intra-op Plan:   Post-operative Plan:   Informed Consent: I have reviewed the patients History and Physical, chart, labs and discussed the procedure including the risks, benefits and alternatives for the proposed anesthesia with the patient or authorized representative who has indicated his/her understanding and acceptance.     Dental Advisory Given  Plan Discussed with: CRNA and Surgeon  Anesthesia Plan Comments:          Anesthesia Quick Evaluation

## 2021-04-22 NOTE — H&P (Signed)
Cristina Finley is an 45 y.o. female.   Chief Complaint: return od depression HPI: recurrent severe depression  Past Medical History:  Diagnosis Date   Depression     Past Surgical History:  Procedure Laterality Date   CHOLECYSTECTOMY  2000    Family History  Problem Relation Age of Onset   Depression Mother    COPD Father    Depression Paternal Aunt    Heart disease Maternal Grandmother    Cancer Maternal Grandfather    Cancer Paternal Grandmother    Social History:  reports that she has been smoking cigarettes. She has been smoking an average of 1 pack per day. She has never used smokeless tobacco. She reports that she does not currently use alcohol after a past usage of about 6.0 standard drinks per week. She reports that she does not currently use drugs after having used the following drugs: Marijuana.  Allergies:  Allergies  Allergen Reactions   Amoxicillin Hives   Penicillins Hives and Nausea Only    (Not in a hospital admission)   No results found for this or any previous visit (from the past 48 hour(s)). No results found.  Review of Systems  Constitutional: Negative.   HENT: Negative.    Eyes: Negative.   Respiratory: Negative.    Cardiovascular: Negative.   Gastrointestinal: Negative.   Musculoskeletal: Negative.   Skin: Negative.   Neurological: Negative.   Psychiatric/Behavioral:  Positive for dysphoric mood.    Blood pressure 90/70, pulse 65, temperature 97.8 F (36.6 C), temperature source Tympanic, resp. rate 18, height 5\' 3"  (1.6 m), weight 62.6 kg, SpO2 98 %. Physical Exam Vitals and nursing note reviewed.  Constitutional:      Appearance: She is well-developed.  HENT:     Head: Normocephalic and atraumatic.  Eyes:     Conjunctiva/sclera: Conjunctivae normal.     Pupils: Pupils are equal, round, and reactive to light.  Cardiovascular:     Heart sounds: Normal heart sounds.  Pulmonary:     Effort: Pulmonary effort is normal.  Abdominal:      Palpations: Abdomen is soft.  Musculoskeletal:        General: Normal range of motion.     Cervical back: Normal range of motion.  Skin:    General: Skin is warm and dry.  Neurological:     General: No focal deficit present.     Mental Status: She is alert.  Psychiatric:        Attention and Perception: Attention normal.        Mood and Affect: Mood is depressed. Affect is blunt.        Speech: Speech is delayed.        Behavior: Behavior is slowed.     Assessment/Plan Continue into next week  , MD 04/22/2021, 11:16 AM

## 2021-04-22 NOTE — Transfer of Care (Signed)
Immediate Anesthesia Transfer of Care Note  Patient: Cristina Finley  Procedure(s) Performed: ECT TX  Patient Location: PACU  Anesthesia Type:General  Level of Consciousness: awake and drowsy  Airway & Oxygen Therapy: Patient Spontanous Breathing and Patient connected to face mask oxygen  Post-op Assessment: Report given to RN and Post -op Vital signs reviewed and stable  Post vital signs: Reviewed and stable  Last Vitals:  Vitals Value Taken Time  BP 145/78 04/22/21 1233  Temp 36.9 C 04/22/21 1234  Pulse 95 04/22/21 1237  Resp 23 04/22/21 1237  SpO2 98 % 04/22/21 1237  Vitals shown include unvalidated device data.  Last Pain:  Vitals:   04/22/21 1003  TempSrc:   PainSc: 0-No pain         Complications: No notable events documented.

## 2021-04-22 NOTE — Procedures (Signed)
ECT SERVICES Physician's Interval Evaluation & Treatment Note  Patient Identification: Cristina Finley MRN:  [id number] Date of Evaluation: 04/18/2021 TX #: 1  MADRS:   MMSE:   P.E. Findings:  Heart and lungs normal vitals unremarkable normal physical exam  Psychiatric Interval Note:  Patient is depressed down withdrawn no active suicidal ideation  Subjective:  Patient is a 45 y.o. female seen for evaluation for Electroconvulsive Therapy. Dysphoric and moody  Treatment Summary:   [x]   Right Unilateral             []  Bilateral   % Energy : 0.3 ms 50%   Impedance: 2300 ohms   Seizure Energy Index: 4032 V squared with  Postictal Suppression Index: 29%  Seizure Concordance Index: 95%  Medications  Pre Shock: Robinul 0.1 mg Brevital 70 mg succinylcholine 70 mg  Post Shock: Versed 2 mg  Seizure Duration: 51 seconds EMG 111 seconds EEG   Comments: Return for treatment 3 times a week schedule for now.  Lungs:  [x]   Clear to auscultation               []  Other:   Heart:    [x]   Regular rhythm             []  irregular rhythm    [x]   Previous H&P reviewed, patient examined and there are NO CHANGES                 []   Previous H&P reviewed, patient examined and there are changes noted.   , MD 04/18/2021 5:35 PM

## 2021-04-25 ENCOUNTER — Ambulatory Visit: Payer: Self-pay | Admitting: Anesthesiology

## 2021-04-25 ENCOUNTER — Encounter: Payer: Self-pay | Admitting: Anesthesiology

## 2021-04-25 ENCOUNTER — Ambulatory Visit
Admission: RE | Admit: 2021-04-25 | Discharge: 2021-04-25 | Disposition: A | Discharge status: Home / Self Care | Payer: BC Managed Care – PPO | Source: Ambulatory Visit | Attending: Psychiatry | Admitting: Psychiatry

## 2021-04-25 ENCOUNTER — Other Ambulatory Visit: Payer: Self-pay

## 2021-04-25 DIAGNOSIS — F332 Major depressive disorder, recurrent severe without psychotic features: Secondary | ICD-10-CM

## 2021-04-25 DIAGNOSIS — F32A Depression, unspecified: Secondary | ICD-10-CM | POA: Diagnosis not present

## 2021-04-25 DIAGNOSIS — R569 Unspecified convulsions: Secondary | ICD-10-CM | POA: Insufficient documentation

## 2021-04-25 DIAGNOSIS — F418 Other specified anxiety disorders: Secondary | ICD-10-CM | POA: Diagnosis not present

## 2021-04-25 DIAGNOSIS — F333 Major depressive disorder, recurrent, severe with psychotic symptoms: Secondary | ICD-10-CM | POA: Diagnosis not present

## 2021-04-25 LAB — POCT PREGNANCY, URINE: Preg Test, Ur: NEGATIVE

## 2021-04-25 MED ORDER — GLYCOPYRROLATE 0.2 MG/ML IJ SOLN
0.1000 mg | Freq: Once | INTRAMUSCULAR | Status: AC
  Administered 2021-04-25: 0.1 mg via INTRAVENOUS

## 2021-04-25 MED ORDER — SODIUM CHLORIDE 0.9 % IV SOLN: INTRAVENOUS | Status: DC | PRN

## 2021-04-25 MED ORDER — GLYCOPYRROLATE 0.2 MG/ML IJ SOLN
INTRAMUSCULAR | Status: AC
  Filled 2021-04-25: qty 1

## 2021-04-25 MED ORDER — KETOROLAC TROMETHAMINE 30 MG/ML IJ SOLN
INTRAMUSCULAR | Status: AC
  Filled 2021-04-25: qty 1

## 2021-04-25 MED ORDER — KETOROLAC TROMETHAMINE 30 MG/ML IJ SOLN
30.0000 mg | Freq: Once | INTRAMUSCULAR | Status: AC
  Administered 2021-04-25: 30 mg via INTRAVENOUS

## 2021-04-25 MED ORDER — METHOHEXITAL SODIUM 100 MG/10ML IV SOSY
PREFILLED_SYRINGE | INTRAVENOUS | Status: DC | PRN
  Administered 2021-04-25: 70 mg via INTRAVENOUS

## 2021-04-25 MED ORDER — SUCCINYLCHOLINE CHLORIDE 200 MG/10ML IV SOSY
PREFILLED_SYRINGE | INTRAVENOUS | Status: DC | PRN
  Administered 2021-04-25: 70 mg via INTRAVENOUS

## 2021-04-25 MED ORDER — MIDAZOLAM HCL 2 MG/2ML IJ SOLN
INTRAMUSCULAR | Status: AC
  Filled 2021-04-25: qty 6

## 2021-04-25 MED ORDER — SODIUM CHLORIDE 0.9 % IV SOLN
500.0000 mL | Freq: Once | INTRAVENOUS | Status: AC
  Administered 2021-04-25: 500 mL via INTRAVENOUS

## 2021-04-25 MED ORDER — MIDAZOLAM HCL 2 MG/2ML IJ SOLN
6.0000 mg | Freq: Once | INTRAMUSCULAR | Status: AC
  Administered 2021-04-25: 6 mg via INTRAVENOUS

## 2021-04-25 NOTE — Anesthesia Postprocedure Evaluation (Signed)
Anesthesia Post Note  Patient: Cristina Finley  Procedure(s) Performed: ECT TX  Patient location during evaluation: PACU Anesthesia Type: General Level of consciousness: awake and alert Pain management: pain level controlled Vital Signs Assessment: post-procedure vital signs reviewed and stable Respiratory status: spontaneous breathing, nonlabored ventilation, respiratory function stable and patient connected to nasal cannula oxygen Cardiovascular status: blood pressure returned to baseline and stable Postop Assessment: no apparent nausea or vomiting Anesthetic complications: no   No notable events documented.   Last Vitals:  Vitals:   04/25/21 1130 04/25/21 1140  BP: 104/83 110/68  Pulse: 88 85  Resp: (!) 21 18  Temp:  36.7 C  SpO2: 100% 99%    Last Pain:  Vitals:   04/25/21 1140  TempSrc:   PainSc: 0-No pain                 [name] Donovan Gatchel

## 2021-04-25 NOTE — H&P (Signed)
Cristina Finley is an 45 y.o. female.   Chief Complaint: mood improved HPI: recurrent severe depression  Past Medical History:  Diagnosis Date   Depression     Past Surgical History:  Procedure Laterality Date   CHOLECYSTECTOMY  2000    Family History  Problem Relation Age of Onset   Depression Mother    COPD Father    Depression Paternal Aunt    Heart disease Maternal Grandmother    Cancer Maternal Grandfather    Cancer Paternal Grandmother    Social History:  reports that she has been smoking cigarettes. She has been smoking an average of 1 pack per day. She has never used smokeless tobacco. She reports that she does not currently use alcohol after a past usage of about 6.0 standard drinks per week. She reports that she does not currently use drugs after having used the following drugs: Marijuana.  Allergies:  Allergies  Allergen Reactions   Amoxicillin Hives   Penicillins Hives and Nausea Only    (Not in a hospital admission)   Results for orders placed or performed during the hospital encounter of 04/25/21 (from the past 48 hour(s))  Pregnancy, urine POC     Status: None   Collection Time: 04/25/21  9:47 AM  Result Value Ref Range   Preg Test, Ur NEGATIVE NEGATIVE    Comment:        THE SENSITIVITY OF THIS METHODOLOGY IS >24 mIU/mL    No results found.  Review of Systems  Constitutional: Negative.   HENT: Negative.    Eyes: Negative.   Respiratory: Negative.    Cardiovascular: Negative.   Gastrointestinal: Negative.   Musculoskeletal: Negative.   Skin: Negative.   Neurological: Negative.   Psychiatric/Behavioral:  Positive for dysphoric mood. Negative for agitation, behavioral problems, confusion and decreased concentration.    Blood pressure 107/73, pulse 85, temperature 98.2 F (36.8 C), temperature source Tympanic, resp. rate 18, height 5\' 3"  (1.6 m), weight 62.6 kg, SpO2 98 %. Physical Exam Constitutional:      Appearance: She is well-developed.   HENT:     Head: Normocephalic and atraumatic.  Eyes:     Conjunctiva/sclera: Conjunctivae normal.     Pupils: Pupils are equal, round, and reactive to light.  Cardiovascular:     Heart sounds: Normal heart sounds.  Pulmonary:     Effort: Pulmonary effort is normal.  Abdominal:     Palpations: Abdomen is soft.  Musculoskeletal:        General: Normal range of motion.     Cervical back: Normal range of motion.  Skin:    General: Skin is warm and dry.  Neurological:     Mental Status: She is alert.  Psychiatric:        Attention and Perception: Attention normal.        Mood and Affect: Affect is blunt.        Speech: Speech is delayed.        Behavior: Behavior is cooperative.        Thought Content: Thought content normal.        Cognition and Memory: Memory is impaired.     Assessment/Plan Continue index course as tolerated  , MD 04/25/2021, 10:20 AM

## 2021-04-25 NOTE — Anesthesia Preprocedure Evaluation (Signed)
Anesthesia Evaluation  Patient identified by MRN, date of birth, ID band Patient awake    Reviewed: Allergy & Precautions, NPO status , Patient's Chart, lab work & pertinent test results  History of Anesthesia Complications Negative for: history of anesthetic complications  Airway Mallampati: III  TM Distance: >3 FB Neck ROM: Full    Dental  (+) Chipped   Pulmonary neg sleep apnea, neg COPD, neg recent URI, Current Smoker and Patient abstained from smoking.,    breath sounds clear to auscultation- rhonchi (-) wheezing      Cardiovascular Exercise Tolerance: Good (-) hypertension(-) CAD, (-) Past MI, (-) Cardiac Stents and (-) CABG  Rhythm:Regular Rate:Normal - Systolic murmurs and - Diastolic murmurs    Neuro/Psych neg Seizures PSYCHIATRIC DISORDERS Anxiety Depression negative neurological ROS     GI/Hepatic negative GI ROS, Neg liver ROS,   Endo/Other  negative endocrine ROSneg diabetes  Renal/GU negative Renal ROS     Musculoskeletal negative musculoskeletal ROS (+)   Abdominal (+) - obese,   Peds  Hematology negative hematology ROS (+)   Anesthesia Other Findings   Reproductive/Obstetrics                             Anesthesia Physical  Anesthesia Plan  ASA: II  Anesthesia Plan: General   Post-op Pain Management:    Induction: Intravenous  PONV Risk Score and Plan: 1 and Ondansetron  Airway Management Planned: Mask  Additional Equipment:   Intra-op Plan:   Post-operative Plan:   Informed Consent: I have reviewed the patients History and Physical, chart, labs and discussed the procedure including the risks, benefits and alternatives for the proposed anesthesia with the patient or authorized representative who has indicated his/her understanding and acceptance.     Dental advisory given  Plan Discussed with: CRNA and Anesthesiologist  Anesthesia Plan Comments:  (Patient consented for risks of anesthesia including but not limited to:  - adverse reactions to medications - risk of airway placement if required - damage to eyes, teeth, lips or other oral mucosa - nerve damage due to positioning  - sore throat or hoarseness - Damage to heart, brain, nerves, lungs, other parts of body or loss of life  Patient voiced understanding.)        Anesthesia Quick Evaluation

## 2021-04-25 NOTE — Procedures (Signed)
ECT SERVICES Physician's Interval Evaluation & Treatment Note  Patient Identification: Cristina Finley MRN:  [id number] Date of Evaluation:  04/25/2021 TX #: 4  MADRS:   MMSE:   P.E. Findings:  No change to physical exam  Psychiatric Interval Note:  Affect seems improved brighter better eye contact reporting feeling more upbeat  Subjective:  Patient is a 45 y.o. female seen for evaluation for Electroconvulsive Therapy. Energy level improved  Treatment Summary:   [x]   Right Unilateral             []  Bilateral   % Energy : 0.3 ms 50%   Impedance: 2210 ohms  Seizure Energy Index: 5022 V squared  Postictal Suppression Index: 57%  Seizure Concordance Index: 90%  Medications  Pre Shock: Robinul 0.1 mg Brevital 70 mg succinylcholine 70 mg  Post Shock: Versed 4 mg  Seizure Duration: 44 seconds EMG 69 seconds EEG   Comments: Continue through this week at least  Lungs:  [x]   Clear to auscultation               []  Other:   Heart:    [x]   Regular rhythm             []  irregular rhythm    [x]   Previous H&P reviewed, patient examined and there are NO CHANGES                 []   Previous H&P reviewed, patient examined and there are changes noted.   , MD 11/14/202210:28 PM

## 2021-04-25 NOTE — Transfer of Care (Signed)
Immediate Anesthesia Transfer of Care Note  Patient: Cristina Finley  Procedure(s) Performed: ECT TX  Patient Location: PACU  Anesthesia Type:General  Level of Consciousness: drowsy and patient cooperative  Airway & Oxygen Therapy: Patient Spontanous Breathing  Post-op Assessment: Report given to RN and Post -op Vital signs reviewed and stable  Post vital signs: Reviewed and stable  Last Vitals:  Vitals Value Taken Time  BP 104/64 04/25/21 1057  Temp    Pulse 78 04/25/21 1057  Resp 17 04/25/21 1057  SpO2 92 % 04/25/21 1057  Vitals shown include unvalidated device data.  Last Pain:  Vitals:   04/25/21 0954  TempSrc: Tympanic  PainSc: 0-No pain         Complications: No notable events documented.

## 2021-04-26 ENCOUNTER — Other Ambulatory Visit: Payer: Self-pay | Admitting: Psychiatry

## 2021-04-27 ENCOUNTER — Ambulatory Visit: Payer: Self-pay

## 2021-04-27 ENCOUNTER — Other Ambulatory Visit: Payer: Self-pay

## 2021-04-27 ENCOUNTER — Encounter (HOSPITAL_BASED_OUTPATIENT_CLINIC_OR_DEPARTMENT_OTHER)
Admission: RE | Admit: 2021-04-27 | Discharge: 2021-04-27 | Disposition: A | Discharge status: Home / Self Care | Payer: BC Managed Care – PPO | Source: Ambulatory Visit | Attending: Psychiatry | Admitting: Psychiatry

## 2021-04-27 DIAGNOSIS — F332 Major depressive disorder, recurrent severe without psychotic features: Secondary | ICD-10-CM

## 2021-04-27 DIAGNOSIS — F333 Major depressive disorder, recurrent, severe with psychotic symptoms: Secondary | ICD-10-CM | POA: Diagnosis not present

## 2021-04-27 DIAGNOSIS — F17210 Nicotine dependence, cigarettes, uncomplicated: Secondary | ICD-10-CM | POA: Diagnosis not present

## 2021-04-27 DIAGNOSIS — F339 Major depressive disorder, recurrent, unspecified: Secondary | ICD-10-CM | POA: Diagnosis not present

## 2021-04-27 DIAGNOSIS — F419 Anxiety disorder, unspecified: Secondary | ICD-10-CM | POA: Diagnosis not present

## 2021-04-27 MED ORDER — METHOHEXITAL SODIUM 0.5 G IJ SOLR
INTRAMUSCULAR | Status: AC
  Filled 2021-04-27: qty 500

## 2021-04-27 MED ORDER — GLYCOPYRROLATE 0.2 MG/ML IJ SOLN: 0.1000 mg | Freq: Once | INTRAMUSCULAR | Status: AC

## 2021-04-27 MED ORDER — SUCCINYLCHOLINE CHLORIDE 200 MG/10ML IV SOSY
PREFILLED_SYRINGE | INTRAVENOUS | Status: DC | PRN
  Administered 2021-04-27: 70 mg via INTRAVENOUS

## 2021-04-27 MED ORDER — SODIUM CHLORIDE 0.9 % IV SOLN
500.0000 mL | Freq: Once | INTRAVENOUS | Status: AC
  Administered 2021-04-27: 500 mL via INTRAVENOUS

## 2021-04-27 MED ORDER — METHOHEXITAL SODIUM 100 MG/10ML IV SOSY
PREFILLED_SYRINGE | INTRAVENOUS | Status: DC | PRN
  Administered 2021-04-27: 70 mg via INTRAVENOUS

## 2021-04-27 MED ORDER — GLYCOPYRROLATE 0.2 MG/ML IJ SOLN
INTRAMUSCULAR | Status: AC
  Administered 2021-04-27: 0.1 mg via INTRAVENOUS
  Filled 2021-04-27: qty 1

## 2021-04-27 MED ORDER — MIDAZOLAM HCL 2 MG/2ML IJ SOLN: 6.0000 mg | Freq: Once | INTRAMUSCULAR | Status: DC

## 2021-04-27 MED ORDER — MIDAZOLAM HCL 2 MG/2ML IJ SOLN
INTRAMUSCULAR | Status: AC
  Filled 2021-04-27: qty 6

## 2021-04-27 MED ORDER — MIDAZOLAM HCL 2 MG/2ML IJ SOLN
INTRAMUSCULAR | Status: DC | PRN
  Administered 2021-04-27: 6 mg via INTRAVENOUS

## 2021-04-27 NOTE — Anesthesia Preprocedure Evaluation (Signed)
Anesthesia Evaluation  Patient identified by MRN, date of birth, ID band Patient awake    Reviewed: Allergy & Precautions, NPO status , Patient's Chart, lab work & pertinent test results  Airway Mallampati: II  TM Distance: >3 FB Neck ROM: full    Dental no notable dental hx.    Pulmonary neg pulmonary ROS, Current Smoker,    Pulmonary exam normal        Cardiovascular negative cardio ROS Normal cardiovascular exam     Neuro/Psych negative neurological ROS  negative psych ROS   GI/Hepatic negative GI ROS, Neg liver ROS,   Endo/Other  negative endocrine ROS  Renal/GU negative Renal ROS  negative genitourinary   Musculoskeletal   Abdominal Normal abdominal exam  (+)   Peds  Hematology negative hematology ROS (+)   Anesthesia Other Findings Past Medical History: No date: Depression  Past Surgical History: 2000: CHOLECYSTECTOMY  BMI    Body Mass Index: 24.49 kg/m      Reproductive/Obstetrics negative OB ROS                             Anesthesia Physical Anesthesia Plan  ASA: 1  Anesthesia Plan: General   Post-op Pain Management:    Induction: Intravenous  PONV Risk Score and Plan: Propofol infusion and TIVA  Airway Management Planned: Natural Airway and Nasal Cannula  Additional Equipment:   Intra-op Plan:   Post-operative Plan:   Informed Consent: I have reviewed the patients History and Physical, chart, labs and discussed the procedure including the risks, benefits and alternatives for the proposed anesthesia with the patient or authorized representative who has indicated his/her understanding and acceptance.     Dental Advisory Given  Plan Discussed with: Anesthesiologist, CRNA and Surgeon  Anesthesia Plan Comments: (Patient consented for risks of anesthesia including but not limited to:  - adverse reactions to medications - risk of airway placement if  required - damage to eyes, teeth, lips or other oral mucosa - nerve damage due to positioning  - sore throat or hoarseness - Damage to heart, brain, nerves, lungs, other parts of body or loss of life  Patient voiced understanding.)        Anesthesia Quick Evaluation

## 2021-04-27 NOTE — Transfer of Care (Signed)
Immediate Anesthesia Transfer of Care Note  Patient: Cristina Finley  Procedure(s) Performed: * No procedures listed *  Patient Location: PACU  Anesthesia Type:General  Level of Consciousness: sedated  Airway & Oxygen Therapy: Patient Spontanous Breathing and Patient connected to face mask oxygen  Post-op Assessment: Report given to RN and Post -op Vital signs reviewed and stable  Post vital signs: Reviewed and stable  Last Vitals:  Vitals:   04/27/21 0940 04/27/21 1220  BP: 115/63 94/73  Pulse: 68 76  Resp: 18 (!) 35  Temp: 37 C 36.7 C  SpO2: 100% 100%    Complications: No apparent anesthesia complications

## 2021-04-27 NOTE — Procedures (Signed)
ECT SERVICES Physician's Interval Evaluation & Treatment Note  Patient Identification: Cristina Finley MRN:  [id number] Date of Evaluation:  04/27/2021 TX #: 5  MADRS:   MMSE:   P.E. Findings:  No change to physical exam  Psychiatric Interval Note:  Smiling more mood is more upbeat  Subjective:  Patient is a 45 y.o. female seen for evaluation for Electroconvulsive Therapy. Acknowledges improvement with memory problems however  Treatment Summary:   [x]   Right Unilateral             []  Bilateral   % Energy : 0.3 ms 50%   Impedance: 2490 ohms  Seizure Energy Index: 4011 V squared  Postictal Suppression Index: 44%  Seizure Concordance Index: 94%  Medications  Pre Shock: Robinul 0.1 mg Brevital 70 mg succinylcholine 70 mg  Post Shock: Versed 4 mg  Seizure Duration: 40 seconds EMG 73 seconds EEG   Comments: Follow-up Friday  Lungs:  [x]   Clear to auscultation               []  Other:   Heart:    [x]   Regular rhythm             []  irregular rhythm    [x]   Previous H&P reviewed, patient examined and there are NO CHANGES                 []   Previous H&P reviewed, patient examined and there are changes noted.   , MD 11/16/20226:32 PM

## 2021-04-27 NOTE — Anesthesia Postprocedure Evaluation (Signed)
Anesthesia Post Note  Patient: Cristina Finley  Procedure(s) Performed: ECT TX  Patient location during evaluation: PACU Anesthesia Type: General Level of consciousness: awake and alert Pain management: pain level controlled Vital Signs Assessment: post-procedure vital signs reviewed and stable Respiratory status: spontaneous breathing, nonlabored ventilation, respiratory function stable and patient connected to nasal cannula oxygen Cardiovascular status: blood pressure returned to baseline and stable Postop Assessment: no apparent nausea or vomiting Anesthetic complications: no   No notable events documented.   Last Vitals:  Vitals:   04/27/21 1245 04/27/21 1300  BP:  119/77  Pulse: (!) 50 (!) 26  Resp: 18 13  Temp:  (!) 36.2 C  SpO2: 97% 100%    Last Pain:  Vitals:   04/27/21 1231  TempSrc:   PainSc: 0-No pain                 Johny Blamer

## 2021-04-27 NOTE — H&P (Signed)
Sharnelle Cappelli is an 45 y.o. female.   Chief Complaint: Patient acknowledges her mood is improved HPI: Recurrent depression  Past Medical History:  Diagnosis Date   Depression     Past Surgical History:  Procedure Laterality Date   CHOLECYSTECTOMY  2000    Family History  Problem Relation Age of Onset   Depression Mother    COPD Father    Depression Paternal Aunt    Heart disease Maternal Grandmother    Cancer Maternal Grandfather    Cancer Paternal Grandmother    Social History:  reports that she has been smoking cigarettes. She has been smoking an average of 1 pack per day. She has never used smokeless tobacco. She reports that she does not currently use alcohol after a past usage of about 6.0 standard drinks per week. She reports that she does not currently use drugs after having used the following drugs: Marijuana.  Allergies:  Allergies  Allergen Reactions   Amoxicillin Hives   Penicillins Hives and Nausea Only    (Not in a hospital admission)   No results found for this or any previous visit (from the past 48 hour(s)). No results found.  Review of Systems  Constitutional: Negative.   HENT: Negative.    Eyes: Negative.   Respiratory: Negative.    Cardiovascular: Negative.   Gastrointestinal: Negative.   Musculoskeletal: Negative.   Skin: Negative.   Neurological: Negative.   Psychiatric/Behavioral: Negative.     Blood pressure 120/76, pulse (!) 20, temperature (!) 97.2 F (36.2 C), temperature source Tympanic, resp. rate 12, height 5\' 3"  (1.6 m), weight 62.7 kg, SpO2 100 %. Physical Exam Vitals and nursing note reviewed.  Constitutional:      Appearance: She is well-developed.  HENT:     Head: Normocephalic and atraumatic.  Eyes:     Conjunctiva/sclera: Conjunctivae normal.     Pupils: Pupils are equal, round, and reactive to light.  Cardiovascular:     Heart sounds: Normal heart sounds.  Pulmonary:     Effort: Pulmonary effort is normal.   Abdominal:     Palpations: Abdomen is soft.  Musculoskeletal:        General: Normal range of motion.     Cervical back: Normal range of motion.  Skin:    General: Skin is warm and dry.  Neurological:     General: No focal deficit present.     Mental Status: She is alert.  Psychiatric:        Mood and Affect: Mood normal.        Thought Content: Thought content normal.     Assessment/Plan Treatment today and would like to see her back at least on Friday as well  Tuesday, MD 04/27/2021, 6:31 PM

## 2021-04-28 ENCOUNTER — Other Ambulatory Visit: Payer: Self-pay | Admitting: Psychiatry

## 2021-04-29 ENCOUNTER — Inpatient Hospital Stay: Admission: RE | Admit: 2021-04-29 | Payer: BC Managed Care – PPO | Source: Ambulatory Visit

## 2021-04-29 ENCOUNTER — Encounter: Payer: Self-pay | Admitting: Anesthesiology

## 2021-05-02 NOTE — Anesthesia Postprocedure Evaluation (Signed)
Anesthesia Post Note  Patient: Cristina Finley  Procedure(s) Performed: ECT TX  Patient location during evaluation: PACU Anesthesia Type: General Level of consciousness: awake Pain management: pain level controlled Vital Signs Assessment: post-procedure vital signs reviewed and stable Respiratory status: spontaneous breathing and respiratory function stable Cardiovascular status: blood pressure returned to baseline Anesthetic complications: no   No notable events documented.   Last Vitals:  Vitals:   04/22/21 1254 04/22/21 1325  BP: 123/79 (!) 122/96  Pulse: 85 82  Resp: 16 20  Temp:    SpO2: 99% 98%    Last Pain:  Vitals:   04/22/21 1254  TempSrc:   PainSc: 0-No pain                 VAN STAVEREN,Vickie Melnik

## 2021-05-19 ENCOUNTER — Other Ambulatory Visit: Payer: Self-pay | Admitting: Nurse Practitioner

## 2021-05-19 MED ORDER — ESCITALOPRAM OXALATE 20 MG PO TABS: 20.0000 mg | ORAL_TABLET | Freq: Every day | ORAL | 0 refills | Status: DC

## 2021-05-19 NOTE — Telephone Encounter (Signed)
Requested medication (s) are due for refill today: Yes  Requested medication (s) are on the active medication list: Yes  Last refill:  1 month (Zyprexa), 6 months (Trazodone)  Future visit scheduled: Yes  Notes to clinic:  Unable to refill per protocol, cannot delegate (Zyprexa). Unable to refill per protocol, last refill by another provider.       Requested Prescriptions  Pending Prescriptions Disp Refills   OLANZapine zydis (ZYPREXA) 20 MG disintegrating tablet 30 tablet 0    Sig: Take 1 tablet (20 mg total) by mouth at bedtime.     Not Delegated - Psychiatry:  Antipsychotics - Second Generation (Atypical) - olanzapine Failed - 05/19/2021  2:49 PM      Failed - This refill cannot be delegated      Passed - ALT in normal range and within 360 days    ALT  Date Value Ref Range Status  02/02/2021 15 0 - 32 IU/L Final          Passed - AST in normal range and within 360 days    AST  Date Value Ref Range Status  02/02/2021 16 0 - 40 IU/L Final          Passed - Last BP in normal range    BP Readings from Last 1 Encounters:  04/06/21 129/82          Passed - Valid encounter within last 6 months    Recent Outpatient Visits           1 month ago MDD (major depressive disorder), recurrent, severe, with psychosis (HCC)   [facility] Larae Grooms, NP   2 months ago Depression with anxiety   [facility] Larae Grooms, NP   3 months ago Annual physical exam   [facility] Larae Grooms, NP   4 months ago MDD (major depressive disorder), recurrent, severe, with psychosis (HCC)   [facility] Larae Grooms, NP       Future Appointments             In 4 days Larae Grooms, NP [facility]   In 2 weeks Larae Grooms, NP [facility]   In 8 months Larae Grooms, NP [facility], [facility]             traZODone (DESYREL) 50 MG tablet 30 tablet 2     Sig: Take 1 tablet (50 mg total) by mouth at bedtime as needed for sleep.     Psychiatry: Antidepressants - Serotonin Modulator Passed - 05/19/2021  2:49 PM      Passed - Completed PHQ-2 or PHQ-9 in the last 360 days      Passed - Valid encounter within last 6 months    Recent Outpatient Visits           1 month ago MDD (major depressive disorder), recurrent, severe, with psychosis (HCC)   [facility] Larae Grooms, NP   2 months ago Depression with anxiety   [facility] Larae Grooms, NP   3 months ago Annual physical exam   [facility] Larae Grooms, NP   4 months ago MDD (major depressive disorder), recurrent, severe, with psychosis (HCC)   [facility] Larae Grooms, NP       Future Appointments             In 4 days Larae Grooms, NP [facility], [facility]   In 2 weeks [place], Clydie Braun,  NP [facility], [facility]   In 8 months Jon Billings, NP [facility]            Signed Prescriptions Disp Refills   escitalopram (LEXAPRO) 20 MG tablet 7 tablet 0    Sig: Take 1 tablet (20 mg total) by mouth at bedtime. OFFICE VISIT NEEDED FOR ADDITIONAL REFILLS     Psychiatry:  Antidepressants - SSRI Passed - 05/19/2021  2:49 PM      Passed - Completed PHQ-2 or PHQ-9 in the last 360 days      Passed - Valid encounter within last 6 months    Recent Outpatient Visits           1 month ago MDD (major depressive disorder), recurrent, severe, with psychosis ([place])   [place], Karen, NP   2 months ago Depression with anxiety   [place], NP   3 months ago Annual physical exam   [place], NP   4 months ago MDD (major depressive disorder), recurrent, severe, with psychosis ([place])   [place], Karen, NP       Future Appointments             In 4 days  Jon Billings, NP [facility], [place]   In 2 weeks Jon Billings, NP [facility], [place]   In 8 months Jon Billings, NP [facility], [place]

## 2021-05-19 NOTE — Telephone Encounter (Signed)
Will refill Lexapro x 7 days due to upcoming appointment and courtesy refill given.  Requested Prescriptions  Pending Prescriptions Disp Refills  . escitalopram (LEXAPRO) 20 MG tablet 7 tablet 0    Sig: Take 1 tablet (20 mg total) by mouth at bedtime. OFFICE VISIT NEEDED FOR ADDITIONAL REFILLS     Psychiatry:  Antidepressants - SSRI Passed - 05/19/2021  2:49 PM      Passed - Completed PHQ-2 or PHQ-9 in the last 360 days      Passed - Valid encounter within last 6 months    Recent Outpatient Visits          1 month ago MDD (major depressive disorder), recurrent, severe, with psychosis ([place])   [place], Karen, NP   2 months ago Depression with anxiety   [place], NP   3 months ago Annual physical exam   [place], NP   4 months ago MDD (major depressive disorder), recurrent, severe, with psychosis ([place])   [place] Jon Billings, NP      Future Appointments            In 4 days Jon Billings, NP [facility], [place]   In 2 weeks Jon Billings, NP [facility], [place]   In 8 months Jon Billings, NP [facility], [place]           . OLANZapine zydis (ZYPREXA) 20 MG disintegrating tablet 30 tablet 0    Sig: Take 1 tablet (20 mg total) by mouth at bedtime.     Not Delegated - Psychiatry:  Antipsychotics - Second Generation (Atypical) - olanzapine Failed - 05/19/2021  2:49 PM      Failed - This refill cannot be delegated      Passed - ALT in normal range and within 360 days    ALT  Date Value Ref Range Status  02/02/2021 15 0 - 32 IU/L Final         Passed - AST in normal range and within 360 days    AST  Date Value Ref Range Status  02/02/2021 16 0 - 40 IU/L Final         Passed - Last BP in normal range    BP Readings from Last 1 Encounters:  04/06/21 129/82         Passed - Valid encounter within last 6 months     Recent Outpatient Visits          1 month ago MDD (major depressive disorder), recurrent, severe, with psychosis ([place])   [place], Karen, NP   2 months ago Depression with anxiety   [facility] Jon Billings, NP   3 months ago Annual physical exam   [facility] Jon Billings, NP   4 months ago MDD (major depressive disorder), recurrent, severe, with psychosis ([place])   [place], Karen, NP      Future Appointments            In 4 days Jon Billings, NP [facility], [place]   In 2 weeks Jon Billings, NP [facility], [place]   In 8 months Jon Billings, NP [facility], [place]           . traZODone (DESYREL) 50 MG tablet 30 tablet 2    Sig: Take 1 tablet (50 mg total) by mouth at bedtime as needed for sleep.  Psychiatry: Antidepressants - Serotonin Modulator Passed - 05/19/2021  2:49 PM      Passed - Completed PHQ-2 or PHQ-9 in the last 360 days      Passed - Valid encounter within last 6 months    Recent Outpatient Visits          1 month ago MDD (major depressive disorder), recurrent, severe, with psychosis (HCC)   [facility] Larae Grooms, NP   2 months ago Depression with anxiety   [facility] Larae Grooms, NP   3 months ago Annual physical exam   [facility], [place] Larae Grooms, NP   4 months ago MDD (major depressive disorder), recurrent, severe, with psychosis (HCC)   [facility] Larae Grooms, NP      Future Appointments            In 4 days Larae Grooms, NP [facility], [place]   In 2 weeks Larae Grooms, NP [facility], [place]   In 8 months Larae Grooms, NP [facility], [place]

## 2021-05-19 NOTE — Telephone Encounter (Signed)
Medication Refill - Medication: Pt has been out of medication for 4 days. Pt stated she does not want to end up in the hospital for not having her medication would like request expedited.   OLANZapine zydis (ZYPREXA) 20 MG disintegrating tablet traZODone (DESYREL) 50 MG tablet escitalopram (LEXAPRO) 20 MG tablet  Has the patient contacted their pharmacy? No. (Agent: If no, request that the patient contact the pharmacy for the refill. If patient does not wish to contact the pharmacy document the reason why and proceed with request.) Pt needs to be seen to get refills.   Preferred Pharmacy (with phone number or street name):   [facility] [address] [place] - [address]  [address] [place] [address]  Phone: [phone] Fax: [phone]    Has the patient been seen for an appointment in the last year OR does the patient have an upcoming appointment? Yes.    Agent: Please be advised that RX re,fills may take up to 3 business days. We ask that you follow-up with your pharmacy.

## 2021-05-22 NOTE — Progress Notes (Signed)
BP 103/62   Pulse 76   Temp 97.8 F (36.6 C) (Oral)   Wt 139 lb 12.8 oz (63.4 kg)   SpO2 95%   BMI 24.76 kg/m    Subjective:    Patient ID: Cristina Finley, female    DOB: [birth year], 45 y.o.   MRN: [id number]  HPI: Cristina Finley is a 45 y.o. female  Chief Complaint  Patient presents with   Depression   ANXIETY/DEPRESSION Patient states she is doing a little bit better.  She has resumed her ECT treatment.  She did not get a refill of the Zyprexa from her psychiatrist.  She has been out of it for about a week. Denies concerns at visit today. Denies SI.  GAD 7 : Generalized Anxiety Score 05/23/2021 04/06/2021 03/02/2021  Nervous, Anxious, on Edge 1 2 3   Control/stop worrying 1 2 2   Worry too much - different things 1 2 2   Trouble relaxing 1 2 2   Restless 1 2 1   Easily annoyed or irritable 1 2 2   Afraid - awful might happen 0 0 0  Total GAD 7 Score 6 12 12   Anxiety Difficulty Not difficult at all Somewhat difficult Somewhat difficult    Flowsheet Row Office Visit from 05/23/2021 in [facility]  PHQ-9 Total Score 7       Relevant past medical, surgical, family and social history reviewed and updated as indicated. Interim medical history since our last visit reviewed. Allergies and medications reviewed and updated.  Review of Systems  Psychiatric/Behavioral:  Positive for dysphoric mood. Negative for suicidal ideas. The patient is nervous/anxious.    Per HPI unless specifically indicated above     Objective:    BP 103/62   Pulse 76   Temp 97.8 F (36.6 C) (Oral)   Wt 139 lb 12.8 oz (63.4 kg)   SpO2 95%   BMI 24.76 kg/m   Wt Readings from Last 3 Encounters:  05/23/21 139 lb 12.8 oz (63.4 kg)  04/06/21 139 lb (63 kg)  02/02/21 134 lb (60.8 kg)    Physical Exam Vitals and nursing note reviewed.  Constitutional:      General: She is not in acute distress.    Appearance: Normal appearance. She is normal weight. She is not ill-appearing,  toxic-appearing or diaphoretic.  HENT:     Head: Normocephalic.     Right Ear: External ear normal.     Left Ear: External ear normal.     Nose: Nose normal.     Mouth/Throat:     Mouth: Mucous membranes are moist.     Pharynx: Oropharynx is clear.  Eyes:     General:        Right eye: No discharge.        Left eye: No discharge.     Extraocular Movements: Extraocular movements intact.     Conjunctiva/sclera: Conjunctivae normal.     Pupils: Pupils are equal, round, and reactive to light.  Cardiovascular:     Rate and Rhythm: Normal rate and regular rhythm.     Heart sounds: No murmur heard. Pulmonary:     Effort: Pulmonary effort is normal. No respiratory distress.     Breath sounds: Normal breath sounds. No wheezing or rales.  Musculoskeletal:     Cervical back: Normal range of motion and neck supple.  Skin:    General: Skin is warm and dry.     Capillary Refill: Capillary refill takes less than 2 seconds.  Neurological:  General: No focal deficit present.     Mental Status: She is alert and oriented to person, place, and time. Mental status is at baseline.  Psychiatric:        Mood and Affect: Mood normal.        Behavior: Behavior normal.        Thought Content: Thought content normal.        Judgment: Judgment normal.    Results for orders placed or performed in visit on 02/04/21  HM PAP SMEAR  Result Value Ref Range   HM Pap smear see result scanned into chart       Assessment & Plan:   Problem List Items Addressed This Visit       Other   Depression with anxiety    Chronic. Improved from prior visit.  See's Dr. Weber Cooks for ECT therapy.  However, she hasn't been since November 16 and cancelled her appt on November 18. Discussed with patient that I will fill her medications if she continues to see Dr. Weber Cooks for ECT.  Refills sent today.  Will follow up in 3 months for reevaluation.      Relevant Medications   escitalopram (LEXAPRO) 20 MG tablet    traZODone (DESYREL) 50 MG tablet   MDD (major depressive disorder), recurrent, severe, with psychosis ([place]) - Primary    Chronic. Improved from prior visit.  See's Dr. Weber Cooks for ECT therapy.  However, she hasn't been since November 16 and cancelled her appt on November 18. Discussed with patient that I will fill her medications if she continues to see Dr. Weber Cooks for ECT.  Refills sent today.  Will follow up in 3 months for reevaluation.      Relevant Medications   escitalopram (LEXAPRO) 20 MG tablet   traZODone (DESYREL) 50 MG tablet     Follow up plan: Return in about 3 months (around 08/21/2021) for Depression/Anxiety FU.

## 2021-05-23 ENCOUNTER — Encounter: Payer: Self-pay | Admitting: Nurse Practitioner

## 2021-05-23 ENCOUNTER — Ambulatory Visit (INDEPENDENT_AMBULATORY_CARE_PROVIDER_SITE_OTHER): Payer: BC Managed Care – PPO | Admitting: Nurse Practitioner

## 2021-05-23 ENCOUNTER — Other Ambulatory Visit: Payer: Self-pay

## 2021-05-23 VITALS — BP 103/62 | HR 76 | Temp 97.8°F | Wt 139.8 lb

## 2021-05-23 DIAGNOSIS — F418 Other specified anxiety disorders: Secondary | ICD-10-CM | POA: Diagnosis not present

## 2021-05-23 DIAGNOSIS — F333 Major depressive disorder, recurrent, severe with psychotic symptoms: Secondary | ICD-10-CM

## 2021-05-23 MED ORDER — TRAZODONE HCL 50 MG PO TABS: 50.0000 mg | ORAL_TABLET | Freq: Every evening | ORAL | 1 refills | Status: DC | PRN

## 2021-05-23 MED ORDER — ESCITALOPRAM OXALATE 20 MG PO TABS: 20.0000 mg | ORAL_TABLET | Freq: Every day | ORAL | 1 refills | Status: DC

## 2021-05-23 MED ORDER — OLANZAPINE 20 MG PO TBDP: 20.0000 mg | ORAL_TABLET | Freq: Every day | ORAL | 1 refills | Status: DC

## 2021-05-23 NOTE — Assessment & Plan Note (Signed)
Chronic. Improved from prior visit.  See's Dr. Toni Amend for ECT therapy.  However, she hasn't been since November 16 and cancelled her appt on November 18. Discussed with patient that I will fill her medications if she continues to see Dr. Toni Amend for ECT.  Refills sent today.  Will follow up in 3 months for reevaluation.

## 2021-06-06 ENCOUNTER — Ambulatory Visit: Payer: BC Managed Care – PPO | Admitting: Nurse Practitioner

## 2021-06-07 ENCOUNTER — Ambulatory Visit: Payer: BC Managed Care – PPO | Admitting: Nurse Practitioner

## 2021-08-22 ENCOUNTER — Ambulatory Visit: Payer: BC Managed Care – PPO | Admitting: Nurse Practitioner

## 2021-08-22 NOTE — Progress Notes (Deleted)
? ?There were no vitals taken for this visit.  ? ?Subjective:  ? ? Patient ID: Cristina Finley, female    DOB: 01-06-1976, 46 y.o.   MRN: [id number] ? ?HPI: ?Cristina Finley is a 46 y.o. female ? ?No chief complaint on file. ? ?ANXIETY/DEPRESSION ?Patient states she is doing a little bit better.  She has resumed her ECT treatment.  She did not get a refill of the Zyprexa from her psychiatrist.  She has been out of it for about a week. Denies concerns at visit today. Denies SI.  ?GAD 7 : Generalized Anxiety Score 05/23/2021 04/06/2021 03/02/2021  ?Nervous, Anxious, on Edge 1 2 3   ?Control/stop worrying 1 2 2   ?Worry too much - different things 1 2 2   ?Trouble relaxing 1 2 2   ?Restless 1 2 1   ?Easily annoyed or irritable 1 2 2   ?Afraid - awful might happen 0 0 0  ?Total GAD 7 Score 6 12 12   ?Anxiety Difficulty Not difficult at all Somewhat difficult Somewhat difficult  ? ? ?Flowsheet Row Office Visit from 05/23/2021 in [facility]  ?PHQ-9 Total Score 7  ? ?  ? ? ?Relevant past medical, surgical, family and social history reviewed and updated as indicated. Interim medical history since our last visit reviewed. ?Allergies and medications reviewed and updated. ? ?Review of Systems  ?Psychiatric/Behavioral:  Positive for dysphoric mood. Negative for suicidal ideas. The patient is nervous/anxious.   ? ?Per HPI unless specifically indicated above ? ?   ?Objective:  ?  ?There were no vitals taken for this visit.  ?Wt Readings from Last 3 Encounters:  ?05/23/21 139 lb 12.8 oz (63.4 kg)  ?04/06/21 139 lb (63 kg)  ?02/02/21 134 lb (60.8 kg)  ?  ?Physical Exam ?Vitals and nursing note reviewed.  ?Constitutional:   ?   General: She is not in acute distress. ?   Appearance: Normal appearance. She is normal weight. She is not ill-appearing, toxic-appearing or diaphoretic.  ?HENT:  ?   Head: Normocephalic.  ?   Right Ear: External ear normal.  ?   Left Ear: External ear normal.  ?   Nose: Nose normal.  ?   Mouth/Throat:  ?    Mouth: Mucous membranes are moist.  ?   Pharynx: Oropharynx is clear.  ?Eyes:  ?   General:     ?   Right eye: No discharge.     ?   Left eye: No discharge.  ?   Extraocular Movements: Extraocular movements intact.  ?   Conjunctiva/sclera: Conjunctivae normal.  ?   Pupils: Pupils are equal, round, and reactive to light.  ?Cardiovascular:  ?   Rate and Rhythm: Normal rate and regular rhythm.  ?   Heart sounds: No murmur heard. ?Pulmonary:  ?   Effort: Pulmonary effort is normal. No respiratory distress.  ?   Breath sounds: Normal breath sounds. No wheezing or rales.  ?Musculoskeletal:  ?   Cervical back: Normal range of motion and neck supple.  ?Skin: ?   General: Skin is warm and dry.  ?   Capillary Refill: Capillary refill takes less than 2 seconds.  ?Neurological:  ?   General: No focal deficit present.  ?   Mental Status: She is alert and oriented to person, place, and time. Mental status is at baseline.  ?Psychiatric:     ?   Mood and Affect: Mood normal.     ?   Behavior: Behavior normal.     ?  Thought Content: Thought content normal.     ?   Judgment: Judgment normal.  ? ? ?Results for orders placed or performed in visit on 02/04/21  ?HM PAP SMEAR  ?Result Value Ref Range  ? HM Pap smear see result scanned into chart   ? ?   ?Assessment & Plan:  ? ?Problem List Items Addressed This Visit   ?None ?  ? ?Follow up plan: ?No follow-ups on file. ? ? ? ? ? ? ? ?

## 2021-12-16 ENCOUNTER — Encounter: Payer: Self-pay | Admitting: Nurse Practitioner

## 2021-12-16 ENCOUNTER — Telehealth (INDEPENDENT_AMBULATORY_CARE_PROVIDER_SITE_OTHER): Payer: BC Managed Care – PPO | Admitting: Nurse Practitioner

## 2021-12-16 DIAGNOSIS — F333 Major depressive disorder, recurrent, severe with psychotic symptoms: Secondary | ICD-10-CM

## 2021-12-16 MED ORDER — ESCITALOPRAM OXALATE 20 MG PO TABS: 20.0000 mg | ORAL_TABLET | Freq: Every day | ORAL | 0 refills | Status: DC

## 2021-12-16 MED ORDER — OLANZAPINE 20 MG PO TBDP
20.0000 mg | ORAL_TABLET | Freq: Every day | ORAL | 0 refills | Status: DC
Start: 2021-12-16 — End: 2022-01-16

## 2021-12-16 NOTE — Assessment & Plan Note (Signed)
Chronic. Improved from prior visit.  However, she has been out of her medications for the last 3 days.  Will give 60 day supply.  Patient missed 3 month appt.  Will need to keep appointment for August.  Labs will be ordered at tha tappt.  Refills sent today.  Will follow up in 1 months for reevaluation.  Call sooner if concerns arise.

## 2021-12-16 NOTE — Progress Notes (Signed)
LMP 12/01/2021    Subjective:    Patient ID: Cristina Finley, female    DOB: 1976/04/22, 46 y.o.   MRN: [id number]  HPI: Cristina Finley is a 46 y.o. female  Chief Complaint  Patient presents with   Medication Refill     For lexapro and olanzapine   ANXIETY/DEPRESSION Patient states she has been out of her medication for about 3 days.  States she can tell that she needs the medications.  She needs refills of her medications.  She has not been seeing Dr. Toni Amend.  Feels like she is doing really well without the ECT treatments.    . Denies concerns at visit today. Denies SI.     12/16/2021    1:13 PM 05/23/2021    8:24 AM 04/06/2021    3:32 PM 03/02/2021    4:17 PM  GAD 7 : Generalized Anxiety Score  Nervous, Anxious, on Edge 1 1 2 3   Control/stop worrying 0 1 2 2   Worry too much - different things 0 1 2 2   Trouble relaxing 0 1 2 2   Restless 0 1 2 1   Easily annoyed or irritable 1 1 2 2   Afraid - awful might happen 1 0 0 0  Total GAD 7 Score 3 6 12 12   Anxiety Difficulty Not difficult at all Not difficult at all Somewhat difficult Somewhat difficult    Flowsheet Row Video Visit from 12/16/2021 in [facility]  PHQ-9 Total Score 1       Relevant past medical, surgical, family and social history reviewed and updated as indicated. Interim medical history since our last visit reviewed. Allergies and medications reviewed and updated.  Review of Systems  Psychiatric/Behavioral:  Positive for dysphoric mood. Negative for suicidal ideas. The patient is nervous/anxious.     Per HPI unless specifically indicated above     Objective:    LMP 12/01/2021   Wt Readings from Last 3 Encounters:  05/23/21 139 lb 12.8 oz (63.4 kg)  04/06/21 139 lb (63 kg)  02/02/21 134 lb (60.8 kg)    Physical Exam Vitals and nursing note reviewed.  HENT:     Head: Normocephalic.     Right Ear: Hearing normal.     Left Ear: Hearing normal.     Nose: Nose normal.  Eyes:     Pupils:  Pupils are equal, round, and reactive to light.  Pulmonary:     Effort: Pulmonary effort is normal. No respiratory distress.  Neurological:     Mental Status: She is alert.  Psychiatric:        Mood and Affect: Mood normal.        Behavior: Behavior normal.        Thought Content: Thought content normal.        Judgment: Judgment normal.     Results for orders placed or performed in visit on 02/04/21  HM PAP SMEAR  Result Value Ref Range   HM Pap smear see result scanned into chart       Assessment & Plan:   Problem List Items Addressed This Visit       Other   MDD (major depressive disorder), recurrent, severe, with psychosis (HCC) - Primary    Chronic. Improved from prior visit.  However, she has been out of her medications for the last 3 days.  Will give 60 day supply.  Patient missed 3 month appt.  Will need to keep appointment for August.  Labs will be ordered at  tha tappt.  Refills sent today.  Will follow up in 1 months for reevaluation.  Call sooner if concerns arise.       Relevant Medications   escitalopram (LEXAPRO) 20 MG tablet     Follow up plan: Return for Already has follow up scheduled.   This visit was completed via MyChart due to the restrictions of the COVID-19 pandemic. All issues as above were discussed and addressed. Physical exam was done as above through visual confirmation on MyChart. If it was felt that the patient should be evaluated in the office, they were directed there. The patient verbally consented to this visit. Location of the patient: Home Location of the provider: Office Those involved with this call:  Provider: Larae Grooms, NP CMA: Anitra Lauth, CMA Front Desk/Registration: Servando Snare This encounter was conducted via Video.  I spent 20 dedicated to the care of this patient on the date of this encounter to include previsit review of symptoms, plan of care, and need for labs, face to face time with the patient, and post visit  ordering of testing.

## 2022-01-13 NOTE — Progress Notes (Signed)
BP (!) 155/83   Pulse 71   Temp 98.6 F (37 C) (Oral)   Wt 160 lb 4.8 oz (72.7 kg)   LMP 01/09/2022 (Approximate)   SpO2 98%   BMI 28.40 kg/m    Subjective:    Patient ID: Cristina Finley, female    DOB: 07-Jan-1976, 46 y.o.   MRN: [id number]  HPI: Cristina Finley is a 46 y.o. female  Chief Complaint  Patient presents with   Depression   ANXIETY/DEPRESSION Patient states her Zyprexa and Lexapro are working well for her.  Feels like these are good doses.  She would like to get her medications from here and no longer see the psychiatrist.   Denies SI.      01/16/2022   11:13 AM 12/16/2021    1:13 PM 05/23/2021    8:24 AM 04/06/2021    3:32 PM  GAD 7 : Generalized Anxiety Score  Nervous, Anxious, on Edge _0 Control/stop worrying 1 0 1 2  Worry too much - different things 1 0 1 2  Trouble relaxing 0 0 1 2  Restless 0 0 1 2  Easily annoyed or irritable _1 Afraid - awful might happen 0 1 0 0  Total GAD 7 Score _2 Anxiety Difficulty Not difficult at all Not difficult at all Not difficult at all Somewhat difficult    [place] Office Visit from 01/16/2022 in [place]  PHQ-9 Total Score 4       Relevant past medical, surgical, family and social history reviewed and updated as indicated. Interim medical history since our last visit reviewed. Allergies and medications reviewed and updated.  Review of Systems  Psychiatric/Behavioral:  Positive for dysphoric mood. Negative for suicidal ideas. The patient is nervous/anxious.     Per HPI unless specifically indicated above     Objective:    BP (!) 155/83   Pulse 71   Temp 98.6 F (37 C) (Oral)   Wt 160 lb 4.8 oz (72.7 kg)   LMP 01/09/2022 (Approximate)   SpO2 98%   BMI 28.40 kg/m   Wt Readings from Last 3 Encounters:  01/16/22 160 lb 4.8 oz (72.7 kg)  05/23/21 139 lb 12.8 oz (63.4 kg)  04/06/21 139 lb (63 kg)    Physical Exam Vitals and nursing note reviewed.  Constitutional:       General: She is not in acute distress.    Appearance: Normal appearance. She is normal weight. She is not ill-appearing, toxic-appearing or diaphoretic.  HENT:     Head: Normocephalic.     Right Ear: External ear normal.     Left Ear: External ear normal.     Nose: Nose normal.     Mouth/Throat:     Mouth: Mucous membranes are moist.     Pharynx: Oropharynx is clear.  Eyes:     General:        Right eye: No discharge.        Left eye: No discharge.     Extraocular Movements: Extraocular movements intact.     Conjunctiva/sclera: Conjunctivae normal.     Pupils: Pupils are equal, round, and reactive to light.  Cardiovascular:     Rate and Rhythm: Normal rate and regular rhythm.     Heart sounds: No murmur heard. Pulmonary:     Effort: Pulmonary effort is normal. No respiratory distress.     Breath sounds: Normal breath sounds. No wheezing  or rales.  Musculoskeletal:     Cervical back: Normal range of motion and neck supple.  Skin:    General: Skin is warm and dry.     Capillary Refill: Capillary refill takes less than 2 seconds.  Neurological:     General: No focal deficit present.     Mental Status: She is alert and oriented to person, place, and time. Mental status is at baseline.  Psychiatric:        Mood and Affect: Mood normal.        Behavior: Behavior normal.        Thought Content: Thought content normal.        Judgment: Judgment normal.     Results for orders placed or performed in visit on 02/04/21  HM PAP SMEAR  Result Value Ref Range   HM Pap smear see result scanned into chart       Assessment & Plan:   Problem List Items Addressed This Visit       Other   MDD (major depressive disorder), recurrent, severe, with psychosis ([place]) - Primary    Chronic.  Controlled.  Continue with current medication regimen of Zyprexa 94m and Lexapro 226mdaily.  Refills sent today.  Labs ordered today.  Return to clinic in 1 months for reevaluation.  Call sooner if  concerns arise.        Relevant Medications   escitalopram (LEXAPRO) 20 MG tablet   Other Relevant Orders   Comp Met (CMET)   CBC w/Diff   Other Visit Diagnoses     Elevated blood pressure reading       Elevated at visit today.  Does not check at home. Follow up in 1 month if still elevated can discuss medication options at that time.          Follow up plan: Return in about 1 month (around 02/16/2022) for Physical and Fasting labs.

## 2022-01-16 ENCOUNTER — Encounter: Payer: Self-pay | Admitting: Nurse Practitioner

## 2022-01-16 ENCOUNTER — Other Ambulatory Visit: Payer: Self-pay

## 2022-01-16 ENCOUNTER — Ambulatory Visit (INDEPENDENT_AMBULATORY_CARE_PROVIDER_SITE_OTHER): Payer: BC Managed Care – PPO | Admitting: Nurse Practitioner

## 2022-01-16 VITALS — BP 155/83 | HR 71 | Temp 98.6°F | Wt 160.3 lb

## 2022-01-16 DIAGNOSIS — R030 Elevated blood-pressure reading, without diagnosis of hypertension: Secondary | ICD-10-CM

## 2022-01-16 DIAGNOSIS — F333 Major depressive disorder, recurrent, severe with psychotic symptoms: Secondary | ICD-10-CM | POA: Diagnosis not present

## 2022-01-16 MED ORDER — OLANZAPINE 20 MG PO TBDP: 20.0000 mg | ORAL_TABLET | Freq: Every day | ORAL | 1 refills | Status: DC

## 2022-01-16 MED ORDER — ESCITALOPRAM OXALATE 20 MG PO TABS
20.0000 mg | ORAL_TABLET | Freq: Every day | ORAL | 1 refills | Status: DC
Start: 2022-01-16 — End: 2022-09-07

## 2022-01-16 NOTE — Assessment & Plan Note (Signed)
Chronic.  Controlled.  Continue with current medication regimen of Zyprexa 20mg  and Lexapro 20mg  daily.  Refills sent today.  Labs ordered today.  Return to clinic in 1 months for reevaluation.  Call sooner if concerns arise.

## 2022-01-17 LAB — CBC WITH DIFFERENTIAL/PLATELET
Basophils Absolute: 0 10*3/uL (ref 0.0–0.2)
Basos: 1 %
EOS (ABSOLUTE): 0.1 10*3/uL (ref 0.0–0.4)
Eos: 2 %
Hematocrit: 40.4 % (ref 34.0–46.6)
Hemoglobin: 13.5 g/dL (ref 11.1–15.9)
Immature Grans (Abs): 0 10*3/uL (ref 0.0–0.1)
Immature Granulocytes: 0 %
Lymphocytes Absolute: 2.6 10*3/uL (ref 0.7–3.1)
Lymphs: 43 %
MCH: 32.1 pg (ref 26.6–33.0)
MCHC: 33.4 g/dL (ref 31.5–35.7)
MCV: 96 fL (ref 79–97)
Monocytes Absolute: 0.6 10*3/uL (ref 0.1–0.9)
Monocytes: 10 %
Neutrophils Absolute: 2.7 10*3/uL (ref 1.4–7.0)
Neutrophils: 44 %
Platelets: 252 10*3/uL (ref 150–450)
RBC: 4.2 x10E6/uL (ref 3.77–5.28)
RDW: 14.2 % (ref 11.7–15.4)
WBC: 6 10*3/uL (ref 3.4–10.8)

## 2022-01-17 LAB — COMPREHENSIVE METABOLIC PANEL
ALT: 14 IU/L (ref 0–32)
AST: 19 IU/L (ref 0–40)
Albumin/Globulin Ratio: 2 (ref 1.2–2.2)
Albumin: 4.3 g/dL (ref 3.9–4.9)
Alkaline Phosphatase: 58 IU/L (ref 44–121)
BUN/Creatinine Ratio: 16 (ref 9–23)
BUN: 13 mg/dL (ref 6–24)
Bilirubin Total: 0.2 mg/dL (ref 0.0–1.2)
CO2: 23 mmol/L (ref 20–29)
Calcium: 9.8 mg/dL (ref 8.7–10.2)
Chloride: 100 mmol/L (ref 96–106)
Creatinine, Ser: 0.82 mg/dL (ref 0.57–1.00)
Globulin, Total: 2.2 g/dL (ref 1.5–4.5)
Glucose: 90 mg/dL (ref 70–99)
Potassium: 4.6 mmol/L (ref 3.5–5.2)
Sodium: 137 mmol/L (ref 134–144)
Total Protein: 6.5 g/dL (ref 6.0–8.5)
eGFR: 88 mL/min/{1.73_m2} (ref >59)

## 2022-01-17 NOTE — Progress Notes (Signed)
HI [place]. It was nice to see you yesterday.  Your lab work looks good.  No concerns at this time. Continue with your current medication regimen.  Follow up as discussed.  Please let me know if you have any questions.

## 2022-02-03 ENCOUNTER — Encounter: Payer: BC Managed Care – PPO | Admitting: Nurse Practitioner

## 2022-02-20 NOTE — Progress Notes (Deleted)
There were no vitals taken for this visit.   Subjective:    Patient ID: Cristina Finley, female    DOB: 06/14/1975, 46 y.o.   MRN: [id number]  HPI: Cristina Finley is a 46 y.o. female presenting on 02/21/2022 for comprehensive medical examination. Current medical complaints include:{Blank single:19197::"none","***"}  She currently lives with: Menopausal Symptoms: {Blank single:19197::"yes","no"}  MOOD   Depression Screen done today and results listed below:     01/16/2022   11:13 AM 12/16/2021    1:11 PM 05/23/2021    8:23 AM 04/06/2021    3:32 PM 03/02/2021    4:15 PM  Depression screen PHQ 2/9  Decreased Interest 0 0 2 2 3   Down, Depressed, Hopeless 1 0 2 2 3   PHQ - 2 Score 1 0 4 4 6   Altered sleeping 0 0 1 2 0  Tired, decreased energy 1 0 1 2 2   Change in appetite 1 1 0 1 0  Feeling bad or failure about yourself  0 0 0 1 2  Trouble concentrating 1 0 1 2 3   Moving slowly or fidgety/restless 0 0 0 2 0  Suicidal thoughts 0 0 0 0 0  PHQ-9 Score 4 1 7 14 13   Difficult doing work/chores Not difficult at all Not difficult at all Somewhat difficult Not difficult at all Not difficult at all    The patient {has/does not have:19849} a history of falls. I {did/did not:19850} complete a risk assessment for falls. A plan of care for falls {was/was not:19852} documented.   Past Medical History:  Past Medical History:  Diagnosis Date   Depression     Surgical History:  Past Surgical History:  Procedure Laterality Date   CHOLECYSTECTOMY  2000    Medications:  Current Outpatient Medications on File Prior to Visit  Medication Sig   escitalopram (LEXAPRO) 20 MG tablet Take 1 tablet (20 mg total) by mouth at bedtime. OFFICE VISIT NEEDED FOR ADDITIONAL REFILLS   OLANZapine zydis (ZYPREXA) 20 MG disintegrating tablet Take 1 tablet (20 mg total) by mouth at bedtime.   No current facility-administered medications on file prior to visit.    Allergies:  Allergies  Allergen Reactions    Amoxicillin Hives   Penicillins Hives and Nausea Only    Social History:  Social History   Socioeconomic History   Marital status: Married    Spouse name: Not on file   Number of children: Not on file   Years of education: Not on file   Highest education level: Not on file  Occupational History   Not on file  Tobacco Use   Smoking status: Every Day    Packs/day: 1.00    Types: Cigarettes   Smokeless tobacco: Never  Vaping Use   Vaping Use: Never used  Substance and Sexual Activity   Alcohol use: Not Currently    Alcohol/week: 6.0 standard drinks of alcohol    Types: 6 Cans of beer per week    Comment: one 6 pack daily   Drug use: Not Currently    Types: Marijuana   Sexual activity: Yes    Partners: Male    Birth control/protection: Surgical  Other Topics Concern   Not on file  Social History Narrative   Not on file   Social Determinants of Health   Financial Resource Strain: Not on file  Food Insecurity: Not on file  Transportation Needs: Not on file  Physical Activity: Not on file  Stress: Not on file  Social Connections:  Not on file  Intimate Partner Violence: Not on file   Social History   Tobacco Use  Smoking Status Every Day   Packs/day: 1.00   Types: Cigarettes  Smokeless Tobacco Never   Social History   Substance and Sexual Activity  Alcohol Use Not Currently   Alcohol/week: 6.0 standard drinks of alcohol   Types: 6 Cans of beer per week   Comment: one 6 pack daily    Family History:  Family History  Problem Relation Age of Onset   Depression Mother    COPD Father    Depression Paternal Aunt    Heart disease Maternal Grandmother    Cancer Maternal Grandfather    Cancer Paternal Grandmother     Past medical history, surgical history, medications, allergies, family history and social history reviewed with patient today and changes made to appropriate areas of the chart.   ROS All other ROS negative except what is listed above and  in the HPI.      Objective:    There were no vitals taken for this visit.  Wt Readings from Last 3 Encounters:  01/16/22 160 lb 4.8 oz (72.7 kg)  05/23/21 139 lb 12.8 oz (63.4 kg)  04/06/21 139 lb (63 kg)    Physical Exam  Results for orders placed or performed in visit on 01/16/22  Comp Met (CMET)  Result Value Ref Range   Glucose 90 70 - 99 mg/dL   BUN 13 6 - 24 mg/dL   Creatinine, Ser 0.82 0.57 - 1.00 mg/dL   eGFR 88 >59 mL/min/1.73   BUN/Creatinine Ratio 16 9 - 23   Sodium 137 134 - 144 mmol/L   Potassium 4.6 3.5 - 5.2 mmol/L   Chloride 100 96 - 106 mmol/L   CO2 23 20 - 29 mmol/L   Calcium 9.8 8.7 - 10.2 mg/dL   Total Protein 6.5 6.0 - 8.5 g/dL   Albumin 4.3 3.9 - 4.9 g/dL   Globulin, Total 2.2 1.5 - 4.5 g/dL   Albumin/Globulin Ratio 2.0 1.2 - 2.2   Bilirubin Total 0.2 0.0 - 1.2 mg/dL   Alkaline Phosphatase 58 44 - 121 IU/L   AST 19 0 - 40 IU/L   ALT 14 0 - 32 IU/L  CBC w/Diff  Result Value Ref Range   WBC 6.0 3.4 - 10.8 x10E3/uL   RBC 4.20 3.77 - 5.28 x10E6/uL   Hemoglobin 13.5 11.1 - 15.9 g/dL   Hematocrit 40.4 34.0 - 46.6 %   MCV 96 79 - 97 fL   MCH 32.1 26.6 - 33.0 pg   MCHC 33.4 31.5 - 35.7 g/dL   RDW 14.2 11.7 - 15.4 %   Platelets 252 150 - 450 x10E3/uL   Neutrophils 44 Not Estab. %   Lymphs 43 Not Estab. %   Monocytes 10 Not Estab. %   Eos 2 Not Estab. %   Basos 1 Not Estab. %   Neutrophils Absolute 2.7 1.4 - 7.0 x10E3/uL   Lymphocytes Absolute 2.6 0.7 - 3.1 x10E3/uL   Monocytes Absolute 0.6 0.1 - 0.9 x10E3/uL   EOS (ABSOLUTE) 0.1 0.0 - 0.4 x10E3/uL   Basophils Absolute 0.0 0.0 - 0.2 x10E3/uL   Immature Granulocytes 0 Not Estab. %   Immature Grans (Abs) 0.0 0.0 - 0.1 x10E3/uL      Assessment & Plan:   Problem List Items Addressed This Visit       Other   Depression with anxiety   Nicotine abuse   MDD (major  depressive disorder), recurrent, severe, with psychosis ([place]) - Primary     Follow up plan: No follow-ups on file.   LABORATORY  TESTING:  - Pap smear: {Blank HFWYOV:78588::"FOY done","not applicable","up to date","done elsewhere"}  IMMUNIZATIONS:   - Tdap: Tetanus vaccination status reviewed: {tetanus status:315746}. - Influenza: {Blank single:19197::"Up to date","Administered today","Postponed to flu season","Refused","Given elsewhere"} - Pneumovax: {Blank single:19197::"Up to date","Administered today","Not applicable","Refused","Given elsewhere"} - Prevnar: {Blank single:19197::"Up to date","Administered today","Not applicable","Refused","Given elsewhere"} - COVID: {Blank single:19197::"Up to date","Administered today","Not applicable","Refused","Given elsewhere"} - HPV: {Blank single:19197::"Up to date","Administered today","Not applicable","Refused","Given elsewhere"} - Shingrix vaccine: {Blank single:19197::"Up to date","Administered today","Not applicable","Refused","Given elsewhere"}  SCREENING: -Mammogram: {Blank single:19197::"Up to date","Ordered today","Not applicable","Refused","Done elsewhere"}  - Colonoscopy: {Blank single:19197::"Up to date","Ordered today","Not applicable","Refused","Done elsewhere"}  - Bone Density: {Blank single:19197::"Up to date","Ordered today","Not applicable","Refused","Done elsewhere"}  -Hearing Test: {Blank single:19197::"Up to date","Ordered today","Not applicable","Refused","Done elsewhere"}  -Spirometry: {Blank single:19197::"Up to date","Ordered today","Not applicable","Refused","Done elsewhere"}   PATIENT COUNSELING:   Advised to take 1 mg of folate supplement per day if capable of pregnancy.   Sexuality: Discussed sexually transmitted diseases, partner selection, use of condoms, avoidance of unintended pregnancy  and contraceptive alternatives.   Advised to avoid cigarette smoking.  I discussed with the patient that most people either abstain from alcohol or drink within safe limits (<=14/week and <=4 drinks/occasion for males, <=7/weeks and <= 3 drinks/occasion for  females) and that the risk for alcohol disorders and other health effects rises proportionally with the number of drinks per week and how often a drinker exceeds daily limits.  Discussed cessation/primary prevention of drug use and availability of treatment for abuse.   Diet: Encouraged to adjust caloric intake to maintain  or achieve ideal body weight, to reduce intake of dietary saturated fat and total fat, to limit sodium intake by avoiding high sodium foods and not adding table salt, and to maintain adequate dietary potassium and calcium preferably from fresh fruits, vegetables, and low-fat dairy products.    stressed the importance of regular exercise  Injury prevention: Discussed safety belts, safety helmets, smoke detector, smoking near bedding or upholstery.   Dental health: Discussed importance of regular tooth brushing, flossing, and dental visits.    NEXT PREVENTATIVE PHYSICAL DUE IN 1 YEAR. No follow-ups on file.

## 2022-02-21 ENCOUNTER — Encounter: Payer: BC Managed Care – PPO | Admitting: Nurse Practitioner

## 2022-02-21 DIAGNOSIS — F418 Other specified anxiety disorders: Secondary | ICD-10-CM

## 2022-02-21 DIAGNOSIS — Z0000 Encounter for general adult medical examination without abnormal findings: Secondary | ICD-10-CM

## 2022-02-21 DIAGNOSIS — Z720 Tobacco use: Secondary | ICD-10-CM

## 2022-02-21 DIAGNOSIS — Z136 Encounter for screening for cardiovascular disorders: Secondary | ICD-10-CM

## 2022-02-21 DIAGNOSIS — F333 Major depressive disorder, recurrent, severe with psychotic symptoms: Secondary | ICD-10-CM

## 2022-05-06 IMAGING — CT CT HEAD W/O CM
3 series · 16 of 45 positions shown, 19 images · non-contrast
Comparison: None.

CLINICAL DATA: Psychosis.

EXAM:
CT HEAD WITHOUT CONTRAST
TECHNIQUE: Contiguous axial images were obtained from the base of the skull
through the vertex without intravenous contrast.

[Series 2: head wo · axial · 0.39mm/px · z∈[-66,+49]mm · 10 of 28 slices shown, 13 images]
[im 3/28  brain]
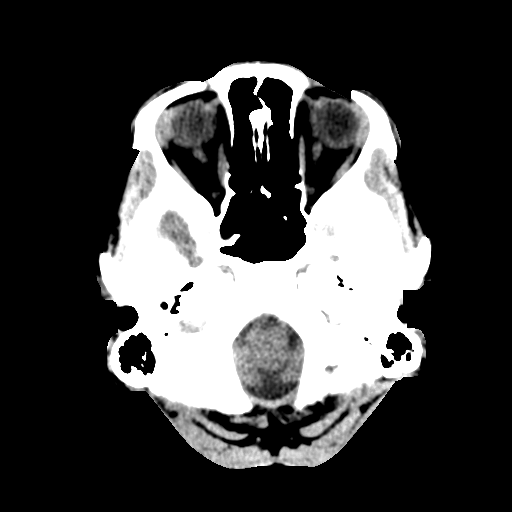
[im 3/28  bone]
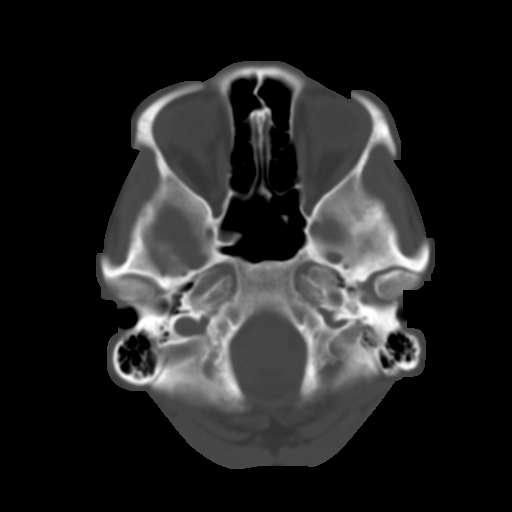
[im 5/28  brain]
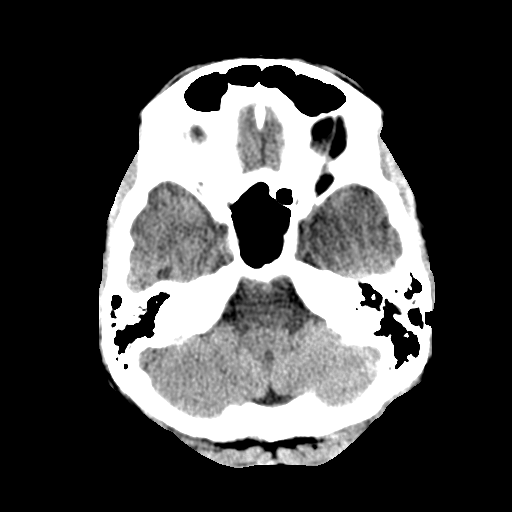
[im 8/28  brain]
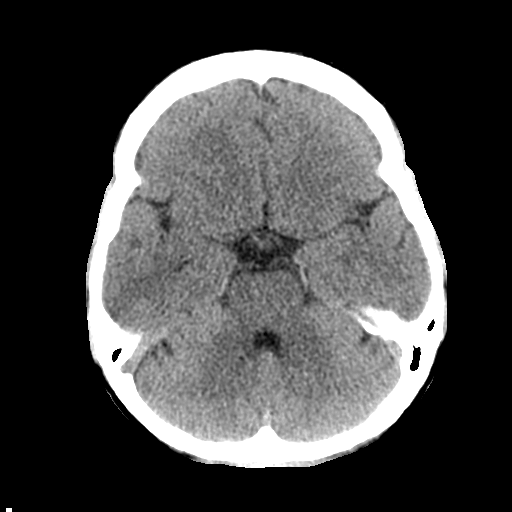
[im 11/28  brain]
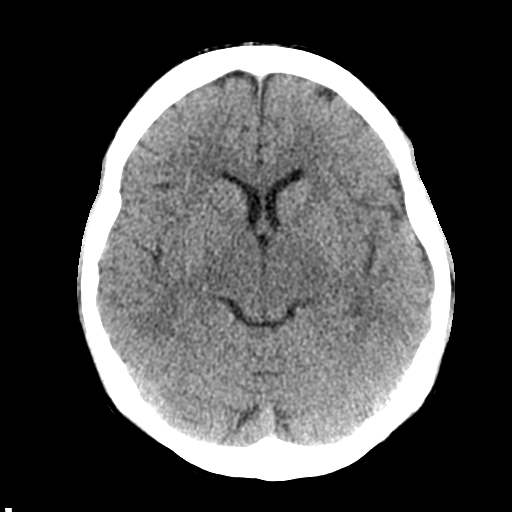
[im 13/28  brain]
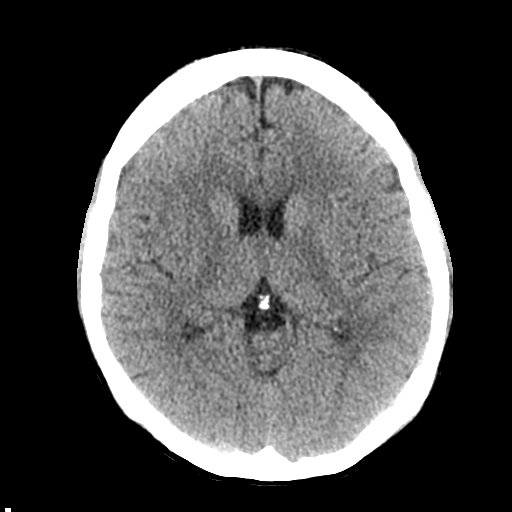
[im 13/28  bone]
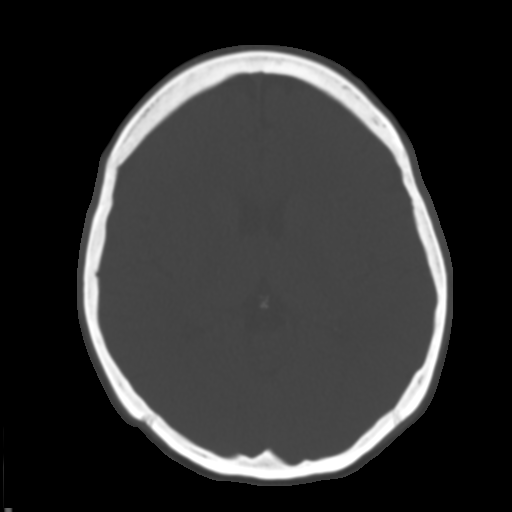
[im 16/28  brain]
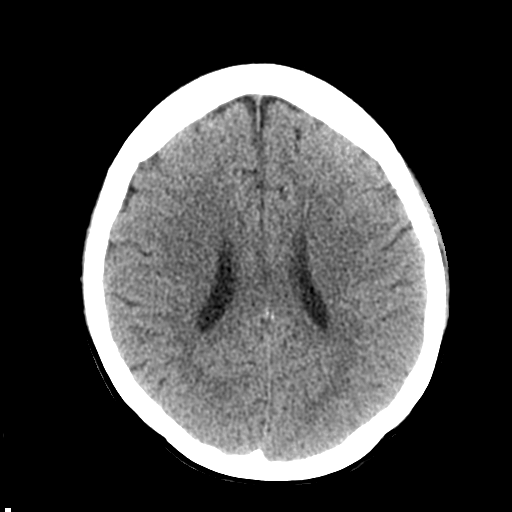
[im 18/28  brain]
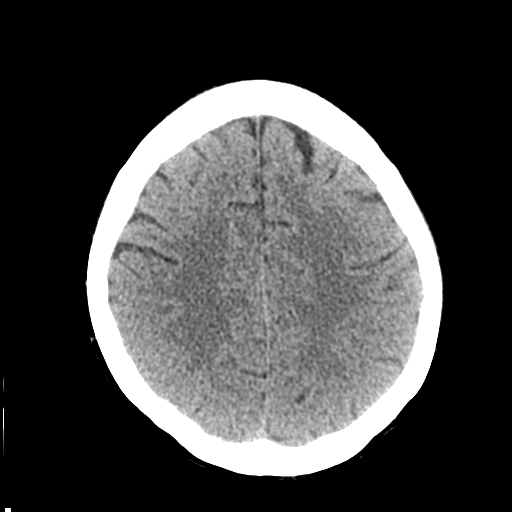
[im 21/28  brain]
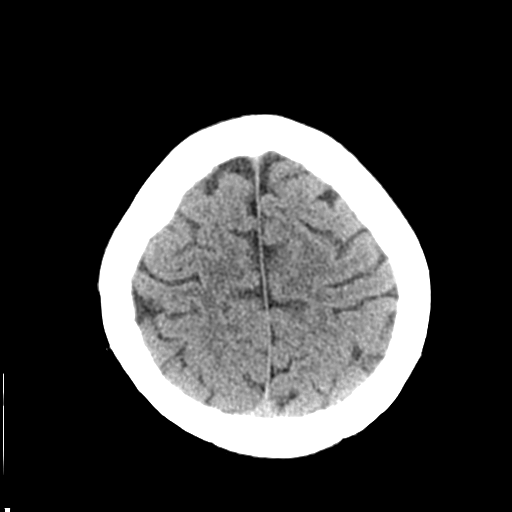
[im 24/28  brain]
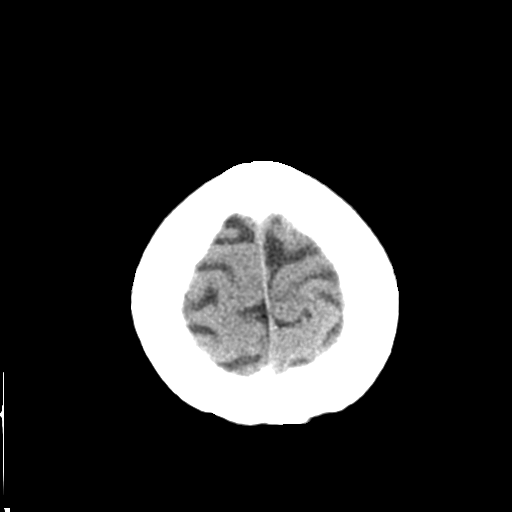
[im 24/28  bone]
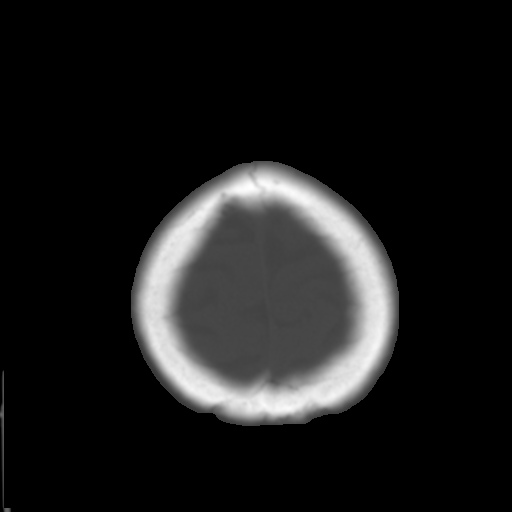
[im 26/28  brain]
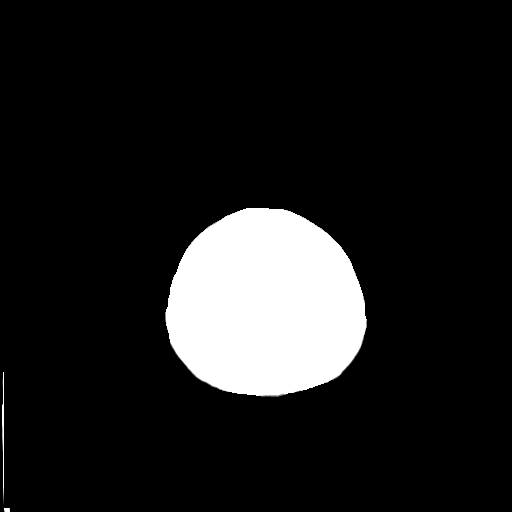

[Series 4: coronal soft tissue · coronal · 0.30mm/px · 3 of 62 slices shown]
[im 21/62  brain]
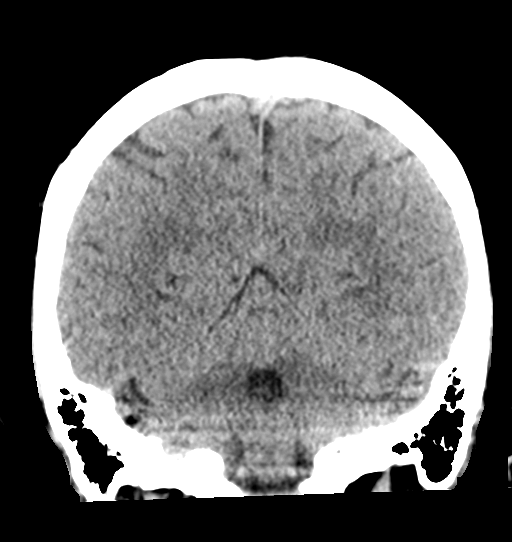
[im 28/62  brain]
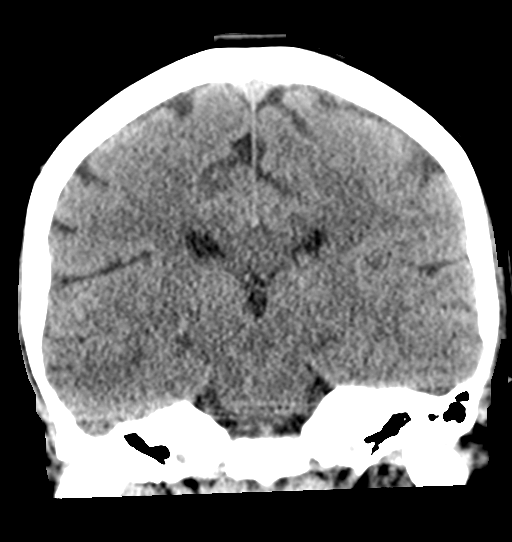
[im 34/62  brain]
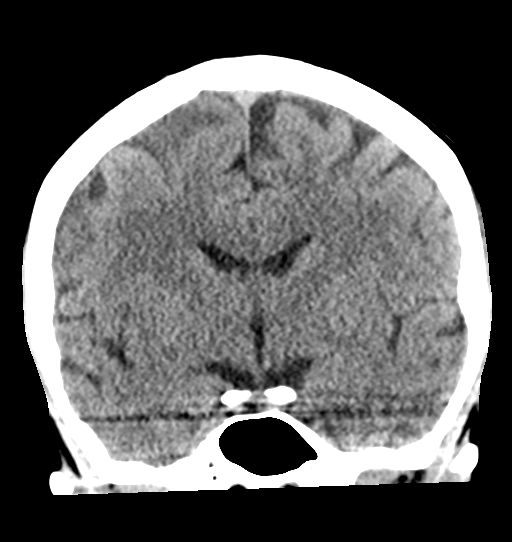

[Series 5: sagittal soft tissue · sagittal · 0.29mm/px · 3 of 54 slices shown]
[im 18/54  brain]
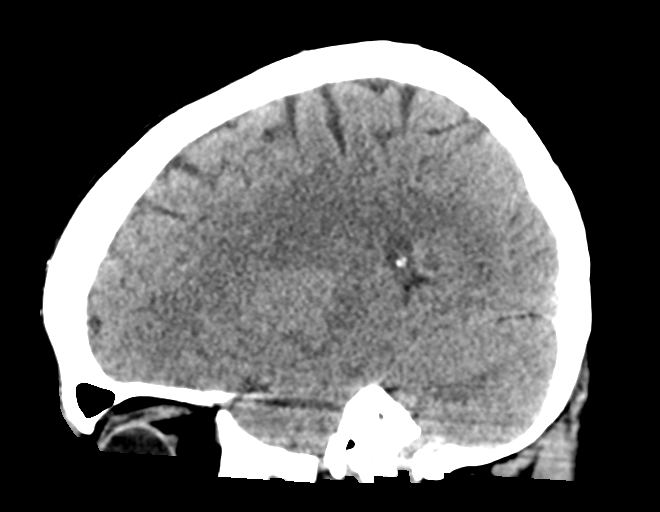
[im 27/54  brain]
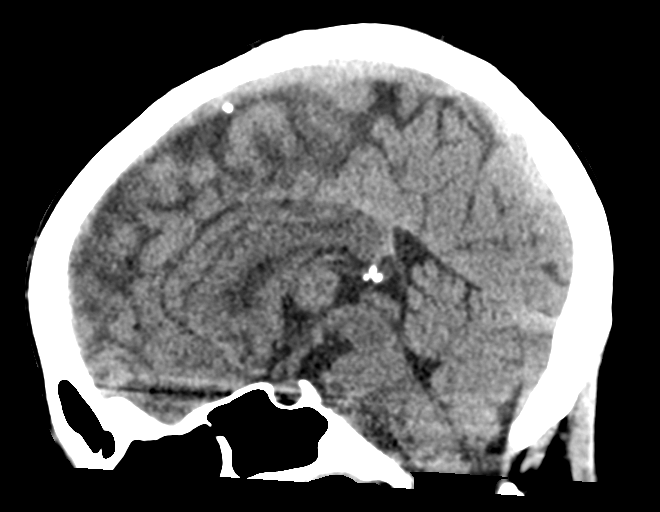
[im 36/54  brain]
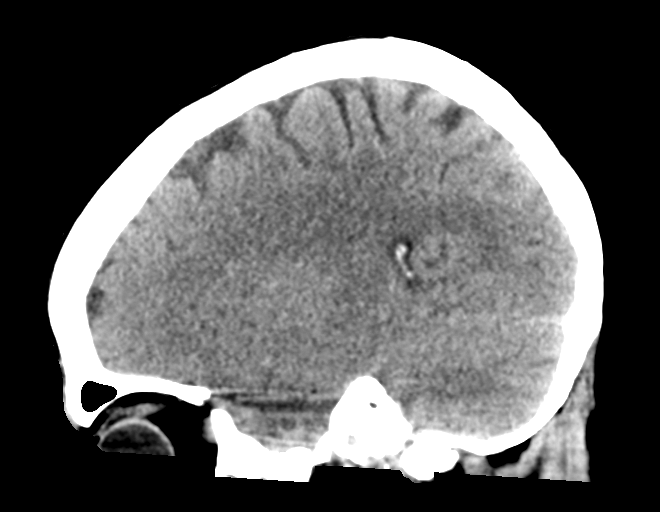

[16 of 45 positions shown; findings below may reference images not displayed]

FINDINGS: Brain: There is no evidence of an acute infarct, intracranial
hemorrhage, mass, midline shift, or extra-axial fluid collection.
The ventricles and sulci are normal.

Vascular: No hyperdense vessel.

Skull: No fracture or suspicious osseous lesion.

Sinuses/Orbits: Focal mucosal thickening or small volume secretions
in the right sphenoid sinus. Clear mastoid air cells. Unremarkable
included orbits.

Other: None.
IMPRESSION: Unremarkable CT appearance of the brain.

## 2022-08-07 NOTE — Progress Notes (Deleted)
There were no vitals taken for this visit.   Subjective:    Patient ID: Cristina Finley, female    DOB: 1975/08/19, 47 y.o.   MRN: [id number]  HPI: Cristina Finley is a 47 y.o. female presenting on 08/08/2022 for comprehensive medical examination. Current medical complaints include:none  She currently lives with: Menopausal Symptoms: no  HYPERTENSION / HYPERLIPIDEMIA Satisfied with current treatment? no Duration of hypertension: years BP monitoring frequency: not checking BP range:  BP medication side effects: no Past BP meds: none Duration of hyperlipidemia: years Cholesterol medication side effects: no Cholesterol supplements: none Past cholesterol medications: none Medication compliance: excellent compliance Aspirin: no Recent stressors: no Recurrent headaches: no Visual changes: no Palpitations: no Dyspnea: no Chest pain: no Lower extremity edema: no Dizzy/lightheaded: no  ANXIETY/DEPRESSION Patient continues to follow up with psychiatry.  She hasn't seen them for awhile. Denies SI. Denies concerns at visit today.   Depression Screen done today and results listed below:     01/16/2022   11:13 AM 12/16/2021    1:11 PM 05/23/2021    8:23 AM 04/06/2021    3:32 PM 03/02/2021    4:15 PM  Depression screen PHQ 2/9  Decreased Interest 0 0 '2 2 3  '$ Down, Depressed, Hopeless 1 0 '2 2 3  '$ PHQ - 2 Score 1 0 '4 4 6  '$ Altered sleeping 0 0 1 2 0  Tired, decreased energy 1 0 '1 2 2  '$ Change in appetite 1 1 0 1 0  Feeling bad or failure about yourself  0 0 0 1 2  Trouble concentrating 1 0 '1 2 3  '$ Moving slowly or fidgety/restless 0 0 0 2 0  Suicidal thoughts 0 0 0 0 0  PHQ-9 Score '4 1 7 14 13  '$ Difficult doing work/chores Not difficult at all Not difficult at all Somewhat difficult Not difficult at all Not difficult at all    The patient does not have a history of falls. I did complete a risk assessment for falls. A plan of care for falls was documented.   Past Medical History:   Past Medical History:  Diagnosis Date  . Depression     Surgical History:  Past Surgical History:  Procedure Laterality Date  . CHOLECYSTECTOMY  2000    Medications:  Current Outpatient Medications on File Prior to Visit  Medication Sig  . escitalopram (LEXAPRO) 20 MG tablet Take 1 tablet (20 mg total) by mouth at bedtime. OFFICE VISIT NEEDED FOR ADDITIONAL REFILLS  . OLANZapine zydis (ZYPREXA) 20 MG disintegrating tablet Take 1 tablet (20 mg total) by mouth at bedtime.   No current facility-administered medications on file prior to visit.    Allergies:  Allergies  Allergen Reactions  . Amoxicillin Hives  . Penicillins Hives and Nausea Only    Social History:  Social History   Socioeconomic History  . Marital status: Married    Spouse name: Not on file  . Number of children: Not on file  . Years of education: Not on file  . Highest education level: Not on file  Occupational History  . Not on file  Tobacco Use  . Smoking status: Every Day    Packs/day: 1.00    Types: Cigarettes  . Smokeless tobacco: Never  Vaping Use  . Vaping Use: Never used  Substance and Sexual Activity  . Alcohol use: Not Currently    Alcohol/week: 6.0 standard drinks of alcohol    Types: 6 Cans of beer per week  Comment: one 6 pack daily  . Drug use: Not Currently    Types: Marijuana  . Sexual activity: Yes    Partners: Male    Birth control/protection: Surgical  Other Topics Concern  . Not on file  Social History Narrative  . Not on file   Social Determinants of Health   Financial Resource Strain: Not on file  Food Insecurity: Not on file  Transportation Needs: Not on file  Physical Activity: Not on file  Stress: Not on file  Social Connections: Not on file  Intimate Partner Violence: Not on file   Social History   Tobacco Use  Smoking Status Every Day  . Packs/day: 1.00  . Types: Cigarettes  Smokeless Tobacco Never   Social History   Substance and Sexual  Activity  Alcohol Use Not Currently  . Alcohol/week: 6.0 standard drinks of alcohol  . Types: 6 Cans of beer per week   Comment: one 6 pack daily    Family History:  Family History  Problem Relation Age of Onset  . Depression Mother   . COPD Father   . Depression Paternal Aunt   . Heart disease Maternal Grandmother   . Cancer Maternal Grandfather   . Cancer Paternal Grandmother     Past medical history, surgical history, medications, allergies, family history and social history reviewed with patient today and changes made to appropriate areas of the chart.   Review of Systems  Eyes:  Negative for blurred vision and double vision.  Respiratory:  Negative for shortness of breath.   Cardiovascular:  Negative for chest pain, palpitations and leg swelling.  Neurological:  Negative for dizziness and headaches.  All other ROS negative except what is listed above and in the HPI.      Objective:    There were no vitals taken for this visit.  Wt Readings from Last 3 Encounters:  01/16/22 160 lb 4.8 oz (72.7 kg)  05/23/21 139 lb 12.8 oz (63.4 kg)  04/06/21 139 lb (63 kg)    Physical Exam Vitals and nursing note reviewed.  Constitutional:      General: She is awake. She is not in acute distress.    Appearance: She is well-developed. She is not ill-appearing.  HENT:     Head: Normocephalic and atraumatic.     Right Ear: Hearing, tympanic membrane, ear canal and external ear normal. No drainage.     Left Ear: Hearing, tympanic membrane, ear canal and external ear normal. No drainage.     Nose: Nose normal.     Right Sinus: No maxillary sinus tenderness or frontal sinus tenderness.     Left Sinus: No maxillary sinus tenderness or frontal sinus tenderness.     Mouth/Throat:     Mouth: Mucous membranes are moist.     Pharynx: Oropharynx is clear. Uvula midline. No pharyngeal swelling, oropharyngeal exudate or posterior oropharyngeal erythema.  Eyes:     General: Lids are normal.         Right eye: No discharge.        Left eye: No discharge.     Extraocular Movements: Extraocular movements intact.     Conjunctiva/sclera: Conjunctivae normal.     Pupils: Pupils are equal, round, and reactive to light.     Visual Fields: Right eye visual fields normal and left eye visual fields normal.  Neck:     Thyroid: No thyromegaly.     Vascular: No carotid bruit.     Trachea: Trachea normal.  Cardiovascular:  Rate and Rhythm: Normal rate and regular rhythm.     Heart sounds: Normal heart sounds. No murmur heard.    No gallop.  Pulmonary:     Effort: Pulmonary effort is normal. No accessory muscle usage or respiratory distress.     Breath sounds: Normal breath sounds.  Chest:  Breasts:    Right: Normal.     Left: Normal.  Abdominal:     General: Bowel sounds are normal.     Palpations: Abdomen is soft. There is no hepatomegaly or splenomegaly.     Tenderness: There is no abdominal tenderness.  Musculoskeletal:        General: Normal range of motion.     Cervical back: Normal range of motion and neck supple.     Right lower leg: No edema.     Left lower leg: No edema.  Lymphadenopathy:     Head:     Right side of head: No submental, submandibular, tonsillar, preauricular or posterior auricular adenopathy.     Left side of head: No submental, submandibular, tonsillar, preauricular or posterior auricular adenopathy.     Cervical: No cervical adenopathy.     Upper Body:     Right upper body: No supraclavicular, axillary or pectoral adenopathy.     Left upper body: No supraclavicular, axillary or pectoral adenopathy.  Skin:    General: Skin is warm and dry.     Capillary Refill: Capillary refill takes less than 2 seconds.     Findings: No rash.  Neurological:     Mental Status: She is alert and oriented to person, place, and time.     Gait: Gait is intact.     Deep Tendon Reflexes: Reflexes are normal and symmetric.     Reflex Scores:      Brachioradialis  reflexes are 2+ on the right side and 2+ on the left side.      Patellar reflexes are 2+ on the right side and 2+ on the left side. Psychiatric:        Attention and Perception: Attention normal.        Mood and Affect: Mood normal.        Speech: Speech normal.        Behavior: Behavior normal. Behavior is cooperative.        Thought Content: Thought content normal.        Judgment: Judgment normal.    Results for orders placed or performed in visit on 01/16/22  Comp Met (CMET)  Result Value Ref Range   Glucose 90 70 - 99 mg/dL   BUN 13 6 - 24 mg/dL   Creatinine, Ser 0.82 0.57 - 1.00 mg/dL   eGFR 88 >59 mL/min/1.73   BUN/Creatinine Ratio 16 9 - 23   Sodium 137 134 - 144 mmol/L   Potassium 4.6 3.5 - 5.2 mmol/L   Chloride 100 96 - 106 mmol/L   CO2 23 20 - 29 mmol/L   Calcium 9.8 8.7 - 10.2 mg/dL   Total Protein 6.5 6.0 - 8.5 g/dL   Albumin 4.3 3.9 - 4.9 g/dL   Globulin, Total 2.2 1.5 - 4.5 g/dL   Albumin/Globulin Ratio 2.0 1.2 - 2.2   Bilirubin Total 0.2 0.0 - 1.2 mg/dL   Alkaline Phosphatase 58 44 - 121 IU/L   AST 19 0 - 40 IU/L   ALT 14 0 - 32 IU/L  CBC w/Diff  Result Value Ref Range   WBC 6.0 3.4 - 10.8 x10E3/uL   RBC  4.20 3.77 - 5.28 x10E6/uL   Hemoglobin 13.5 11.1 - 15.9 g/dL   Hematocrit 40.4 34.0 - 46.6 %   MCV 96 79 - 97 fL   MCH 32.1 26.6 - 33.0 pg   MCHC 33.4 31.5 - 35.7 g/dL   RDW 14.2 11.7 - 15.4 %   Platelets 252 150 - 450 x10E3/uL   Neutrophils 44 Not Estab. %   Lymphs 43 Not Estab. %   Monocytes 10 Not Estab. %   Eos 2 Not Estab. %   Basos 1 Not Estab. %   Neutrophils Absolute 2.7 1.4 - 7.0 x10E3/uL   Lymphocytes Absolute 2.6 0.7 - 3.1 x10E3/uL   Monocytes Absolute 0.6 0.1 - 0.9 x10E3/uL   EOS (ABSOLUTE) 0.1 0.0 - 0.4 x10E3/uL   Basophils Absolute 0.0 0.0 - 0.2 x10E3/uL   Immature Granulocytes 0 Not Estab. %   Immature Grans (Abs) 0.0 0.0 - 0.1 x10E3/uL      Assessment & Plan:   Problem List Items Addressed This Visit      Other   Depression  with anxiety   MDD (major depressive disorder), recurrent, severe, with psychosis ([place])  Other Visit Diagnoses    Annual physical exam    -  Primary   Screening for ischemic heart disease           Follow up plan: No follow-ups on file.   LABORATORY TESTING:  - Pap smear: pap done  IMMUNIZATIONS:   - Tdap: Tetanus vaccination status reviewed: last tetanus booster within 10 years. - Influenza: Postponed to flu season - Pneumovax: Administered today - Prevnar: Not applicable - HPV: Not applicable - Zostavax vaccine: Not applicable  SCREENING: -Mammogram: Ordered today  - Colonoscopy: Refused  - Bone Density: Not applicable  -Hearing Test: Not applicable  -Spirometry: Not applicable   PATIENT COUNSELING:   Advised to take 1 mg of folate supplement per day if capable of pregnancy.   Sexuality: Discussed sexually transmitted diseases, partner selection, use of condoms, avoidance of unintended pregnancy  and contraceptive alternatives.   Advised to avoid cigarette smoking.  I discussed with the patient that most people either abstain from alcohol or drink within safe limits (<=14/week and <=4 drinks/occasion for males, <=7/weeks and <= 3 drinks/occasion for females) and that the risk for alcohol disorders and other health effects rises proportionally with the number of drinks per week and how often a drinker exceeds daily limits.  Discussed cessation/primary prevention of drug use and availability of treatment for abuse.   Diet: Encouraged to adjust caloric intake to maintain  or achieve ideal body weight, to reduce intake of dietary saturated fat and total fat, to limit sodium intake by avoiding high sodium foods and not adding table salt, and to maintain adequate dietary potassium and calcium preferably from fresh fruits, vegetables, and low-fat dairy products.    stressed the importance of regular exercise  Injury prevention: Discussed safety belts, safety helmets, smoke  detector, smoking near bedding or upholstery.   Dental health: Discussed importance of regular tooth brushing, flossing, and dental visits.    NEXT PREVENTATIVE PHYSICAL DUE IN 1 YEAR. No follow-ups on file.

## 2022-08-08 ENCOUNTER — Encounter: Payer: BC Managed Care – PPO | Admitting: Nurse Practitioner

## 2022-08-08 DIAGNOSIS — Z136 Encounter for screening for cardiovascular disorders: Secondary | ICD-10-CM

## 2022-08-08 DIAGNOSIS — F418 Other specified anxiety disorders: Secondary | ICD-10-CM

## 2022-08-08 DIAGNOSIS — F333 Major depressive disorder, recurrent, severe with psychotic symptoms: Secondary | ICD-10-CM

## 2022-08-08 DIAGNOSIS — Z0000 Encounter for general adult medical examination without abnormal findings: Secondary | ICD-10-CM

## 2022-09-07 ENCOUNTER — Telehealth: Payer: Self-pay | Admitting: Nurse Practitioner

## 2022-09-07 ENCOUNTER — Ambulatory Visit: Payer: BC Managed Care – PPO | Admitting: Nurse Practitioner

## 2022-09-07 MED ORDER — ESCITALOPRAM OXALATE 20 MG PO TABS: 20.0000 mg | ORAL_TABLET | Freq: Every day | ORAL | 0 refills | Status: DC

## 2022-09-07 MED ORDER — OLANZAPINE 20 MG PO TBDP: 20.0000 mg | ORAL_TABLET | Freq: Every day | ORAL | 0 refills | Status: DC

## 2022-09-07 NOTE — Progress Notes (Deleted)
There were no vitals taken for this visit.   Subjective:    Patient ID: Cristina Finley, female    DOB: 27-Jun-1975, 47 y.o.   MRN: [id number]  HPI: Cristina Finley is a 47 y.o. female presenting on 09/07/2022 for comprehensive medical examination. Current medical complaints include:none  She currently lives with: Menopausal Symptoms: no  HYPERTENSION / HYPERLIPIDEMIA Satisfied with current treatment? no Duration of hypertension: years BP monitoring frequency: not checking BP range:  BP medication side effects: no Past BP meds: none Duration of hyperlipidemia: years Cholesterol medication side effects: no Cholesterol supplements: none Past cholesterol medications: none Medication compliance: excellent compliance Aspirin: no Recent stressors: no Recurrent headaches: no Visual changes: no Palpitations: no Dyspnea: no Chest pain: no Lower extremity edema: no Dizzy/lightheaded: no  ANXIETY/DEPRESSION Patient continues to follow up with psychiatry.  She hasn't seen them for awhile. Denies SI. Denies concerns at visit today.   Depression Screen done today and results listed below:     01/16/2022   11:13 AM 12/16/2021    1:11 PM 05/23/2021    8:23 AM 04/06/2021    3:32 PM 03/02/2021    4:15 PM  Depression screen PHQ 2/9  Decreased Interest 0 0 2 2 3   Down, Depressed, Hopeless 1 0 2 2 3   PHQ - 2 Score 1 0 4 4 6   Altered sleeping 0 0 1 2 0  Tired, decreased energy 1 0 1 2 2   Change in appetite 1 1 0 1 0  Feeling bad or failure about yourself  0 0 0 1 2  Trouble concentrating 1 0 1 2 3   Moving slowly or fidgety/restless 0 0 0 2 0  Suicidal thoughts 0 0 0 0 0  PHQ-9 Score 4 1 7 14 13   Difficult doing work/chores Not difficult at all Not difficult at all Somewhat difficult Not difficult at all Not difficult at all    The patient does not have a history of falls. I did complete a risk assessment for falls. A plan of care for falls was documented.   Past Medical History:   Past Medical History:  Diagnosis Date  . Depression     Surgical History:  Past Surgical History:  Procedure Laterality Date  . CHOLECYSTECTOMY  2000    Medications:  Current Outpatient Medications on File Prior to Visit  Medication Sig  . escitalopram (LEXAPRO) 20 MG tablet Take 1 tablet (20 mg total) by mouth at bedtime. OFFICE VISIT NEEDED FOR ADDITIONAL REFILLS  . OLANZapine zydis (ZYPREXA) 20 MG disintegrating tablet Take 1 tablet (20 mg total) by mouth at bedtime.   No current facility-administered medications on file prior to visit.    Allergies:  Allergies  Allergen Reactions  . Amoxicillin Hives  . Penicillins Hives and Nausea Only    Social History:  Social History   Socioeconomic History  . Marital status: Married    Spouse name: Not on file  . Number of children: Not on file  . Years of education: Not on file  . Highest education level: Not on file  Occupational History  . Not on file  Tobacco Use  . Smoking status: Every Day    Packs/day: 1    Types: Cigarettes  . Smokeless tobacco: Never  Vaping Use  . Vaping Use: Never used  Substance and Sexual Activity  . Alcohol use: Not Currently    Alcohol/week: 6.0 standard drinks of alcohol    Types: 6 Cans of beer per week  Comment: one 6 pack daily  . Drug use: Not Currently    Types: Marijuana  . Sexual activity: Yes    Partners: Male    Birth control/protection: Surgical  Other Topics Concern  . Not on file  Social History Narrative  . Not on file   Social Determinants of Health   Financial Resource Strain: Not on file  Food Insecurity: Not on file  Transportation Needs: Not on file  Physical Activity: Not on file  Stress: Not on file  Social Connections: Not on file  Intimate Partner Violence: Not on file   Social History   Tobacco Use  Smoking Status Every Day  . Packs/day: 1  . Types: Cigarettes  Smokeless Tobacco Never   Social History   Substance and Sexual Activity   Alcohol Use Not Currently  . Alcohol/week: 6.0 standard drinks of alcohol  . Types: 6 Cans of beer per week   Comment: one 6 pack daily    Family History:  Family History  Problem Relation Age of Onset  . Depression Mother   . COPD Father   . Depression Paternal Aunt   . Heart disease Maternal Grandmother   . Cancer Maternal Grandfather   . Cancer Paternal Grandmother     Past medical history, surgical history, medications, allergies, family history and social history reviewed with patient today and changes made to appropriate areas of the chart.   Review of Systems  Eyes:  Negative for blurred vision and double vision.  Respiratory:  Negative for shortness of breath.   Cardiovascular:  Negative for chest pain, palpitations and leg swelling.  Neurological:  Negative for dizziness and headaches.  All other ROS negative except what is listed above and in the HPI.      Objective:    There were no vitals taken for this visit.  Wt Readings from Last 3 Encounters:  01/16/22 160 lb 4.8 oz (72.7 kg)  05/23/21 139 lb 12.8 oz (63.4 kg)  04/06/21 139 lb (63 kg)    Physical Exam Vitals and nursing note reviewed.  Constitutional:      General: She is awake. She is not in acute distress.    Appearance: She is well-developed. She is not ill-appearing.  HENT:     Head: Normocephalic and atraumatic.     Right Ear: Hearing, tympanic membrane, ear canal and external ear normal. No drainage.     Left Ear: Hearing, tympanic membrane, ear canal and external ear normal. No drainage.     Nose: Nose normal.     Right Sinus: No maxillary sinus tenderness or frontal sinus tenderness.     Left Sinus: No maxillary sinus tenderness or frontal sinus tenderness.     Mouth/Throat:     Mouth: Mucous membranes are moist.     Pharynx: Oropharynx is clear. Uvula midline. No pharyngeal swelling, oropharyngeal exudate or posterior oropharyngeal erythema.  Eyes:     General: Lids are normal.         Right eye: No discharge.        Left eye: No discharge.     Extraocular Movements: Extraocular movements intact.     Conjunctiva/sclera: Conjunctivae normal.     Pupils: Pupils are equal, round, and reactive to light.     Visual Fields: Right eye visual fields normal and left eye visual fields normal.  Neck:     Thyroid: No thyromegaly.     Vascular: No carotid bruit.     Trachea: Trachea normal.  Cardiovascular:  Rate and Rhythm: Normal rate and regular rhythm.     Heart sounds: Normal heart sounds. No murmur heard.    No gallop.  Pulmonary:     Effort: Pulmonary effort is normal. No accessory muscle usage or respiratory distress.     Breath sounds: Normal breath sounds.  Chest:  Breasts:    Right: Normal.     Left: Normal.  Abdominal:     General: Bowel sounds are normal.     Palpations: Abdomen is soft. There is no hepatomegaly or splenomegaly.     Tenderness: There is no abdominal tenderness.  Musculoskeletal:        General: Normal range of motion.     Cervical back: Normal range of motion and neck supple.     Right lower leg: No edema.     Left lower leg: No edema.  Lymphadenopathy:     Head:     Right side of head: No submental, submandibular, tonsillar, preauricular or posterior auricular adenopathy.     Left side of head: No submental, submandibular, tonsillar, preauricular or posterior auricular adenopathy.     Cervical: No cervical adenopathy.     Upper Body:     Right upper body: No supraclavicular, axillary or pectoral adenopathy.     Left upper body: No supraclavicular, axillary or pectoral adenopathy.  Skin:    General: Skin is warm and dry.     Capillary Refill: Capillary refill takes less than 2 seconds.     Findings: No rash.  Neurological:     Mental Status: She is alert and oriented to person, place, and time.     Gait: Gait is intact.     Deep Tendon Reflexes: Reflexes are normal and symmetric.     Reflex Scores:      Brachioradialis reflexes are  2+ on the right side and 2+ on the left side.      Patellar reflexes are 2+ on the right side and 2+ on the left side. Psychiatric:        Attention and Perception: Attention normal.        Mood and Affect: Mood normal.        Speech: Speech normal.        Behavior: Behavior normal. Behavior is cooperative.        Thought Content: Thought content normal.        Judgment: Judgment normal.    Results for orders placed or performed in visit on 01/16/22  Comp Met (CMET)  Result Value Ref Range   Glucose 90 70 - 99 mg/dL   BUN 13 6 - 24 mg/dL   Creatinine, Ser 0.82 0.57 - 1.00 mg/dL   eGFR 88 >59 mL/min/1.73   BUN/Creatinine Ratio 16 9 - 23   Sodium 137 134 - 144 mmol/L   Potassium 4.6 3.5 - 5.2 mmol/L   Chloride 100 96 - 106 mmol/L   CO2 23 20 - 29 mmol/L   Calcium 9.8 8.7 - 10.2 mg/dL   Total Protein 6.5 6.0 - 8.5 g/dL   Albumin 4.3 3.9 - 4.9 g/dL   Globulin, Total 2.2 1.5 - 4.5 g/dL   Albumin/Globulin Ratio 2.0 1.2 - 2.2   Bilirubin Total 0.2 0.0 - 1.2 mg/dL   Alkaline Phosphatase 58 44 - 121 IU/L   AST 19 0 - 40 IU/L   ALT 14 0 - 32 IU/L  CBC w/Diff  Result Value Ref Range   WBC 6.0 3.4 - 10.8 x10E3/uL   RBC  4.20 3.77 - 5.28 x10E6/uL   Hemoglobin 13.5 11.1 - 15.9 g/dL   Hematocrit 40.4 34.0 - 46.6 %   MCV 96 79 - 97 fL   MCH 32.1 26.6 - 33.0 pg   MCHC 33.4 31.5 - 35.7 g/dL   RDW 14.2 11.7 - 15.4 %   Platelets 252 150 - 450 x10E3/uL   Neutrophils 44 Not Estab. %   Lymphs 43 Not Estab. %   Monocytes 10 Not Estab. %   Eos 2 Not Estab. %   Basos 1 Not Estab. %   Neutrophils Absolute 2.7 1.4 - 7.0 x10E3/uL   Lymphocytes Absolute 2.6 0.7 - 3.1 x10E3/uL   Monocytes Absolute 0.6 0.1 - 0.9 x10E3/uL   EOS (ABSOLUTE) 0.1 0.0 - 0.4 x10E3/uL   Basophils Absolute 0.0 0.0 - 0.2 x10E3/uL   Immature Granulocytes 0 Not Estab. %   Immature Grans (Abs) 0.0 0.0 - 0.1 x10E3/uL      Assessment & Plan:   Problem List Items Addressed This Visit   None    Follow up plan: No  follow-ups on file.   LABORATORY TESTING:  - Pap smear: pap done  IMMUNIZATIONS:   - Tdap: Tetanus vaccination status reviewed: last tetanus booster within 10 years. - Influenza: Postponed to flu season - Pneumovax: Administered today - Prevnar: Not applicable - HPV: Not applicable - Zostavax vaccine: Not applicable  SCREENING: -Mammogram: Ordered today  - Colonoscopy: Refused  - Bone Density: Not applicable  -Hearing Test: Not applicable  -Spirometry: Not applicable   PATIENT COUNSELING:   Advised to take 1 mg of folate supplement per day if capable of pregnancy.   Sexuality: Discussed sexually transmitted diseases, partner selection, use of condoms, avoidance of unintended pregnancy  and contraceptive alternatives.   Advised to avoid cigarette smoking.  I discussed with the patient that most people either abstain from alcohol or drink within safe limits (<=14/week and <=4 drinks/occasion for males, <=7/weeks and <= 3 drinks/occasion for females) and that the risk for alcohol disorders and other health effects rises proportionally with the number of drinks per week and how often a drinker exceeds daily limits.  Discussed cessation/primary prevention of drug use and availability of treatment for abuse.   Diet: Encouraged to adjust caloric intake to maintain  or achieve ideal body weight, to reduce intake of dietary saturated fat and total fat, to limit sodium intake by avoiding high sodium foods and not adding table salt, and to maintain adequate dietary potassium and calcium preferably from fresh fruits, vegetables, and low-fat dairy products.    stressed the importance of regular exercise  Injury prevention: Discussed safety belts, safety helmets, smoke detector, smoking near bedding or upholstery.   Dental health: Discussed importance of regular tooth brushing, flossing, and dental visits.    NEXT PREVENTATIVE PHYSICAL DUE IN 1 YEAR. No follow-ups on  file.

## 2022-09-07 NOTE — Telephone Encounter (Signed)
Patient came in for her appointment but wasn't able to be seen due to payment issue.  I have sent in a 30 day supply as a courtesy since patient is 6 months over due for appt.  She will have to be seen to get future refills.

## 2022-09-19 ENCOUNTER — Encounter: Payer: Self-pay | Admitting: Nurse Practitioner

## 2022-09-19 ENCOUNTER — Ambulatory Visit (INDEPENDENT_AMBULATORY_CARE_PROVIDER_SITE_OTHER): Payer: BC Managed Care – PPO | Admitting: Nurse Practitioner

## 2022-09-19 VITALS — BP 129/70 | HR 74 | Temp 98.4°F | Wt 182.6 lb

## 2022-09-19 DIAGNOSIS — F418 Other specified anxiety disorders: Secondary | ICD-10-CM | POA: Diagnosis not present

## 2022-09-19 DIAGNOSIS — Z0000 Encounter for general adult medical examination without abnormal findings: Secondary | ICD-10-CM | POA: Diagnosis not present

## 2022-09-19 DIAGNOSIS — F333 Major depressive disorder, recurrent, severe with psychotic symptoms: Secondary | ICD-10-CM

## 2022-09-19 DIAGNOSIS — Z1211 Encounter for screening for malignant neoplasm of colon: Secondary | ICD-10-CM

## 2022-09-19 DIAGNOSIS — Z23 Encounter for immunization: Secondary | ICD-10-CM

## 2022-09-19 DIAGNOSIS — Z136 Encounter for screening for cardiovascular disorders: Secondary | ICD-10-CM

## 2022-09-19 LAB — URINALYSIS, ROUTINE W REFLEX MICROSCOPIC
Bilirubin, UA: NEGATIVE
Glucose, UA: NEGATIVE
Ketones, UA: NEGATIVE
Leukocytes,UA: NEGATIVE
Nitrite, UA: NEGATIVE
Protein,UA: NEGATIVE
RBC, UA: NEGATIVE
Specific Gravity, UA: 1.01 (ref 1.005–1.030)
Urobilinogen, Ur: 0.2 mg/dL (ref 0.2–1.0)
pH, UA: 6 (ref 5.0–7.5)

## 2022-09-19 MED ORDER — OLANZAPINE 20 MG PO TBDP: 20.0000 mg | ORAL_TABLET | Freq: Every day | ORAL | 1 refills | Status: DC

## 2022-09-19 MED ORDER — ESCITALOPRAM OXALATE 20 MG PO TABS: 20.0000 mg | ORAL_TABLET | Freq: Every day | ORAL | 1 refills | Status: DC

## 2022-09-19 NOTE — Assessment & Plan Note (Signed)
Chronic.  Controlled.  Continue with current medication regimen of Zyprexa and Lexapro.  Refills sent today.  Labs ordered today.  Return to clinic in 6 months for reevaluation.  Call sooner if concerns arise.

## 2022-09-19 NOTE — Progress Notes (Signed)
BP 129/70 Comment: home blood pressure reading  Pulse 74   Temp 98.4 F (36.9 C) (Oral)   Wt 182 lb 9.6 oz (82.8 kg)   LMP 08/22/2022 (Approximate)   SpO2 98%   BMI 32.35 kg/m    Subjective:    Patient ID: Cristina Finley, female    DOB: [birth year], 47 y.o.   MRN: [id number]  HPI: Cristina Finley is a 47 y.o. female presenting on 09/19/2022 for comprehensive medical examination. Current medical complaints include:none  She currently lives with: Menopausal Symptoms: no  HYPERTENSION / HYPERLIPIDEMIA Satisfied with current treatment? no Duration of hypertension: years BP monitoring frequency: twice weekly BP range: 129/70 BP medication side effects: no Past BP meds: none Duration of hyperlipidemia: years Cholesterol medication side effects: no Cholesterol supplements: none Past cholesterol medications: none Medication compliance: excellent compliance Aspirin: no Recent stressors: no Recurrent headaches: no Visual changes: no Palpitations: no Dyspnea: no Chest pain: no Lower extremity edema: no Dizzy/lightheaded: no  ANXIETY/DEPRESSION Patient states her mood has been okay.  She is still taking the Zyprexa and Lexapro.  She feels like they are working well for her.  Denies SI.  Denies concerns at visit today.    Depression Screen done today and results listed below:     09/19/2022    2:48 PM 01/16/2022   11:13 AM 12/16/2021    1:11 PM 05/23/2021    8:23 AM 04/06/2021    3:32 PM  Depression screen PHQ 2/9  Decreased Interest 2 0 0 2 2  Down, Depressed, Hopeless 1 1 0 2 2  PHQ - 2 Score 3 1 0 4 4  Altered sleeping 1 0 0 1 2  Tired, decreased energy 1 1 0 1 2  Change in appetite 1 1 1  0 1  Feeling bad or failure about yourself  1 0 0 0 1  Trouble concentrating 1 1 0 1 2  Moving slowly or fidgety/restless 0 0 0 0 2  Suicidal thoughts 0 0 0 0 0  PHQ-9 Score 8 4 1 7 14   Difficult doing work/chores Somewhat difficult Not difficult at all Not difficult at all Somewhat  difficult Not difficult at all    The patient does not have a history of falls. I did complete a risk assessment for falls. A plan of care for falls was documented.   Past Medical History:  Past Medical History:  Diagnosis Date   Depression     Surgical History:  Past Surgical History:  Procedure Laterality Date   CHOLECYSTECTOMY  2000    Medications:  Current Outpatient Medications on File Prior to Visit  Medication Sig   escitalopram (LEXAPRO) 20 MG tablet Take 1 tablet (20 mg total) by mouth at bedtime. OFFICE VISIT NEEDED FOR ADDITIONAL REFILLS   OLANZapine zydis (ZYPREXA) 20 MG disintegrating tablet Take 1 tablet (20 mg total) by mouth at bedtime.   No current facility-administered medications on file prior to visit.    Allergies:  Allergies  Allergen Reactions   Amoxicillin Hives   Penicillins Hives and Nausea Only    Social History:  Social History   Socioeconomic History   Marital status: Married    Spouse name: Not on file   Number of children: Not on file   Years of education: Not on file   Highest education level: Not on file  Occupational History   Not on file  Tobacco Use   Smoking status: Every Day    Packs/day: 1    Types:  Cigarettes   Smokeless tobacco: Never  Vaping Use   Vaping Use: Never used  Substance and Sexual Activity   Alcohol use: Not Currently    Alcohol/week: 6.0 standard drinks of alcohol    Types: 6 Cans of beer per week    Comment: one 6 pack daily   Drug use: Not Currently    Types: Marijuana   Sexual activity: Yes    Partners: Male    Birth control/protection: Surgical  Other Topics Concern   Not on file  Social History Narrative   Not on file   Social Determinants of Health   Financial Resource Strain: Not on file  Food Insecurity: Not on file  Transportation Needs: Not on file  Physical Activity: Not on file  Stress: Not on file  Social Connections: Not on file  Intimate Partner Violence: Not on file    Social History   Tobacco Use  Smoking Status Every Day   Packs/day: 1   Types: Cigarettes  Smokeless Tobacco Never   Social History   Substance and Sexual Activity  Alcohol Use Not Currently   Alcohol/week: 6.0 standard drinks of alcohol   Types: 6 Cans of beer per week   Comment: one 6 pack daily    Family History:  Family History  Problem Relation Age of Onset   Depression Mother    COPD Father    Depression Paternal Aunt    Heart disease Maternal Grandmother    Cancer Maternal Grandfather    Cancer Paternal Grandmother     Past medical history, surgical history, medications, allergies, family history and social history reviewed with patient today and changes made to appropriate areas of the chart.   Review of Systems  Eyes:  Negative for blurred vision and double vision.  Respiratory:  Negative for shortness of breath.   Cardiovascular:  Negative for chest pain, palpitations and leg swelling.  Neurological:  Negative for dizziness and headaches.  Psychiatric/Behavioral:  Positive for depression. Negative for suicidal ideas. The patient is nervous/anxious.    All other ROS negative except what is listed above and in the HPI.      Objective:    BP 129/70 Comment: home blood pressure reading  Pulse 74   Temp 98.4 F (36.9 C) (Oral)   Wt 182 lb 9.6 oz (82.8 kg)   LMP 08/22/2022 (Approximate)   SpO2 98%   BMI 32.35 kg/m   Wt Readings from Last 3 Encounters:  09/19/22 182 lb 9.6 oz (82.8 kg)  01/16/22 160 lb 4.8 oz (72.7 kg)  05/23/21 139 lb 12.8 oz (63.4 kg)    Physical Exam Vitals and nursing note reviewed.  Constitutional:      General: She is awake. She is not in acute distress.    Appearance: Normal appearance. She is well-developed and normal weight. She is not ill-appearing.  HENT:     Head: Normocephalic and atraumatic.     Right Ear: Hearing, tympanic membrane, ear canal and external ear normal. No drainage.     Left Ear: Hearing, tympanic  membrane, ear canal and external ear normal. No drainage.     Nose: Nose normal.     Right Sinus: No maxillary sinus tenderness or frontal sinus tenderness.     Left Sinus: No maxillary sinus tenderness or frontal sinus tenderness.     Mouth/Throat:     Mouth: Mucous membranes are moist.     Pharynx: Oropharynx is clear. Uvula midline. No pharyngeal swelling, oropharyngeal exudate or posterior oropharyngeal  erythema.  Eyes:     General: Lids are normal.        Right eye: No discharge.        Left eye: No discharge.     Extraocular Movements: Extraocular movements intact.     Conjunctiva/sclera: Conjunctivae normal.     Pupils: Pupils are equal, round, and reactive to light.     Visual Fields: Right eye visual fields normal and left eye visual fields normal.  Neck:     Thyroid: No thyromegaly.     Vascular: No carotid bruit.     Trachea: Trachea normal.  Cardiovascular:     Rate and Rhythm: Normal rate and regular rhythm.     Heart sounds: Normal heart sounds. No murmur heard.    No gallop.  Pulmonary:     Effort: Pulmonary effort is normal. No accessory muscle usage or respiratory distress.     Breath sounds: Normal breath sounds.  Chest:  Breasts:    Right: Normal.     Left: Normal.  Abdominal:     General: Bowel sounds are normal.     Palpations: Abdomen is soft. There is no hepatomegaly or splenomegaly.     Tenderness: There is no abdominal tenderness.  Musculoskeletal:        General: Normal range of motion.     Cervical back: Normal range of motion and neck supple.     Right lower leg: No edema.     Left lower leg: No edema.  Lymphadenopathy:     Head:     Right side of head: No submental, submandibular, tonsillar, preauricular or posterior auricular adenopathy.     Left side of head: No submental, submandibular, tonsillar, preauricular or posterior auricular adenopathy.     Cervical: No cervical adenopathy.     Upper Body:     Right upper body: No supraclavicular,  axillary or pectoral adenopathy.     Left upper body: No supraclavicular, axillary or pectoral adenopathy.  Skin:    General: Skin is warm and dry.     Capillary Refill: Capillary refill takes less than 2 seconds.     Findings: No rash.  Neurological:     Mental Status: She is alert and oriented to person, place, and time.     Gait: Gait is intact.     Deep Tendon Reflexes: Reflexes are normal and symmetric.     Reflex Scores:      Brachioradialis reflexes are 2+ on the right side and 2+ on the left side.      Patellar reflexes are 2+ on the right side and 2+ on the left side. Psychiatric:        Attention and Perception: Attention normal.        Mood and Affect: Mood normal.        Speech: Speech normal.        Behavior: Behavior normal. Behavior is cooperative.        Thought Content: Thought content normal.        Judgment: Judgment normal.     Results for orders placed or performed in visit on 01/16/22  Comp Met (CMET)  Result Value Ref Range   Glucose 90 70 - 99 mg/dL   BUN 13 6 - 24 mg/dL   Creatinine, Ser 3.66 0.57 - 1.00 mg/dL   eGFR 88 >29 UT/MLY/6.50   BUN/Creatinine Ratio 16 9 - 23   Sodium 137 134 - 144 mmol/L   Potassium 4.6 3.5 - 5.2 mmol/L  Chloride 100 96 - 106 mmol/L   CO2 23 20 - 29 mmol/L   Calcium 9.8 8.7 - 10.2 mg/dL   Total Protein 6.5 6.0 - 8.5 g/dL   Albumin 4.3 3.9 - 4.9 g/dL   Globulin, Total 2.2 1.5 - 4.5 g/dL   Albumin/Globulin Ratio 2.0 1.2 - 2.2   Bilirubin Total 0.2 0.0 - 1.2 mg/dL   Alkaline Phosphatase 58 44 - 121 IU/L   AST 19 0 - 40 IU/L   ALT 14 0 - 32 IU/L  CBC w/Diff  Result Value Ref Range   WBC 6.0 3.4 - 10.8 x10E3/uL   RBC 4.20 3.77 - 5.28 x10E6/uL   Hemoglobin 13.5 11.1 - 15.9 g/dL   Hematocrit 52.8 41.3 - 46.6 %   MCV 96 79 - 97 fL   MCH 32.1 26.6 - 33.0 pg   MCHC 33.4 31.5 - 35.7 g/dL   RDW 24.4 01.0 - 27.2 %   Platelets 252 150 - 450 x10E3/uL   Neutrophils 44 Not Estab. %   Lymphs 43 Not Estab. %   Monocytes 10 Not  Estab. %   Eos 2 Not Estab. %   Basos 1 Not Estab. %   Neutrophils Absolute 2.7 1.4 - 7.0 x10E3/uL   Lymphocytes Absolute 2.6 0.7 - 3.1 x10E3/uL   Monocytes Absolute 0.6 0.1 - 0.9 x10E3/uL   EOS (ABSOLUTE) 0.1 0.0 - 0.4 x10E3/uL   Basophils Absolute 0.0 0.0 - 0.2 x10E3/uL   Immature Granulocytes 0 Not Estab. %   Immature Grans (Abs) 0.0 0.0 - 0.1 x10E3/uL      Assessment & Plan:   Problem List Items Addressed This Visit       Other   Depression with anxiety    Chronic.  Controlled.  Continue with current medication regimen of Zyprexa and Lexapro.  Refills sent today.  Labs ordered today.  Return to clinic in 6 months for reevaluation.  Call sooner if concerns arise.       MDD (major depressive disorder), recurrent, severe, with psychosis    Chronic.  Controlled.  Continue with current medication regimen of Zyprexa and Lexapro.  Refills sent today.  Labs ordered today.  Return to clinic in 6 months for reevaluation.  Call sooner if concerns arise.        Other Visit Diagnoses     Annual physical exam    -  Primary   Health maintenance reviewed during visit today.  Labs ordered.  TDAP given. Mammogram up to date. Colonoscopy ordered.   Relevant Orders   CBC with Differential/Platelet   Comprehensive metabolic panel   Lipid panel   TSH   Urinalysis, Routine w reflex microscopic   Screening for ischemic heart disease       Relevant Orders   Lipid panel   Screening for colon cancer       Relevant Orders   Ambulatory referral to Gastroenterology   Need for tetanus booster       Relevant Orders   Tdap vaccine greater than or equal to 7yo IM         Follow up plan: Return in about 6 months (around 03/21/2023) for HTN, HLD, DM2 FU.   LABORATORY TESTING:  - Pap smear: Up to date  IMMUNIZATIONS:   - Tdap: Tetanus vaccination status reviewed: last tetanus booster within 10 years. - Influenza: Postponed to flu season - Pneumovax: up to date - Prevnar: Not  applicable - HPV: Not applicable - Zostavax vaccine: Not applicable  SCREENING: -Mammogram: Ordered today  - Colonoscopy: Ordered today - Bone Density: Not applicable  -Hearing Test: Not applicable  -Spirometry: Not applicable   PATIENT COUNSELING:   Advised to take 1 mg of folate supplement per day if capable of pregnancy.   Sexuality: Discussed sexually transmitted diseases, partner selection, use of condoms, avoidance of unintended pregnancy  and contraceptive alternatives.   Advised to avoid cigarette smoking.  I discussed with the patient that most people either abstain from alcohol or drink within safe limits (<=14/week and <=4 drinks/occasion for males, <=7/weeks and <= 3 drinks/occasion for females) and that the risk for alcohol disorders and other health effects rises proportionally with the number of drinks per week and how often a drinker exceeds daily limits.  Discussed cessation/primary prevention of drug use and availability of treatment for abuse.   Diet: Encouraged to adjust caloric intake to maintain  or achieve ideal body weight, to reduce intake of dietary saturated fat and total fat, to limit sodium intake by avoiding high sodium foods and not adding table salt, and to maintain adequate dietary potassium and calcium preferably from fresh fruits, vegetables, and low-fat dairy products.    stressed the importance of regular exercise  Injury prevention: Discussed safety belts, safety helmets, smoke detector, smoking near bedding or upholstery.   Dental health: Discussed importance of regular tooth brushing, flossing, and dental visits.    NEXT PREVENTATIVE PHYSICAL DUE IN 1 YEAR. Return in about 6 months (around 03/21/2023) for HTN, HLD, DM2 FU.

## 2022-09-19 NOTE — Assessment & Plan Note (Signed)
Chronic.  Controlled.  Continue with current medication regimen of Zyprexa and Lexapro.  Refills sent today.  Labs ordered today.  Return to clinic in 6 months for reevaluation.  Call sooner if concerns arise.  

## 2022-09-20 LAB — CBC WITH DIFFERENTIAL/PLATELET
Basophils Absolute: 0.1 10*3/uL (ref 0.0–0.2)
Basos: 1 %
EOS (ABSOLUTE): 0.2 10*3/uL (ref 0.0–0.4)
Eos: 2 %
Hematocrit: 40.8 % (ref 34.0–46.6)
Hemoglobin: 13.4 g/dL (ref 11.1–15.9)
Immature Grans (Abs): 0 10*3/uL (ref 0.0–0.1)
Immature Granulocytes: 0 %
Lymphocytes Absolute: 2.3 10*3/uL (ref 0.7–3.1)
Lymphs: 27 %
MCH: 29.8 pg (ref 26.6–33.0)
MCHC: 32.8 g/dL (ref 31.5–35.7)
MCV: 91 fL (ref 79–97)
Monocytes Absolute: 0.6 10*3/uL (ref 0.1–0.9)
Monocytes: 8 %
Neutrophils Absolute: 5.3 10*3/uL (ref 1.4–7.0)
Neutrophils: 62 %
Platelets: 223 10*3/uL (ref 150–450)
RBC: 4.49 x10E6/uL (ref 3.77–5.28)
RDW: 15 % (ref 11.7–15.4)
WBC: 8.4 10*3/uL (ref 3.4–10.8)

## 2022-09-20 LAB — COMPREHENSIVE METABOLIC PANEL
ALT: 28 IU/L (ref 0–32)
AST: 18 IU/L (ref 0–40)
Albumin/Globulin Ratio: 1.8 (ref 1.2–2.2)
Albumin: 3.8 g/dL — ABNORMAL LOW (ref 3.9–4.9)
Alkaline Phosphatase: 69 IU/L (ref 44–121)
BUN/Creatinine Ratio: 17 (ref 9–23)
BUN: 11 mg/dL (ref 6–24)
Bilirubin Total: <0.2 mg/dL (ref 0.0–1.2)
CO2: 24 mmol/L (ref 20–29)
Calcium: 8.7 mg/dL (ref 8.7–10.2)
Chloride: 103 mmol/L (ref 96–106)
Creatinine, Ser: 0.64 mg/dL (ref 0.57–1.00)
Globulin, Total: 2.1 g/dL (ref 1.5–4.5)
Glucose: 79 mg/dL (ref 70–99)
Potassium: 3.9 mmol/L (ref 3.5–5.2)
Sodium: 140 mmol/L (ref 134–144)
Total Protein: 5.9 g/dL — ABNORMAL LOW (ref 6.0–8.5)
eGFR: 109 mL/min/{1.73_m2} (ref >59)

## 2022-09-20 LAB — LIPID PANEL
Chol/HDL Ratio: 6.7 ratio — ABNORMAL HIGH (ref 0.0–4.4)
Cholesterol, Total: 234 mg/dL — ABNORMAL HIGH (ref 100–199)
HDL: 35 mg/dL — ABNORMAL LOW (ref >39)
LDL Chol Calc (NIH): 116 mg/dL — ABNORMAL HIGH (ref 0–99)
Triglycerides: 475 mg/dL — ABNORMAL HIGH (ref 0–149)
VLDL Cholesterol Cal: 83 mg/dL — ABNORMAL HIGH (ref 5–40)

## 2022-09-20 LAB — TSH: TSH: 2.1 u[IU]/mL (ref 0.450–4.500)

## 2022-09-20 NOTE — Progress Notes (Signed)
Please let patient know that her cholesterol is elevated.  Her cardiac risk score puts her at high risk of having a stroke or heart attack over the next 10 years.  I recommend that she start a statin called crestor 5mg  daily.  The goal will be to increase this to 20mg  daily if patient tolerates it well.  If she agrees to the medication I can send it to the pharmacy.    The 10-year ASCVD risk score (Arnett DK, et al., 2019) is: 7.5%   Values used to calculate the score:     Age: 47 years     Sex: Female     Is Non-Hispanic African American: No     Diabetic: No     Tobacco smoker: Yes     Systolic Blood Pressure: 129 mmHg     Is BP treated: No     HDL Cholesterol: 35 mg/dL     Total Cholesterol: 234 mg/dL

## 2022-09-21 ENCOUNTER — Other Ambulatory Visit: Payer: Self-pay

## 2022-09-21 ENCOUNTER — Telehealth: Payer: Self-pay

## 2022-09-21 DIAGNOSIS — Z1211 Encounter for screening for malignant neoplasm of colon: Secondary | ICD-10-CM

## 2022-09-21 MED ORDER — NA SULFATE-K SULFATE-MG SULF 17.5-3.13-1.6 GM/177ML PO SOLN: 1.0000 | Freq: Once | ORAL | 0 refills | Status: AC

## 2022-09-21 NOTE — Telephone Encounter (Signed)
Gastroenterology Pre-Procedure Review  Request Date: 11/10/22 Requesting Physician: Dr. Allegra Lai  PATIENT REVIEW QUESTIONS: The patient responded to the following health history questions as indicated:    1. Are you having any GI issues? no 2. Do you have a personal history of Polyps? no 3. Do you have a family history of Colon Cancer or Polyps? no 4. Diabetes Mellitus? no 5. Joint replacements in the past 12 months?no 6. Major health problems in the past 3 months?no 7. Any artificial heart valves, MVP, or defibrillator?no    MEDICATIONS & ALLERGIES:    Patient reports the following regarding taking any anticoagulation/antiplatelet therapy:   Plavix, Coumadin, Eliquis, Xarelto, Lovenox, Pradaxa, Brilinta, or Effient? no Aspirin? no  Patient confirms/reports the following medications:  Current Outpatient Medications  Medication Sig Dispense Refill   escitalopram (LEXAPRO) 20 MG tablet Take 1 tablet (20 mg total) by mouth at bedtime. OFFICE VISIT NEEDED FOR ADDITIONAL REFILLS 90 tablet 1   OLANZapine zydis (ZYPREXA) 20 MG disintegrating tablet Take 1 tablet (20 mg total) by mouth at bedtime. 90 tablet 1   No current facility-administered medications for this visit.    Patient confirms/reports the following allergies:  Allergies  Allergen Reactions   Amoxicillin Hives   Penicillins Hives and Nausea Only    No orders of the defined types were placed in this encounter.   AUTHORIZATION INFORMATION Primary Insurance: 1D#: Group #:  Secondary Insurance: 1D#: Group #:  SCHEDULE INFORMATION: Date: 11/10/22 Time: Location: [facility]

## 2022-09-26 MED ORDER — ROSUVASTATIN CALCIUM 5 MG PO TABS: 5.0000 mg | ORAL_TABLET | Freq: Every day | ORAL | 1 refills | Status: DC

## 2022-09-26 NOTE — Progress Notes (Signed)
Medication sent to the pharmacy.

## 2022-09-26 NOTE — Addendum Note (Signed)
Addended by: Larae Grooms on: 09/26/2022 11:56 AM   Modules accepted: Orders

## 2022-10-16 ENCOUNTER — Encounter (HOSPITAL_COMMUNITY): Payer: Self-pay

## 2022-11-09 ENCOUNTER — Encounter: Payer: Self-pay | Admitting: Gastroenterology

## 2022-11-10 ENCOUNTER — Ambulatory Visit
Admission: RE | Admit: 2022-11-10 | Payer: BC Managed Care – PPO | Source: Home / Self Care | Admitting: Gastroenterology

## 2022-11-10 ENCOUNTER — Encounter: Admission: RE | Payer: Self-pay | Source: Home / Self Care

## 2022-11-10 SURGERY — COLONOSCOPY WITH PROPOFOL
Anesthesia: General

## 2023-01-18 ENCOUNTER — Ambulatory Visit: Payer: BC Managed Care – PPO | Admitting: Physician Assistant

## 2023-03-21 ENCOUNTER — Encounter: Payer: Self-pay | Admitting: Nurse Practitioner

## 2023-03-21 ENCOUNTER — Ambulatory Visit: Payer: BC Managed Care – PPO | Admitting: Nurse Practitioner

## 2023-03-21 VITALS — BP 135/81 | HR 69 | Wt 178.4 lb

## 2023-03-21 DIAGNOSIS — F1010 Alcohol abuse, uncomplicated: Secondary | ICD-10-CM

## 2023-03-21 DIAGNOSIS — F418 Other specified anxiety disorders: Secondary | ICD-10-CM

## 2023-03-21 DIAGNOSIS — E7800 Pure hypercholesterolemia, unspecified: Secondary | ICD-10-CM

## 2023-03-21 DIAGNOSIS — F333 Major depressive disorder, recurrent, severe with psychotic symptoms: Secondary | ICD-10-CM | POA: Diagnosis not present

## 2023-03-21 DIAGNOSIS — R5383 Other fatigue: Secondary | ICD-10-CM

## 2023-03-21 MED ORDER — ESCITALOPRAM OXALATE 20 MG PO TABS: 20.0000 mg | ORAL_TABLET | Freq: Every day | ORAL | 1 refills | Status: DC

## 2023-03-21 MED ORDER — OLANZAPINE 20 MG PO TBDP: 20.0000 mg | ORAL_TABLET | Freq: Every day | ORAL | 1 refills | Status: DC

## 2023-03-21 MED ORDER — ROSUVASTATIN CALCIUM 5 MG PO TABS: 5.0000 mg | ORAL_TABLET | Freq: Every day | ORAL | 1 refills | Status: DC

## 2023-03-21 NOTE — Assessment & Plan Note (Signed)
Patient endorses no alcohol consumption.

## 2023-03-21 NOTE — Progress Notes (Signed)
BP 135/81 (BP Location: Right Arm, Cuff Size: Normal)   Pulse 69   Wt 178 lb 6.4 oz (80.9 kg)   SpO2 98%   BMI 31.60 kg/m    Subjective:    Patient ID: Cristina Finley, female    DOB: July 14, 1975, 47 y.o.   MRN: [id number]  HPI: Cristina Finley is a 47 y.o. female  Chief Complaint  Patient presents with   Hyperlipidemia   Diabetes   Hypertension   ANXIETY/DEPRESSION Patient states her Zyprexa and Lexapro are working well for her.  Feels like these are good doses.  She would like to get her medications from here and no longer see the psychiatrist.   Denies SI.      03/21/2023    1:09 PM 09/19/2022    2:48 PM 01/16/2022   11:13 AM 12/16/2021    1:13 PM  GAD 7 : Generalized Anxiety Score  Nervous, Anxious, on Edge 1 1 1 1   Control/stop worrying 1 2 1  0  Worry too much - different things 1 2 1  0  Trouble relaxing 1 1 0 0  Restless 1 1 0 0  Easily annoyed or irritable 2 1 1 1   Afraid - awful might happen 0 1 0 1  Total GAD 7 Score 7 9 4 3   Anxiety Difficulty  Somewhat difficult Not difficult at all Not difficult at all    [facility] Visit from 03/21/2023 in [facility]  PHQ-9 Total Score 9       HYPERLIPIDEMIA Hyperlipidemia status: excellent compliance Satisfied with current treatment?  yes Side effects:  no Medication compliance: excellent compliance Past cholesterol meds: rosuvastatin (crestor) Supplements: none Aspirin:  no The 10-year ASCVD risk score (Arnett DK, et al., 2019) is: 8.2%   Values used to calculate the score:     Age: 3 years     Sex: Female     Is Non-Hispanic African American: No     Diabetic: No     Tobacco smoker: Yes     Systolic Blood Pressure: 135 mmHg     Is BP treated: No     HDL Cholesterol: 35 mg/dL     Total Cholesterol: 234 mg/dL Chest pain:  no Coronary artery disease:  no Family history CAD:  no Family history early CAD:  no  FATIGUE Duration:  months Severity: 5/10  Onset:  gradual Context when symptoms started:  unknown Symptoms improve with rest: yes  Depressive symptoms:  yes Stress/anxiety: yes Insomnia: yes hard to stay asleep Snoring: yes Observed apnea by bed partner: no Daytime hypersomnolence:no Wakes feeling refreshed: no History of sleep study: no Dysnea on exertion:  no Orthopnea/PND: no Chest pain: no Chronic cough: no Lower extremity edema: no Arthralgias:no Myalgias: no Weakness: no Rash: no    Relevant past medical, surgical, family and social history reviewed and updated as indicated. Interim medical history since our last visit reviewed. Allergies and medications reviewed and updated.  Review of Systems  Constitutional:  Positive for fatigue.  Psychiatric/Behavioral:  Positive for dysphoric mood. Negative for suicidal ideas. The patient is nervous/anxious.     Per HPI unless specifically indicated above     Objective:    BP 135/81 (BP Location: Right Arm, Cuff Size: Normal)   Pulse 69   Wt 178 lb 6.4 oz (80.9 kg)   SpO2 98%   BMI 31.60 kg/m   Wt Readings from Last 3 Encounters:  03/21/23 178 lb 6.4 oz (80.9 kg)  09/19/22 182 lb 9.6 oz (82.8 kg)  01/16/22 160 lb 4.8 oz (72.7 kg)    Physical Exam Vitals and nursing note reviewed.  Constitutional:      General: She is not in acute distress.    Appearance: Normal appearance. She is normal weight. She is not ill-appearing, toxic-appearing or diaphoretic.  HENT:     Head: Normocephalic.     Right Ear: External ear normal.     Left Ear: External ear normal.     Nose: Nose normal.     Mouth/Throat:     Mouth: Mucous membranes are moist.     Pharynx: Oropharynx is clear.  Eyes:     General:        Right eye: No discharge.        Left eye: No discharge.     Extraocular Movements: Extraocular movements intact.     Conjunctiva/sclera: Conjunctivae normal.     Pupils: Pupils are equal, round, and reactive to light.  Cardiovascular:     Rate and Rhythm: Normal rate  and regular rhythm.     Heart sounds: No murmur heard. Pulmonary:     Effort: Pulmonary effort is normal. No respiratory distress.     Breath sounds: Normal breath sounds. No wheezing or rales.  Musculoskeletal:     Cervical back: Normal range of motion and neck supple.  Skin:    General: Skin is warm and dry.     Capillary Refill: Capillary refill takes less than 2 seconds.  Neurological:     General: No focal deficit present.     Mental Status: She is alert and oriented to person, place, and time. Mental status is at baseline.  Psychiatric:        Mood and Affect: Mood normal.        Behavior: Behavior normal.        Thought Content: Thought content normal.        Judgment: Judgment normal.     Results for orders placed or performed in visit on 09/19/22  CBC with Differential/Platelet  Result Value Ref Range   WBC 8.4 3.4 - 10.8 x10E3/uL   RBC 4.49 3.77 - 5.28 x10E6/uL   Hemoglobin 13.4 11.1 - 15.9 g/dL   Hematocrit 78.2 95.6 - 46.6 %   MCV 91 79 - 97 fL   MCH 29.8 26.6 - 33.0 pg   MCHC 32.8 31.5 - 35.7 g/dL   RDW 21.3 08.6 - 57.8 %   Platelets 223 150 - 450 x10E3/uL   Neutrophils 62 Not Estab. %   Lymphs 27 Not Estab. %   Monocytes 8 Not Estab. %   Eos 2 Not Estab. %   Basos 1 Not Estab. %   Neutrophils Absolute 5.3 1.4 - 7.0 x10E3/uL   Lymphocytes Absolute 2.3 0.7 - 3.1 x10E3/uL   Monocytes Absolute 0.6 0.1 - 0.9 x10E3/uL   EOS (ABSOLUTE) 0.2 0.0 - 0.4 x10E3/uL   Basophils Absolute 0.1 0.0 - 0.2 x10E3/uL   Immature Granulocytes 0 Not Estab. %   Immature Grans (Abs) 0.0 0.0 - 0.1 x10E3/uL  Comprehensive metabolic panel  Result Value Ref Range   Glucose 79 70 - 99 mg/dL   BUN 11 6 - 24 mg/dL   Creatinine, Ser 4.69 0.57 - 1.00 mg/dL   eGFR 629 >52 WU/XLK/4.40   BUN/Creatinine Ratio 17 9 - 23   Sodium 140 134 - 144 mmol/L   Potassium 3.9 3.5 - 5.2 mmol/L   Chloride 103 96 - 106  mmol/L   CO2 24 20 - 29 mmol/L   Calcium 8.7 8.7 - 10.2 mg/dL   Total Protein 5.9  (L) 6.0 - 8.5 g/dL   Albumin 3.8 (L) 3.9 - 4.9 g/dL   Globulin, Total 2.1 1.5 - 4.5 g/dL   Albumin/Globulin Ratio 1.8 1.2 - 2.2   Bilirubin Total <0.2 0.0 - 1.2 mg/dL   Alkaline Phosphatase 69 44 - 121 IU/L   AST 18 0 - 40 IU/L   ALT 28 0 - 32 IU/L  Lipid panel  Result Value Ref Range   Cholesterol, Total 234 (H) 100 - 199 mg/dL   Triglycerides 409 (H) 0 - 149 mg/dL   HDL 35 (L) >81 mg/dL   VLDL Cholesterol Cal 83 (H) 5 - 40 mg/dL   LDL Chol Calc (NIH) 191 (H) 0 - 99 mg/dL   Chol/HDL Ratio 6.7 (H) 0.0 - 4.4 ratio  TSH  Result Value Ref Range   TSH 2.100 0.450 - 4.500 uIU/mL  Urinalysis, Routine w reflex microscopic  Result Value Ref Range   Specific Gravity, UA 1.010 1.005 - 1.030   pH, UA 6.0 5.0 - 7.5   Color, UA Yellow Yellow   Appearance Ur Cloudy (A) Clear   Leukocytes,UA Negative Negative   Protein,UA Negative Negative/Trace   Glucose, UA Negative Negative   Ketones, UA Negative Negative   RBC, UA Negative Negative   Bilirubin, UA Negative Negative   Urobilinogen, Ur 0.2 0.2 - 1.0 mg/dL   Nitrite, UA Negative Negative   Microscopic Examination Comment       Assessment & Plan:   Problem List Items Addressed This Visit       Other   Depression with anxiety    Chronic.  Controlled.  Continue with current medication regimen of Zyprexa and Lexapro.  Patient feels down and has no energy.   Referral placed for medication adjustment with psychiatry.  Refills sent today.  Labs ordered today.  Return to clinic in 6 months for reevaluation.  Call sooner if concerns arise.       Relevant Medications   escitalopram (LEXAPRO) 20 MG tablet   Alcohol abuse    Patient endorses no alcohol consumption.      Relevant Orders   Comp Met (CMET)   MDD (major depressive disorder), recurrent, severe, with psychosis (HCC) - Primary    Chronic.  Controlled.  Continue with current medication regimen of Zyprexa and Lexapro.  Patient feels down and has no energy.   Referral placed for  medication adjustment with psychiatry.  Refills sent today.  Labs ordered today.  Return to clinic in 6 months for reevaluation.  Call sooner if concerns arise.        Relevant Medications   escitalopram (LEXAPRO) 20 MG tablet   Other Relevant Orders   Ambulatory referral to Psychiatry   Other Visit Diagnoses     Hypercholesteremia       Relevant Medications   rosuvastatin (CRESTOR) 5 MG tablet   Other Relevant Orders   Lipid Profile   Other fatigue       Labs ordered to rule out cause fatigue. Suspect it is related to depression.  Patient referred to psychiatry for medication management.   Relevant Orders   Thyroid Panel With TSH   Thyroid peroxidase antibody   Vitamin D (25 hydroxy)        Follow up plan: Return in about 6 months (around 09/19/2023) for Physical and Fasting labs.

## 2023-03-21 NOTE — Assessment & Plan Note (Signed)
Chronic.  Controlled.  Continue with current medication regimen of Zyprexa and Lexapro.  Patient feels down and has no energy.   Referral placed for medication adjustment with psychiatry.  Refills sent today.  Labs ordered today.  Return to clinic in 6 months for reevaluation.  Call sooner if concerns arise.

## 2023-03-22 LAB — THYROID PANEL WITH TSH
Free Thyroxine Index: 1.6 (ref 1.2–4.9)
T3 Uptake Ratio: 26 % (ref 24–39)
T4, Total: 6.2 ug/dL (ref 4.5–12.0)
TSH: 1.26 u[IU]/mL (ref 0.450–4.500)

## 2023-03-22 LAB — COMPREHENSIVE METABOLIC PANEL
ALT: 18 [IU]/L (ref 0–32)
AST: 13 [IU]/L (ref 0–40)
Albumin: 4.2 g/dL (ref 3.9–4.9)
Alkaline Phosphatase: 80 [IU]/L (ref 44–121)
BUN/Creatinine Ratio: 21 (ref 9–23)
BUN: 18 mg/dL (ref 6–24)
Bilirubin Total: <0.2 mg/dL (ref 0.0–1.2)
CO2: 23 mmol/L (ref 20–29)
Calcium: 9.6 mg/dL (ref 8.7–10.2)
Chloride: 102 mmol/L (ref 96–106)
Creatinine, Ser: 0.87 mg/dL (ref 0.57–1.00)
Globulin, Total: 2.6 g/dL (ref 1.5–4.5)
Glucose: 97 mg/dL (ref 70–99)
Potassium: 4.8 mmol/L (ref 3.5–5.2)
Sodium: 140 mmol/L (ref 134–144)
Total Protein: 6.8 g/dL (ref 6.0–8.5)
eGFR: 82 mL/min/{1.73_m2} (ref >59)

## 2023-03-22 LAB — VITAMIN D 25 HYDROXY (VIT D DEFICIENCY, FRACTURES): Vit D, 25-Hydroxy: 24.6 ng/mL — ABNORMAL LOW (ref 30.0–100.0)

## 2023-03-22 LAB — LIPID PANEL
Chol/HDL Ratio: 4.5 ratio — ABNORMAL HIGH (ref 0.0–4.4)
Cholesterol, Total: 209 mg/dL — ABNORMAL HIGH (ref 100–199)
HDL: 46 mg/dL
LDL Chol Calc (NIH): 138 mg/dL — ABNORMAL HIGH (ref 0–99)
Triglycerides: 137 mg/dL (ref 0–149)
VLDL Cholesterol Cal: 25 mg/dL (ref 5–40)

## 2023-03-22 LAB — THYROID PEROXIDASE ANTIBODY: Thyroperoxidase Ab SerPl-aCnc: 43 [IU]/mL — ABNORMAL HIGH (ref 0–34)

## 2023-03-22 MED ORDER — VITAMIN D (ERGOCALCIFEROL) 1.25 MG (50000 UNIT) PO CAPS
50000.0000 [IU] | ORAL_CAPSULE | ORAL | 0 refills | Status: AC
Start: 2023-03-22 — End: ?

## 2023-03-22 NOTE — Progress Notes (Signed)
Please let patient know that her lab work shows that her cholesterol is improved from prior.  I recommend continuing with a low fat diet and exercise.  Her thyroid antibodies are elevated but other thyroid labs are within normal range.  We will recheck this at her next visit.  Her vitamin D is low which could be contributing to her fatigue.  I have sent in a prescription for vitamin D 50,000 international units once weekly for 12 weeks.  Then she should start vitamin D 1000 international units  once daily.  I would like her to come back in 3 months and repeat the labs.

## 2023-03-22 NOTE — Addendum Note (Signed)
Addended by: Larae Grooms on: 03/22/2023 08:40 AM   Modules accepted: Orders

## 2023-06-27 ENCOUNTER — Ambulatory Visit: Payer: Self-pay | Admitting: Nurse Practitioner

## 2023-06-27 NOTE — Progress Notes (Deleted)
There were no vitals taken for this visit.   Subjective:    Patient ID: Cristina Finley, female    DOB: 10/03/1975, 48 y.o.   MRN: [id number]  HPI: Cristina Finley is a 48 y.o. female  No chief complaint on file.  ANXIETY/DEPRESSION Patient states her Zyprexa and Lexapro are working well for her.  Feels like these are good doses.  She would like to get her medications from here and no longer see the psychiatrist.   Denies SI.      03/21/2023    1:09 PM 09/19/2022    2:48 PM 01/16/2022   11:13 AM 12/16/2021    1:13 PM  GAD 7 : Generalized Anxiety Score  Nervous, Anxious, on Edge 1 1 1 1   Control/stop worrying 1 2 1  0  Worry too much - different things 1 2 1  0  Trouble relaxing 1 1 0 0  Restless 1 1 0 0  Easily annoyed or irritable 2 1 1 1   Afraid - awful might happen 0 1 0 1  Total GAD 7 Score 7 9 4 3   Anxiety Difficulty  Somewhat difficult Not difficult at all Not difficult at all    [facility] Visit from 03/21/2023 in [facility]  PHQ-9 Total Score 9       HYPERLIPIDEMIA Hyperlipidemia status: excellent compliance Satisfied with current treatment?  yes Side effects:  no Medication compliance: excellent compliance Past cholesterol meds: rosuvastatin (crestor) Supplements: none Aspirin:  no The 10-year ASCVD risk score (Arnett DK, et al., 2019) is: 5%   Values used to calculate the score:     Age: 19 years     Sex: Female     Is Non-Hispanic African American: No     Diabetic: No     Tobacco smoker: Yes     Systolic Blood Pressure: 135 mmHg     Is BP treated: No     HDL Cholesterol: 46 mg/dL     Total Cholesterol: 209 mg/dL Chest pain:  no Coronary artery disease:  no Family history CAD:  no Family history early CAD:  no  FATIGUE Duration:  months Severity: 5/10  Onset: gradual Context when symptoms started:  unknown Symptoms improve with rest: yes  Depressive symptoms:  yes Stress/anxiety: yes Insomnia: yes hard to stay  asleep Snoring: yes Observed apnea by bed partner: no Daytime hypersomnolence:no Wakes feeling refreshed: no History of sleep study: no Dysnea on exertion:  no Orthopnea/PND: no Chest pain: no Chronic cough: no Lower extremity edema: no Arthralgias:no Myalgias: no Weakness: no Rash: no    Relevant past medical, surgical, family and social history reviewed and updated as indicated. Interim medical history since our last visit reviewed. Allergies and medications reviewed and updated.  Review of Systems  Constitutional:  Positive for fatigue.  Psychiatric/Behavioral:  Positive for dysphoric mood. Negative for suicidal ideas. The patient is nervous/anxious.     Per HPI unless specifically indicated above     Objective:    There were no vitals taken for this visit.  Wt Readings from Last 3 Encounters:  03/21/23 178 lb 6.4 oz (80.9 kg)  09/19/22 182 lb 9.6 oz (82.8 kg)  01/16/22 160 lb 4.8 oz (72.7 kg)    Physical Exam Vitals and nursing note reviewed.  Constitutional:      General: She is not in acute distress.    Appearance: Normal appearance. She is normal weight. She is not ill-appearing, toxic-appearing or diaphoretic.  HENT:  Head: Normocephalic.     Right Ear: External ear normal.     Left Ear: External ear normal.     Nose: Nose normal.     Mouth/Throat:     Mouth: Mucous membranes are moist.     Pharynx: Oropharynx is clear.  Eyes:     General:        Right eye: No discharge.        Left eye: No discharge.     Extraocular Movements: Extraocular movements intact.     Conjunctiva/sclera: Conjunctivae normal.     Pupils: Pupils are equal, round, and reactive to light.  Cardiovascular:     Rate and Rhythm: Normal rate and regular rhythm.     Heart sounds: No murmur heard. Pulmonary:     Effort: Pulmonary effort is normal. No respiratory distress.     Breath sounds: Normal breath sounds. No wheezing or rales.  Musculoskeletal:     Cervical back: Normal  range of motion and neck supple.  Skin:    General: Skin is warm and dry.     Capillary Refill: Capillary refill takes less than 2 seconds.  Neurological:     General: No focal deficit present.     Mental Status: She is alert and oriented to person, place, and time. Mental status is at baseline.  Psychiatric:        Mood and Affect: Mood normal.        Behavior: Behavior normal.        Thought Content: Thought content normal.        Judgment: Judgment normal.     Results for orders placed or performed in visit on 03/21/23  Comp Met (CMET)   Collection Time: 03/21/23  1:19 PM  Result Value Ref Range   Glucose 97 70 - 99 mg/dL   BUN 18 6 - 24 mg/dL   Creatinine, Ser 9.60 0.57 - 1.00 mg/dL   eGFR 82 >45 WU/JWJ/1.91   BUN/Creatinine Ratio 21 9 - 23   Sodium 140 134 - 144 mmol/L   Potassium 4.8 3.5 - 5.2 mmol/L   Chloride 102 96 - 106 mmol/L   CO2 23 20 - 29 mmol/L   Calcium 9.6 8.7 - 10.2 mg/dL   Total Protein 6.8 6.0 - 8.5 g/dL   Albumin 4.2 3.9 - 4.9 g/dL   Globulin, Total 2.6 1.5 - 4.5 g/dL   Bilirubin Total <4.7 0.0 - 1.2 mg/dL   Alkaline Phosphatase 80 44 - 121 IU/L   AST 13 0 - 40 IU/L   ALT 18 0 - 32 IU/L  Lipid Profile   Collection Time: 03/21/23  1:19 PM  Result Value Ref Range   Cholesterol, Total 209 (H) 100 - 199 mg/dL   Triglycerides 829 0 - 149 mg/dL   HDL 46 >56 mg/dL   VLDL Cholesterol Cal 25 5 - 40 mg/dL   LDL Chol Calc (NIH) 213 (H) 0 - 99 mg/dL   Chol/HDL Ratio 4.5 (H) 0.0 - 4.4 ratio  Thyroid Panel With TSH   Collection Time: 03/21/23  1:19 PM  Result Value Ref Range   TSH 1.260 0.450 - 4.500 uIU/mL   T4, Total 6.2 4.5 - 12.0 ug/dL   T3 Uptake Ratio 26 24 - 39 %   Free Thyroxine Index 1.6 1.2 - 4.9  Thyroid peroxidase antibody   Collection Time: 03/21/23  1:19 PM  Result Value Ref Range   Thyroperoxidase Ab SerPl-aCnc 43 (H) 0 - 34 IU/mL  Vitamin  D (25 hydroxy)   Collection Time: 03/21/23  1:19 PM  Result Value Ref Range   Vit D, 25-Hydroxy  24.6 (L) 30.0 - 100.0 ng/mL      Assessment & Plan:   Problem List Items Addressed This Visit   None     Follow up plan: No follow-ups on file.

## 2023-09-04 ENCOUNTER — Ambulatory Visit: Payer: Self-pay | Admitting: Nurse Practitioner

## 2023-09-04 DIAGNOSIS — E7800 Pure hypercholesterolemia, unspecified: Secondary | ICD-10-CM | POA: Insufficient documentation

## 2023-09-04 DIAGNOSIS — R7989 Other specified abnormal findings of blood chemistry: Secondary | ICD-10-CM | POA: Insufficient documentation

## 2023-09-04 NOTE — Progress Notes (Deleted)
 There were no vitals taken for this visit.   Subjective:    Patient ID: Cristina Finley, female    DOB: 10/03/1975, 48 y.o.   MRN: [id number]  HPI: Cristina Finley is a 48 y.o. female  No chief complaint on file.  ANXIETY/DEPRESSION Patient states her Zyprexa and Lexapro are working well for her.  Feels like these are good doses.  She would like to get her medications from here and no longer see the psychiatrist.   Denies SI.      03/21/2023    1:09 PM 09/19/2022    2:48 PM 01/16/2022   11:13 AM 12/16/2021    1:13 PM  GAD 7 : Generalized Anxiety Score  Nervous, Anxious, on Edge 1 1 1 1   Control/stop worrying 1 2 1  0  Worry too much - different things 1 2 1  0  Trouble relaxing 1 1 0 0  Restless 1 1 0 0  Easily annoyed or irritable 2 1 1 1   Afraid - awful might happen 0 1 0 1  Total GAD 7 Score 7 9 4 3   Anxiety Difficulty  Somewhat difficult Not difficult at all Not difficult at all    [facility] Visit from 03/21/2023 in [facility]  PHQ-9 Total Score 9       HYPERLIPIDEMIA Hyperlipidemia status: excellent compliance Satisfied with current treatment?  yes Side effects:  no Medication compliance: excellent compliance Past cholesterol meds: rosuvastatin (crestor) Supplements: none Aspirin:  no The 10-year ASCVD risk score (Arnett DK, et al., 2019) is: 5%   Values used to calculate the score:     Age: 19 years     Sex: Female     Is Non-Hispanic African American: No     Diabetic: No     Tobacco smoker: Yes     Systolic Blood Pressure: 135 mmHg     Is BP treated: No     HDL Cholesterol: 46 mg/dL     Total Cholesterol: 209 mg/dL Chest pain:  no Coronary artery disease:  no Family history CAD:  no Family history early CAD:  no  FATIGUE Duration:  months Severity: 5/10  Onset: gradual Context when symptoms started:  unknown Symptoms improve with rest: yes  Depressive symptoms:  yes Stress/anxiety: yes Insomnia: yes hard to stay  asleep Snoring: yes Observed apnea by bed partner: no Daytime hypersomnolence:no Wakes feeling refreshed: no History of sleep study: no Dysnea on exertion:  no Orthopnea/PND: no Chest pain: no Chronic cough: no Lower extremity edema: no Arthralgias:no Myalgias: no Weakness: no Rash: no    Relevant past medical, surgical, family and social history reviewed and updated as indicated. Interim medical history since our last visit reviewed. Allergies and medications reviewed and updated.  Review of Systems  Constitutional:  Positive for fatigue.  Psychiatric/Behavioral:  Positive for dysphoric mood. Negative for suicidal ideas. The patient is nervous/anxious.     Per HPI unless specifically indicated above     Objective:    There were no vitals taken for this visit.  Wt Readings from Last 3 Encounters:  03/21/23 178 lb 6.4 oz (80.9 kg)  09/19/22 182 lb 9.6 oz (82.8 kg)  01/16/22 160 lb 4.8 oz (72.7 kg)    Physical Exam Vitals and nursing note reviewed.  Constitutional:      General: She is not in acute distress.    Appearance: Normal appearance. She is normal weight. She is not ill-appearing, toxic-appearing or diaphoretic.  HENT:  Head: Normocephalic.     Right Ear: External ear normal.     Left Ear: External ear normal.     Nose: Nose normal.     Mouth/Throat:     Mouth: Mucous membranes are moist.     Pharynx: Oropharynx is clear.  Eyes:     General:        Right eye: No discharge.        Left eye: No discharge.     Extraocular Movements: Extraocular movements intact.     Conjunctiva/sclera: Conjunctivae normal.     Pupils: Pupils are equal, round, and reactive to light.  Cardiovascular:     Rate and Rhythm: Normal rate and regular rhythm.     Heart sounds: No murmur heard. Pulmonary:     Effort: Pulmonary effort is normal. No respiratory distress.     Breath sounds: Normal breath sounds. No wheezing or rales.  Musculoskeletal:     Cervical back: Normal  range of motion and neck supple.  Skin:    General: Skin is warm and dry.     Capillary Refill: Capillary refill takes less than 2 seconds.  Neurological:     General: No focal deficit present.     Mental Status: She is alert and oriented to person, place, and time. Mental status is at baseline.  Psychiatric:        Mood and Affect: Mood normal.        Behavior: Behavior normal.        Thought Content: Thought content normal.        Judgment: Judgment normal.     Results for orders placed or performed in visit on 03/21/23  Comp Met (CMET)   Collection Time: 03/21/23  1:19 PM  Result Value Ref Range   Glucose 97 70 - 99 mg/dL   BUN 18 6 - 24 mg/dL   Creatinine, Ser 9.60 0.57 - 1.00 mg/dL   eGFR 82 >45 WU/JWJ/1.91   BUN/Creatinine Ratio 21 9 - 23   Sodium 140 134 - 144 mmol/L   Potassium 4.8 3.5 - 5.2 mmol/L   Chloride 102 96 - 106 mmol/L   CO2 23 20 - 29 mmol/L   Calcium 9.6 8.7 - 10.2 mg/dL   Total Protein 6.8 6.0 - 8.5 g/dL   Albumin 4.2 3.9 - 4.9 g/dL   Globulin, Total 2.6 1.5 - 4.5 g/dL   Bilirubin Total <4.7 0.0 - 1.2 mg/dL   Alkaline Phosphatase 80 44 - 121 IU/L   AST 13 0 - 40 IU/L   ALT 18 0 - 32 IU/L  Lipid Profile   Collection Time: 03/21/23  1:19 PM  Result Value Ref Range   Cholesterol, Total 209 (H) 100 - 199 mg/dL   Triglycerides 829 0 - 149 mg/dL   HDL 46 >56 mg/dL   VLDL Cholesterol Cal 25 5 - 40 mg/dL   LDL Chol Calc (NIH) 213 (H) 0 - 99 mg/dL   Chol/HDL Ratio 4.5 (H) 0.0 - 4.4 ratio  Thyroid Panel With TSH   Collection Time: 03/21/23  1:19 PM  Result Value Ref Range   TSH 1.260 0.450 - 4.500 uIU/mL   T4, Total 6.2 4.5 - 12.0 ug/dL   T3 Uptake Ratio 26 24 - 39 %   Free Thyroxine Index 1.6 1.2 - 4.9  Thyroid peroxidase antibody   Collection Time: 03/21/23  1:19 PM  Result Value Ref Range   Thyroperoxidase Ab SerPl-aCnc 43 (H) 0 - 34 IU/mL  Vitamin  D (25 hydroxy)   Collection Time: 03/21/23  1:19 PM  Result Value Ref Range   Vit D, 25-Hydroxy  24.6 (L) 30.0 - 100.0 ng/mL      Assessment & Plan:   Problem List Items Addressed This Visit   None     Follow up plan: No follow-ups on file.

## 2023-09-17 ENCOUNTER — Ambulatory Visit: Payer: Self-pay | Admitting: Nurse Practitioner

## 2023-09-17 ENCOUNTER — Encounter: Payer: Self-pay | Admitting: Nurse Practitioner

## 2023-09-17 VITALS — BP 138/82 | HR 60 | Temp 97.2°F | Ht 63.0 in | Wt 171.0 lb

## 2023-09-17 DIAGNOSIS — M25562 Pain in left knee: Secondary | ICD-10-CM | POA: Diagnosis not present

## 2023-09-17 DIAGNOSIS — R7989 Other specified abnormal findings of blood chemistry: Secondary | ICD-10-CM

## 2023-09-17 DIAGNOSIS — F333 Major depressive disorder, recurrent, severe with psychotic symptoms: Secondary | ICD-10-CM

## 2023-09-17 DIAGNOSIS — E7800 Pure hypercholesterolemia, unspecified: Secondary | ICD-10-CM

## 2023-09-17 MED ORDER — OLANZAPINE 20 MG PO TBDP
20.0000 mg | ORAL_TABLET | Freq: Every day | ORAL | 1 refills | Status: DC
Start: 2023-09-17 — End: 2024-01-07

## 2023-09-17 MED ORDER — METHYLPREDNISOLONE 4 MG PO TBPK: ORAL_TABLET | ORAL | 0 refills | Status: DC

## 2023-09-17 MED ORDER — ESCITALOPRAM OXALATE 20 MG PO TABS: 20.0000 mg | ORAL_TABLET | Freq: Every day | ORAL | 1 refills | Status: DC

## 2023-09-17 MED ORDER — ROSUVASTATIN CALCIUM 5 MG PO TABS: 5.0000 mg | ORAL_TABLET | Freq: Every day | ORAL | 1 refills | Status: DC

## 2023-09-17 NOTE — Assessment & Plan Note (Signed)
 Chronic.  Controlled.  Continue with current medication regimen of Zyprexa and Lexapro.  PHQ9 is elevated but patient feels like she is doing well.  Refills sent today.  Labs ordered today.  Return to clinic in 6 months for reevaluation.  Call sooner if concerns arise.

## 2023-09-17 NOTE — Progress Notes (Signed)
 BP 138/82   Pulse 60   Temp (!) 97.2 F (36.2 C) (Oral)   Ht 5\' 3"  (1.6 m)   Wt 171 lb (77.6 kg)   LMP 08/21/2023   SpO2 97%   BMI 30.29 kg/m    Subjective:    Patient ID: Cristina Finley, female    DOB: Dec 06, 1975, 48 y.o.   MRN: [id number]  HPI: Cristina Finley is a 48 y.o. female  Chief Complaint  Patient presents with   Depression   ANXIETY/DEPRESSION Patient states her Zyprexa and Lexapro are working okay for her.  She is due for refills today.   Feels like these are good doses.  She would like to get her medications from here and no longer see the psychiatrist.   Denies SI.       09/17/2023   11:07 AM 03/21/2023    1:09 PM 09/19/2022    2:48 PM 01/16/2022   11:13 AM  GAD 7 : Generalized Anxiety Score  Nervous, Anxious, on Edge 2 1 1 1   Control/stop worrying 2 1 2 1   Worry too much - different things 2 1 2 1   Trouble relaxing 2 1 1  0  Restless 1 1 1  0  Easily annoyed or irritable 2 2 1 1   Afraid - awful might happen 1 0 1 0  Total GAD 7 Score 12 7 9 4   Anxiety Difficulty   Somewhat difficult Not difficult at all    Flowsheet Row Office Visit from 09/17/2023 in [facility]  PHQ-9 Total Score 13       HYPERLIPIDEMIA Hyperlipidemia status: excellent compliance Satisfied with current treatment?  yes Side effects:  no Medication compliance: excellent compliance Past cholesterol meds: rosuvastatin (crestor) Supplements: none Aspirin:  no The 10-year ASCVD risk score (Arnett DK, et al., 2019) is: 5.2%   Values used to calculate the score:     Age: 45 years     Sex: Female     Is Non-Hispanic African American: No     Diabetic: No     Tobacco smoker: Yes     Systolic Blood Pressure: 138 mmHg     Is BP treated: No     HDL Cholesterol: 46 mg/dL     Total Cholesterol: 209 mg/dL Chest pain:  no Coronary artery disease:  no Family history CAD:  no Family history early CAD:  no  KNEE PAIN Patient states for the lat month her knee has  been bothering her.  States when she gets off work her left knee is swollen.  5/10 pain.  Patient states the swelling goes down after she hasn't been on it.  Doesn't recall any injury.  She does take aleve that helps it.  Being on it and walking makes it worse.      Relevant past medical, surgical, family and social history reviewed and updated as indicated. Interim medical history since our last visit reviewed. Allergies and medications reviewed and updated.  Review of Systems  Constitutional:  Positive for fatigue.  Musculoskeletal:        Left knee pain  Psychiatric/Behavioral:  Positive for dysphoric mood. Negative for suicidal ideas. The patient is nervous/anxious.     Per HPI unless specifically indicated above     Objective:    BP 138/82   Pulse 60   Temp (!) 97.2 F (36.2 C) (Oral)   Ht 5\' 3"  (1.6 m)   Wt 171 lb (77.6 kg)   LMP 08/21/2023   SpO2  97%   BMI 30.29 kg/m   Wt Readings from Last 3 Encounters:  09/17/23 171 lb (77.6 kg)  03/21/23 178 lb 6.4 oz (80.9 kg)  09/19/22 182 lb 9.6 oz (82.8 kg)    Physical Exam Vitals and nursing note reviewed.  Constitutional:      General: She is not in acute distress.    Appearance: Normal appearance. She is normal weight. She is not ill-appearing, toxic-appearing or diaphoretic.  HENT:     Head: Normocephalic.     Right Ear: External ear normal.     Left Ear: External ear normal.     Nose: Nose normal.     Mouth/Throat:     Mouth: Mucous membranes are moist.     Pharynx: Oropharynx is clear.  Eyes:     General:        Right eye: No discharge.        Left eye: No discharge.     Extraocular Movements: Extraocular movements intact.     Conjunctiva/sclera: Conjunctivae normal.     Pupils: Pupils are equal, round, and reactive to light.  Cardiovascular:     Rate and Rhythm: Normal rate and regular rhythm.     Heart sounds: No murmur heard. Pulmonary:     Effort: Pulmonary effort is normal. No respiratory distress.      Breath sounds: Normal breath sounds. No wheezing or rales.  Musculoskeletal:     Cervical back: Normal range of motion and neck supple.     Left knee: Swelling present. Tenderness present.     Comments: Limping due to left knee pain  Skin:    General: Skin is warm and dry.     Capillary Refill: Capillary refill takes less than 2 seconds.  Neurological:     General: No focal deficit present.     Mental Status: She is alert and oriented to person, place, and time. Mental status is at baseline.  Psychiatric:        Mood and Affect: Mood normal.        Behavior: Behavior normal.        Thought Content: Thought content normal.        Judgment: Judgment normal.     Results for orders placed or performed in visit on 03/21/23  Comp Met (CMET)   Collection Time: 03/21/23  1:19 PM  Result Value Ref Range   Glucose 97 70 - 99 mg/dL   BUN 18 6 - 24 mg/dL   Creatinine, Ser 4.09 0.57 - 1.00 mg/dL   eGFR 82 >81 XB/JYN/8.29   BUN/Creatinine Ratio 21 9 - 23   Sodium 140 134 - 144 mmol/L   Potassium 4.8 3.5 - 5.2 mmol/L   Chloride 102 96 - 106 mmol/L   CO2 23 20 - 29 mmol/L   Calcium 9.6 8.7 - 10.2 mg/dL   Total Protein 6.8 6.0 - 8.5 g/dL   Albumin 4.2 3.9 - 4.9 g/dL   Globulin, Total 2.6 1.5 - 4.5 g/dL   Bilirubin Total <5.6 0.0 - 1.2 mg/dL   Alkaline Phosphatase 80 44 - 121 IU/L   AST 13 0 - 40 IU/L   ALT 18 0 - 32 IU/L  Lipid Profile   Collection Time: 03/21/23  1:19 PM  Result Value Ref Range   Cholesterol, Total 209 (H) 100 - 199 mg/dL   Triglycerides 213 0 - 149 mg/dL   HDL 46 >08 mg/dL   VLDL Cholesterol Cal 25 5 - 40 mg/dL  LDL Chol Calc (NIH) 138 (H) 0 - 99 mg/dL   Chol/HDL Ratio 4.5 (H) 0.0 - 4.4 ratio  Thyroid Panel With TSH   Collection Time: 03/21/23  1:19 PM  Result Value Ref Range   TSH 1.260 0.450 - 4.500 uIU/mL   T4, Total 6.2 4.5 - 12.0 ug/dL   T3 Uptake Ratio 26 24 - 39 %   Free Thyroxine Index 1.6 1.2 - 4.9  Thyroid peroxidase antibody   Collection Time:  03/21/23  1:19 PM  Result Value Ref Range   Thyroperoxidase Ab SerPl-aCnc 43 (H) 0 - 34 IU/mL  Vitamin D (25 hydroxy)   Collection Time: 03/21/23  1:19 PM  Result Value Ref Range   Vit D, 25-Hydroxy 24.6 (L) 30.0 - 100.0 ng/mL      Assessment & Plan:   Problem List Items Addressed This Visit       Other   MDD (major depressive disorder), recurrent, severe, with psychosis (HCC) - Primary   Chronic.  Controlled.  Continue with current medication regimen of Zyprexa and Lexapro.  PHQ9 is elevated but patient feels like she is doing well.  Refills sent today.  Labs ordered today.  Return to clinic in 6 months for reevaluation.  Call sooner if concerns arise.       Relevant Medications   escitalopram (LEXAPRO) 20 MG tablet   Other Relevant Orders   Comprehensive metabolic panel with GFR   Abnormal thyroid blood test   Labs ordered at visit today.  Will make recommendations based on lab results.        Relevant Orders   T4   TSH   Hypercholesteremia   Relevant Medications   rosuvastatin (CRESTOR) 5 MG tablet   Other Relevant Orders   Lipid panel   Other Visit Diagnoses       Acute pain of left knee       Ongoing x 1 month.  Swelling on exam today. Will treate with medrol dose pak and obtain an xray for evaluation.   Relevant Orders   DG Knee Complete 4 Views Left         Follow up plan: Return in about 6 months (around 03/18/2024) for HTN, HLD, DM2 FU.

## 2023-09-17 NOTE — Assessment & Plan Note (Signed)
 Labs ordered at visit today.  Will make recommendations based on lab results.

## 2023-09-17 NOTE — Progress Notes (Signed)
 Attempted to call pt number listed is not a working number. Appt was made and reminder mailed to pt.

## 2023-09-18 ENCOUNTER — Encounter: Payer: Self-pay | Admitting: Nurse Practitioner

## 2023-09-18 LAB — TSH: TSH: 1.45 u[IU]/mL (ref 0.450–4.500)

## 2023-09-18 LAB — COMPREHENSIVE METABOLIC PANEL WITH GFR
ALT: 14 IU/L (ref 0–32)
AST: 14 IU/L (ref 0–40)
Albumin: 4.2 g/dL (ref 3.9–4.9)
Alkaline Phosphatase: 100 IU/L (ref 44–121)
BUN/Creatinine Ratio: 15 (ref 9–23)
BUN: 12 mg/dL (ref 6–24)
Bilirubin Total: <0.2 mg/dL (ref 0.0–1.2)
CO2: 22 mmol/L (ref 20–29)
Calcium: 9.6 mg/dL (ref 8.7–10.2)
Chloride: 103 mmol/L (ref 96–106)
Creatinine, Ser: 0.82 mg/dL (ref 0.57–1.00)
Globulin, Total: 2.7 g/dL (ref 1.5–4.5)
Glucose: 110 mg/dL — ABNORMAL HIGH (ref 70–99)
Potassium: 4.5 mmol/L (ref 3.5–5.2)
Sodium: 139 mmol/L (ref 134–144)
Total Protein: 6.9 g/dL (ref 6.0–8.5)
eGFR: 88 mL/min/{1.73_m2} (ref >59)

## 2023-09-18 LAB — T4: T4, Total: 6.4 ug/dL (ref 4.5–12.0)

## 2023-09-18 LAB — LIPID PANEL
Chol/HDL Ratio: 4.6 ratio — ABNORMAL HIGH (ref 0.0–4.4)
Cholesterol, Total: 193 mg/dL (ref 100–199)
HDL: 42 mg/dL (ref >39)
LDL Chol Calc (NIH): 133 mg/dL — ABNORMAL HIGH (ref 0–99)
Triglycerides: 101 mg/dL (ref 0–149)
VLDL Cholesterol Cal: 18 mg/dL (ref 5–40)

## 2024-01-07 ENCOUNTER — Other Ambulatory Visit: Payer: Self-pay | Admitting: Nurse Practitioner

## 2024-01-07 NOTE — Telephone Encounter (Unsigned)
 Copied from CRM [id number]. Topic: Clinical - Medication Refill >> Jan 07, 2024  8:50 AM Precious C wrote: Medication:  escitalopram  (LEXAPRO ) 20 MG tablet OLANZapine  zydis (ZYPREXA ) 20 MG disintegrating tablet rosuvastatin  (CRESTOR ) 5 MG tablet    Has the patient contacted their pharmacy?Yes, pt has 1 Rx refills (Agent: If no, request that the patient contact the pharmacy for the refill. If patient does not wish to contact the pharmacy document the reason why and proceed with request.) (Agent: If yes, when and what did the pharmacy advise?)  This is the patient's preferred pharmacy:  [facility] [address], [place] - [address] [place] [address] Phone: [phone] Fax: [phone]  Is this the correct pharmacy for this prescription? Yes If no, delete pharmacy and type the correct one.   Has the prescription been filled recently? No  Is the patient out of the medication? No  Has the patient been seen for an appointment in the last year OR does the patient have an upcoming appointment? Yes  Can we respond through MyChart?  No  Agent: Please be advised that Rx refills may take up to 3 business days. We ask that you follow-up with your pharmacy.

## 2024-01-08 MED ORDER — OLANZAPINE 20 MG PO TBDP
20.0000 mg | ORAL_TABLET | Freq: Every day | ORAL | 0 refills | Status: DC
Start: 2024-01-08 — End: 2024-03-18

## 2024-01-08 NOTE — Telephone Encounter (Signed)
 Requested medication (s) are due for refill today: no  Requested medication (s) are on the active medication list: yes  Last refill:  09/17/23 #90 1 RF  Future visit scheduled: Notes to clinic:  med not delegated to NT to RF   Requested Prescriptions  Pending Prescriptions Disp Refills   OLANZapine  zydis (ZYPREXA ) 20 MG disintegrating tablet 90 tablet     Sig: Take 1 tablet (20 mg total) by mouth at bedtime.     Not Delegated - Psychiatry:  Antipsychotics - Second Generation (Atypical) - olanzapine  Failed - 01/08/2024  1:36 PM      Failed - This refill cannot be delegated      Failed - Lipid Panel in normal range within the last 12 months    Cholesterol, Total  Date Value Ref Range Status  09/17/2023 193 100 - 199 mg/dL Final   LDL Chol Calc (NIH)  Date Value Ref Range Status  09/17/2023 133 (H) 0 - 99 mg/dL Final   HDL  Date Value Ref Range Status  09/17/2023 42 >39 mg/dL Final   Triglycerides  Date Value Ref Range Status  09/17/2023 101 0 - 149 mg/dL Final         Failed - CBC within normal limits and completed in the last 12 months    WBC  Date Value Ref Range Status  09/19/2022 8.4 3.4 - 10.8 x10E3/uL Final  10/18/2020 10.1 4.0 - 10.5 K/uL Final   RBC  Date Value Ref Range Status  09/19/2022 4.49 3.77 - 5.28 x10E6/uL Final  10/18/2020 4.20 3.87 - 5.11 MIL/uL Final   Hemoglobin  Date Value Ref Range Status  09/19/2022 13.4 11.1 - 15.9 g/dL Final   Hematocrit  Date Value Ref Range Status  09/19/2022 40.8 34.0 - 46.6 % Final   MCHC  Date Value Ref Range Status  09/19/2022 32.8 31.5 - 35.7 g/dL Final  94/90/7977 66.7 30.0 - 36.0 g/dL Final   [facility]  Date Value Ref Range Status  09/19/2022 29.8 26.6 - 33.0 pg Final  10/18/2020 30.5 26.0 - 34.0 pg Final   MCV  Date Value Ref Range Status  09/19/2022 91 79 - 97 fL Final   No results found for: PLTCOUNTKUC, LABPLAT, POCPLA RDW  Date Value Ref Range Status  09/19/2022 15.0 11.7 - 15.4 % Final          Passed - TSH in normal range and within 360 days    TSH  Date Value Ref Range Status  09/17/2023 1.450 0.450 - 4.500 uIU/mL Final         Passed - Completed PHQ-2 or PHQ-9 in the last 360 days      Passed - Last BP in normal range    BP Readings from Last 1 Encounters:  09/17/23 138/82         Passed - Last Heart Rate in normal range    Pulse Readings from Last 1 Encounters:  09/17/23 60         Passed - Valid encounter within last 6 months    Recent Outpatient Visits           3 months ago MDD (major depressive disorder), recurrent, severe, with psychosis (HCC)   [place] [facility] Melvin Pao, NP              Passed - CMP within normal limits and completed in the last 12 months    Albumin  Date Value Ref Range Status  09/17/2023 4.2 3.9 -  4.9 g/dL Final   Alkaline Phosphatase  Date Value Ref Range Status  09/17/2023 100 44 - 121 IU/L Final   ALT  Date Value Ref Range Status  09/17/2023 14 0 - 32 IU/L Final   AST  Date Value Ref Range Status  09/17/2023 14 0 - 40 IU/L Final   BUN  Date Value Ref Range Status  09/17/2023 12 6 - 24 mg/dL Final   Calcium   Date Value Ref Range Status  09/17/2023 9.6 8.7 - 10.2 mg/dL Final   CO2  Date Value Ref Range Status  09/17/2023 22 20 - 29 mmol/L Final   Creatinine, Ser  Date Value Ref Range Status  09/17/2023 0.82 0.57 - 1.00 mg/dL Final   Glucose  Date Value Ref Range Status  09/17/2023 110 (H) 70 - 99 mg/dL Final   Glucose, Bld  Date Value Ref Range Status  10/18/2020 97 70 - 99 mg/dL Final    Comment:    Glucose reference range applies only to samples taken after fasting for at least 8 hours.   Glucose-Capillary  Date Value Ref Range Status  11/15/2020 84 70 - 99 mg/dL Final    Comment:    Glucose reference range applies only to samples taken after fasting for at least 8 hours.   Potassium  Date Value Ref Range Status  09/17/2023 4.5 3.5 - 5.2 mmol/L Final    Sodium  Date Value Ref Range Status  09/17/2023 139 134 - 144 mmol/L Final   Bilirubin Total  Date Value Ref Range Status  09/17/2023 <0.2 0.0 - 1.2 mg/dL Final   Protein,UA  Date Value Ref Range Status  09/19/2022 Negative Negative/Trace Final   Total Protein  Date Value Ref Range Status  09/17/2023 6.9 6.0 - 8.5 g/dL Final   eGFR  Date Value Ref Range Status  09/17/2023 88 >59 mL/min/1.73 Final   GFR, Estimated  Date Value Ref Range Status  10/18/2020 >60 >60 mL/min Final    Comment:    (NOTE) Calculated using the CKD-EPI Creatinine Equation (2021)          Refused Prescriptions Disp Refills   escitalopram  (LEXAPRO ) 20 MG tablet 90 tablet     Sig: Take 1 tablet (20 mg total) by mouth at bedtime. OFFICE VISIT NEEDED FOR ADDITIONAL REFILLS     Psychiatry:  Antidepressants - SSRI Passed - 01/08/2024  1:36 PM      Passed - Completed PHQ-2 or PHQ-9 in the last 360 days      Passed - Valid encounter within last 6 months    Recent Outpatient Visits           3 months ago MDD (major depressive disorder), recurrent, severe, with psychosis (HCC)   [place] [facility] Melvin Pao, NP               rosuvastatin  (CRESTOR ) 5 MG tablet 90 tablet     Sig: Take 1 tablet (5 mg total) by mouth daily.     Cardiovascular:  Antilipid - Statins 2 Failed - 01/08/2024  1:36 PM      Failed - Lipid Panel in normal range within the last 12 months    Cholesterol, Total  Date Value Ref Range Status  09/17/2023 193 100 - 199 mg/dL Final   LDL Chol Calc (NIH)  Date Value Ref Range Status  09/17/2023 133 (H) 0 - 99 mg/dL Final   HDL  Date Value Ref Range Status  09/17/2023 42 >39 mg/dL Final  Triglycerides  Date Value Ref Range Status  09/17/2023 101 0 - 149 mg/dL Final         Passed - Cr in normal range and within 360 days    Creatinine, Ser  Date Value Ref Range Status  09/17/2023 0.82 0.57 - 1.00 mg/dL Final         Passed - Patient is  not pregnant      Passed - Valid encounter within last 12 months    Recent Outpatient Visits           3 months ago MDD (major depressive disorder), recurrent, severe, with psychosis (HCC)   [place] [facility] Melvin Pao, NP

## 2024-01-08 NOTE — Telephone Encounter (Signed)
 Requested Prescriptions  Pending Prescriptions Disp Refills   escitalopram  (LEXAPRO ) 20 MG tablet 90 tablet     Sig: Take 1 tablet (20 mg total) by mouth at bedtime. OFFICE VISIT NEEDED FOR ADDITIONAL REFILLS     Psychiatry:  Antidepressants - SSRI Passed - 01/08/2024  1:34 PM      Passed - Completed PHQ-2 or PHQ-9 in the last 360 days      Passed - Valid encounter within last 6 months    Recent Outpatient Visits           3 months ago MDD (major depressive disorder), recurrent, severe, with psychosis (HCC)   [place] [facility] Melvin Pao, NP               OLANZapine  zydis (ZYPREXA ) 20 MG disintegrating tablet 90 tablet     Sig: Take 1 tablet (20 mg total) by mouth at bedtime.     Not Delegated - Psychiatry:  Antipsychotics - Second Generation (Atypical) - olanzapine  Failed - 01/08/2024  1:34 PM      Failed - This refill cannot be delegated      Failed - Lipid Panel in normal range within the last 12 months    Cholesterol, Total  Date Value Ref Range Status  09/17/2023 193 100 - 199 mg/dL Final   LDL Chol Calc (NIH)  Date Value Ref Range Status  09/17/2023 133 (H) 0 - 99 mg/dL Final   HDL  Date Value Ref Range Status  09/17/2023 42 >39 mg/dL Final   Triglycerides  Date Value Ref Range Status  09/17/2023 101 0 - 149 mg/dL Final         Failed - CBC within normal limits and completed in the last 12 months    WBC  Date Value Ref Range Status  09/19/2022 8.4 3.4 - 10.8 x10E3/uL Final  10/18/2020 10.1 4.0 - 10.5 K/uL Final   RBC  Date Value Ref Range Status  09/19/2022 4.49 3.77 - 5.28 x10E6/uL Final  10/18/2020 4.20 3.87 - 5.11 MIL/uL Final   Hemoglobin  Date Value Ref Range Status  09/19/2022 13.4 11.1 - 15.9 g/dL Final   Hematocrit  Date Value Ref Range Status  09/19/2022 40.8 34.0 - 46.6 % Final   MCHC  Date Value Ref Range Status  09/19/2022 32.8 31.5 - 35.7 g/dL Final  94/90/7977 66.7 30.0 - 36.0 g/dL Final   [facility]  Date  Value Ref Range Status  09/19/2022 29.8 26.6 - 33.0 pg Final  10/18/2020 30.5 26.0 - 34.0 pg Final   MCV  Date Value Ref Range Status  09/19/2022 91 79 - 97 fL Final   No results found for: PLTCOUNTKUC, LABPLAT, POCPLA RDW  Date Value Ref Range Status  09/19/2022 15.0 11.7 - 15.4 % Final         Passed - TSH in normal range and within 360 days    TSH  Date Value Ref Range Status  09/17/2023 1.450 0.450 - 4.500 uIU/mL Final         Passed - Completed PHQ-2 or PHQ-9 in the last 360 days      Passed - Last BP in normal range    BP Readings from Last 1 Encounters:  09/17/23 138/82         Passed - Last Heart Rate in normal range    Pulse Readings from Last 1 Encounters:  09/17/23 60         Passed - Valid encounter within last 6  months    Recent Outpatient Visits           3 months ago MDD (major depressive disorder), recurrent, severe, with psychosis (HCC)   [place] [facility] Melvin Pao, NP              Passed - CMP within normal limits and completed in the last 12 months    Albumin  Date Value Ref Range Status  09/17/2023 4.2 3.9 - 4.9 g/dL Final   Alkaline Phosphatase  Date Value Ref Range Status  09/17/2023 100 44 - 121 IU/L Final   ALT  Date Value Ref Range Status  09/17/2023 14 0 - 32 IU/L Final   AST  Date Value Ref Range Status  09/17/2023 14 0 - 40 IU/L Final   BUN  Date Value Ref Range Status  09/17/2023 12 6 - 24 mg/dL Final   Calcium   Date Value Ref Range Status  09/17/2023 9.6 8.7 - 10.2 mg/dL Final   CO2  Date Value Ref Range Status  09/17/2023 22 20 - 29 mmol/L Final   Creatinine, Ser  Date Value Ref Range Status  09/17/2023 0.82 0.57 - 1.00 mg/dL Final   Glucose  Date Value Ref Range Status  09/17/2023 110 (H) 70 - 99 mg/dL Final   Glucose, Bld  Date Value Ref Range Status  10/18/2020 97 70 - 99 mg/dL Final    Comment:    Glucose reference range applies only to samples taken after fasting  for at least 8 hours.   Glucose-Capillary  Date Value Ref Range Status  11/15/2020 84 70 - 99 mg/dL Final    Comment:    Glucose reference range applies only to samples taken after fasting for at least 8 hours.   Potassium  Date Value Ref Range Status  09/17/2023 4.5 3.5 - 5.2 mmol/L Final   Sodium  Date Value Ref Range Status  09/17/2023 139 134 - 144 mmol/L Final   Bilirubin Total  Date Value Ref Range Status  09/17/2023 <0.2 0.0 - 1.2 mg/dL Final   Protein,UA  Date Value Ref Range Status  09/19/2022 Negative Negative/Trace Final   Total Protein  Date Value Ref Range Status  09/17/2023 6.9 6.0 - 8.5 g/dL Final   eGFR  Date Value Ref Range Status  09/17/2023 88 >59 mL/min/1.73 Final   GFR, Estimated  Date Value Ref Range Status  10/18/2020 >60 >60 mL/min Final    Comment:    (NOTE) Calculated using the CKD-EPI Creatinine Equation (2021)          Refused Prescriptions Disp Refills   rosuvastatin  (CRESTOR ) 5 MG tablet 90 tablet     Sig: Take 1 tablet (5 mg total) by mouth daily.     Cardiovascular:  Antilipid - Statins 2 Failed - 01/08/2024  1:34 PM      Failed - Lipid Panel in normal range within the last 12 months    Cholesterol, Total  Date Value Ref Range Status  09/17/2023 193 100 - 199 mg/dL Final   LDL Chol Calc (NIH)  Date Value Ref Range Status  09/17/2023 133 (H) 0 - 99 mg/dL Final   HDL  Date Value Ref Range Status  09/17/2023 42 >39 mg/dL Final   Triglycerides  Date Value Ref Range Status  09/17/2023 101 0 - 149 mg/dL Final         Passed - Cr in normal range and within 360 days    Creatinine, Ser  Date Value Ref  Range Status  09/17/2023 0.82 0.57 - 1.00 mg/dL Final         Passed - Patient is not pregnant      Passed - Valid encounter within last 12 months    Recent Outpatient Visits           3 months ago MDD (major depressive disorder), recurrent, severe, with psychosis (HCC)   [place] [facility]  Melvin Pao, NP

## 2024-01-30 NOTE — Telephone Encounter (Signed)
 Copied from CRM #[id number]. Topic: Clinical - Medication Prior Auth >> Jan 30, 2024 10:20 AM [name] wrote: Reason for CRM: Patient husband Cristina Finley) is calling b/c Medicaid needs a prior authorization for  OLANZapine  zydis (ZYPREXA ) 20 MG disintegrating tablet.  The patient has been out of the medicine for 3 days. Reed can be contacted at [phone]

## 2024-01-31 ENCOUNTER — Other Ambulatory Visit (HOSPITAL_COMMUNITY): Payer: Self-pay

## 2024-01-31 ENCOUNTER — Telehealth: Payer: Self-pay

## 2024-01-31 NOTE — Telephone Encounter (Signed)
 Pharmacy Patient Advocate Encounter   Received notification from Onbase that prior authorization for OLANZapine  20MG  dispersible tablets is required/requested.   Insurance verification completed.   The patient is insured through [facility] .   Per test claim: PA required; PA submitted to above mentioned insurance via Latent Key/confirmation #/EOC [id number] Status is pending

## 2024-02-01 NOTE — Telephone Encounter (Signed)
 Copied from CRM [id number]. Topic: Clinical - Prescription Issue >> Jan 31, 2024 10:31 AM Cristina Finley wrote: Reason for CRM: Patient's husband is coming calling about prior authorization status for OLANZapine  zydis (ZYPREXA ) 20 MG. He is requesting samples if possible because the last time she was off of this medication, she was hospitalized for a month. She hasn't taken medication in 1 week.

## 2024-02-01 NOTE — Telephone Encounter (Deleted)
 Ok for E2C2 to review.  If call is returned please advise that the pharmacy team is working on this PA and that once approved/denied we can contact with that update.

## 2024-02-01 NOTE — Telephone Encounter (Signed)
 Ok for E2C2 to review.   PA is approved, please advise if call is returned.

## 2024-02-01 NOTE — Telephone Encounter (Signed)
 Pharmacy Patient Advocate Encounter  Received notification from [facility] that Prior Authorization for OLANZapine  20MG  dispersible tablets has been APPROVED from 01/31/2024 to 01/30/2025   PA #/Case ID/Reference #: [id number]

## 2024-03-18 ENCOUNTER — Encounter: Payer: Self-pay | Admitting: Nurse Practitioner

## 2024-03-18 ENCOUNTER — Ambulatory Visit: Admitting: Nurse Practitioner

## 2024-03-18 VITALS — BP 126/76 | HR 56 | Temp 98.7°F | Ht 63.0 in | Wt 155.0 lb

## 2024-03-18 DIAGNOSIS — Z1231 Encounter for screening mammogram for malignant neoplasm of breast: Secondary | ICD-10-CM

## 2024-03-18 DIAGNOSIS — Z0000 Encounter for general adult medical examination without abnormal findings: Secondary | ICD-10-CM | POA: Diagnosis not present

## 2024-03-18 DIAGNOSIS — E7800 Pure hypercholesterolemia, unspecified: Secondary | ICD-10-CM | POA: Diagnosis not present

## 2024-03-18 DIAGNOSIS — Z23 Encounter for immunization: Secondary | ICD-10-CM

## 2024-03-18 DIAGNOSIS — F333 Major depressive disorder, recurrent, severe with psychotic symptoms: Secondary | ICD-10-CM | POA: Diagnosis not present

## 2024-03-18 DIAGNOSIS — Z1211 Encounter for screening for malignant neoplasm of colon: Secondary | ICD-10-CM | POA: Diagnosis not present

## 2024-03-18 MED ORDER — CITALOPRAM HYDROBROMIDE 40 MG PO TABS: 40.0000 mg | ORAL_TABLET | Freq: Every day | ORAL | 1 refills | Status: AC

## 2024-03-18 MED ORDER — OLANZAPINE 20 MG PO TBDP: 20.0000 mg | ORAL_TABLET | Freq: Every day | ORAL | 1 refills | Status: AC

## 2024-03-18 NOTE — Assessment & Plan Note (Signed)
 Chronic.  Controlled.  Continue with current medication regimen.  Labs ordered today.  Return to clinic in 6 months for reevaluation.  Call sooner if concerns arise.  ? ?

## 2024-03-18 NOTE — Assessment & Plan Note (Signed)
 Chronic.  Not well controlled.  Continue with Zyprexa .  Will change Lexapro  to Citalopram  40mg  daily.  Side effects and benefits discussed. Refills sent today.  Labs ordered today.  Return to clinic in 2 months for reevaluation.  Call sooner if concerns arise.

## 2024-03-18 NOTE — Progress Notes (Signed)
 BP 126/76   Pulse (!) 56   Temp 98.7 F (37.1 C) (Oral)   Ht 5' 3 (1.6 m)   Wt 155 lb (70.3 kg)   LMP 03/04/2024   SpO2 97%   BMI 27.46 kg/m    Subjective:    Patient ID: Cristina Finley, female    DOB: [birth year], 48 y.o.   MRN: [id number]  HPI: Cristina Finley is a 48 y.o. female presenting on 03/18/2024 for comprehensive medical examination. Current medical complaints include:none  She currently lives with: Menopausal Symptoms: no  HYPERTENSION / HYPERLIPIDEMIA Satisfied with current treatment? no Duration of hypertension: years BP monitoring frequency: twice weekly BP range: 129/70 BP medication side effects: no Past BP meds: none Duration of hyperlipidemia: years Cholesterol medication side effects: no Cholesterol supplements: none Past cholesterol medications: none Medication compliance: excellent compliance Aspirin: no Recent stressors: no Recurrent headaches: no Visual changes: no Palpitations: no Dyspnea: no Chest pain: no Lower extremity edema: no Dizzy/lightheaded: no Patient states the rosuvastatin  made her muscles cramp so she stopped taking it.    ANXIETY/DEPRESSION Patient states her mood has been okay.  She is still taking the Zyprexa  and Lexapro .  She feels like increasing the dose of the lexapro  would be helpful.  She does struggle with anxiety.  Denies SI.  Denies concerns at visit today.    Depression Screen done today and results listed below:     03/18/2024   10:54 AM 09/17/2023   11:06 AM 03/21/2023    1:09 PM 09/19/2022    2:48 PM 01/16/2022   11:13 AM  Depression screen PHQ 2/9  Decreased Interest 2 2 2 2  0  Down, Depressed, Hopeless 2 2 2 1 1   PHQ - 2 Score 4 4 4 3 1   Altered sleeping 2 1 1 1  0  Tired, decreased energy 2 1 0 1 1  Change in appetite 0 1 2 1 1   Feeling bad or failure about yourself  2 1 1 1  0  Trouble concentrating 2 1 1 1 1   Moving slowly or fidgety/restless 2 2 0 0 0  Suicidal thoughts 0 2 0 0 0  PHQ-9 Score 14 13 9  8 4   Difficult doing work/chores    Somewhat difficult Not difficult at all    The patient does not have a history of falls. I did complete a risk assessment for falls. A plan of care for falls was documented.   Past Medical History:  Past Medical History:  Diagnosis Date   Depression     Surgical History:  Past Surgical History:  Procedure Laterality Date   CHOLECYSTECTOMY  2000    Medications:  Current Outpatient Medications on File Prior to Visit  Medication Sig   Vitamin D , Ergocalciferol , (DRISDOL ) 1.25 MG (50000 UNIT) CAPS capsule Take 1 capsule (50,000 Units total) by mouth every 7 (seven) days. (Patient not taking: Reported on 03/18/2024)   No current facility-administered medications on file prior to visit.    Allergies:  Allergies  Allergen Reactions   Amoxicillin Hives   Penicillins Hives and Nausea Only    Social History:  Social History   Socioeconomic History   Marital status: Married    Spouse name: Not on file   Number of children: Not on file   Years of education: Not on file   Highest education level: Not on file  Occupational History   Not on file  Tobacco Use   Smoking status: Every Day    Current packs/day:  1.00    Types: Cigarettes   Smokeless tobacco: Never  Vaping Use   Vaping status: Never Used  Substance and Sexual Activity   Alcohol use: Not Currently    Alcohol/week: 6.0 standard drinks of alcohol    Types: 6 Cans of beer per week    Comment: one 6 pack daily   Drug use: Not Currently    Types: Marijuana   Sexual activity: Yes    Partners: Male    Birth control/protection: Surgical  Other Topics Concern   Not on file  Social History Narrative   Not on file   Social Drivers of Health   Financial Resource Strain: Not on file  Food Insecurity: Not on file  Transportation Needs: Not on file  Physical Activity: Not on file  Stress: Not on file  Social Connections: Not on file  Intimate Partner Violence: Not on file    Social History   Tobacco Use  Smoking Status Every Day   Current packs/day: 1.00   Types: Cigarettes  Smokeless Tobacco Never   Social History   Substance and Sexual Activity  Alcohol Use Not Currently   Alcohol/week: 6.0 standard drinks of alcohol   Types: 6 Cans of beer per week   Comment: one 6 pack daily    Family History:  Family History  Problem Relation Age of Onset   Depression Mother    COPD Father    Depression Paternal Aunt    Heart disease Maternal Grandmother    Cancer Maternal Grandfather    Cancer Paternal Grandmother     Past medical history, surgical history, medications, allergies, family history and social history reviewed with patient today and changes made to appropriate areas of the chart.   Review of Systems  Eyes:  Negative for blurred vision and double vision.  Respiratory:  Negative for shortness of breath.   Cardiovascular:  Negative for chest pain, palpitations and leg swelling.  Neurological:  Negative for dizziness and headaches.  Psychiatric/Behavioral:  Positive for depression. Negative for suicidal ideas. The patient is nervous/anxious.    All other ROS negative except what is listed above and in the HPI.      Objective:    BP 126/76   Pulse (!) 56   Temp 98.7 F (37.1 C) (Oral)   Ht 5' 3 (1.6 m)   Wt 155 lb (70.3 kg)   LMP 03/04/2024   SpO2 97%   BMI 27.46 kg/m   Wt Readings from Last 3 Encounters:  03/18/24 155 lb (70.3 kg)  09/17/23 171 lb (77.6 kg)  03/21/23 178 lb 6.4 oz (80.9 kg)    Physical Exam Vitals and nursing note reviewed.  Constitutional:      General: She is awake. She is not in acute distress.    Appearance: Normal appearance. She is well-developed and normal weight. She is not ill-appearing.  HENT:     Head: Normocephalic and atraumatic.     Right Ear: Hearing, tympanic membrane, ear canal and external ear normal. No drainage.     Left Ear: Hearing, tympanic membrane, ear canal and external ear  normal. No drainage.     Nose: Nose normal.     Right Sinus: No maxillary sinus tenderness or frontal sinus tenderness.     Left Sinus: No maxillary sinus tenderness or frontal sinus tenderness.     Mouth/Throat:     Mouth: Mucous membranes are moist.     Pharynx: Oropharynx is clear. Uvula midline. No pharyngeal swelling, oropharyngeal exudate  or posterior oropharyngeal erythema.  Eyes:     General: Lids are normal.        Right eye: No discharge.        Left eye: No discharge.     Extraocular Movements: Extraocular movements intact.     Conjunctiva/sclera: Conjunctivae normal.     Pupils: Pupils are equal, round, and reactive to light.     Visual Fields: Right eye visual fields normal and left eye visual fields normal.  Neck:     Thyroid : No thyromegaly.     Vascular: No carotid bruit.     Trachea: Trachea normal.  Cardiovascular:     Rate and Rhythm: Normal rate and regular rhythm.     Heart sounds: Normal heart sounds. No murmur heard.    No gallop.  Pulmonary:     Effort: Pulmonary effort is normal. No accessory muscle usage or respiratory distress.     Breath sounds: Normal breath sounds.  Chest:  Breasts:    Right: Normal.     Left: Normal.  Abdominal:     General: Bowel sounds are normal.     Palpations: Abdomen is soft. There is no hepatomegaly or splenomegaly.     Tenderness: There is no abdominal tenderness.  Musculoskeletal:        General: Normal range of motion.     Cervical back: Normal range of motion and neck supple.     Right lower leg: No edema.     Left lower leg: No edema.  Lymphadenopathy:     Head:     Right side of head: No submental, submandibular, tonsillar, preauricular or posterior auricular adenopathy.     Left side of head: No submental, submandibular, tonsillar, preauricular or posterior auricular adenopathy.     Cervical: No cervical adenopathy.     Upper Body:     Right upper body: No supraclavicular, axillary or pectoral adenopathy.      Left upper body: No supraclavicular, axillary or pectoral adenopathy.  Skin:    General: Skin is warm and dry.     Capillary Refill: Capillary refill takes less than 2 seconds.     Findings: No rash.  Neurological:     Mental Status: She is alert and oriented to person, place, and time.     Gait: Gait is intact.     Deep Tendon Reflexes: Reflexes are normal and symmetric.     Reflex Scores:      Brachioradialis reflexes are 2+ on the right side and 2+ on the left side.      Patellar reflexes are 2+ on the right side and 2+ on the left side. Psychiatric:        Attention and Perception: Attention normal.        Mood and Affect: Mood normal.        Speech: Speech normal.        Behavior: Behavior normal. Behavior is cooperative.        Thought Content: Thought content normal.        Judgment: Judgment normal.     Results for orders placed or performed in visit on 09/17/23  Comprehensive metabolic panel with GFR   Collection Time: 09/17/23 11:19 AM  Result Value Ref Range   Glucose 110 (H) 70 - 99 mg/dL   BUN 12 6 - 24 mg/dL   Creatinine, Ser 9.17 0.57 - 1.00 mg/dL   eGFR 88 >40 fO/fpw/8.26   BUN/Creatinine Ratio 15 9 - 23   Sodium 139  134 - 144 mmol/L   Potassium 4.5 3.5 - 5.2 mmol/L   Chloride 103 96 - 106 mmol/L   CO2 22 20 - 29 mmol/L   Calcium  9.6 8.7 - 10.2 mg/dL   Total Protein 6.9 6.0 - 8.5 g/dL   Albumin 4.2 3.9 - 4.9 g/dL   Globulin, Total 2.7 1.5 - 4.5 g/dL   Bilirubin Total <9.7 0.0 - 1.2 mg/dL   Alkaline Phosphatase 100 44 - 121 IU/L   AST 14 0 - 40 IU/L   ALT 14 0 - 32 IU/L  Lipid panel   Collection Time: 09/17/23 11:19 AM  Result Value Ref Range   Cholesterol, Total 193 100 - 199 mg/dL   Triglycerides 898 0 - 149 mg/dL   HDL 42 >60 mg/dL   VLDL Cholesterol Cal 18 5 - 40 mg/dL   LDL Chol Calc (NIH) 866 (H) 0 - 99 mg/dL   Chol/HDL Ratio 4.6 (H) 0.0 - 4.4 ratio  T4   Collection Time: 09/17/23 11:19 AM  Result Value Ref Range   T4, Total 6.4 4.5 - 12.0  ug/dL  TSH   Collection Time: 09/17/23 11:19 AM  Result Value Ref Range   TSH 1.450 0.450 - 4.500 uIU/mL      Assessment & Plan:   Problem List Items Addressed This Visit       Other   MDD (major depressive disorder), recurrent, severe, with psychosis (HCC)   Chronic.  Not well controlled.  Continue with Zyprexa .  Will change Lexapro  to Citalopram  40mg  daily.  Side effects and benefits discussed. Refills sent today.  Labs ordered today.  Return to clinic in 2 months for reevaluation.  Call sooner if concerns arise.       Relevant Medications   citalopram  (CELEXA ) 40 MG tablet   Hypercholesteremia   Chronic.  Controlled.  Continue with current medication regimen.  Labs ordered today.  Return to clinic in 6 months for reevaluation.  Call sooner if concerns arise.        Relevant Orders   Lipid panel   Other Visit Diagnoses       Annual physical exam    -  Primary   Health maintenance reviewed during visit today.  Labs ordered.  Vaccines reviewed. Will get PAP at next visit.  Mammogram and Cologuard ordered.   Relevant Orders   CBC with Differential/Platelet   Comprehensive metabolic panel with GFR   Lipid panel   TSH     Need for vaccination against Streptococcus pneumoniae       Relevant Orders   Pneumococcal conjugate vaccine 20-valent (Prevnar 20) (Completed)     Encounter for screening mammogram for malignant neoplasm of breast       Relevant Orders   MM 3D SCREENING MAMMOGRAM BILATERAL BREAST     Screening for colon cancer       Relevant Orders   Cologuard         Follow up plan: Return in about 2 months (around 05/18/2024) for Depression/Anxiety FU.   LABORATORY TESTING:  - Pap smear: Up to date  IMMUNIZATIONS:   - Tdap: Tetanus vaccination status reviewed: last tetanus booster within 10 years. - Influenza: Postponed to flu season - Pneumovax: up to date - Prevnar: Not applicable - HPV: Not applicable - Zostavax vaccine: Not  applicable  SCREENING: -Mammogram: Ordered today  - Colonoscopy: Ordered today - Bone Density: Not applicable  -Hearing Test: Not applicable  -Spirometry: Not applicable   PATIENT COUNSELING:  Advised to take 1 mg of folate supplement per day if capable of pregnancy.   Sexuality: Discussed sexually transmitted diseases, partner selection, use of condoms, avoidance of unintended pregnancy  and contraceptive alternatives.   Advised to avoid cigarette smoking.  I discussed with the patient that most people either abstain from alcohol or drink within safe limits (<=14/week and <=4 drinks/occasion for males, <=7/weeks and <= 3 drinks/occasion for females) and that the risk for alcohol disorders and other health effects rises proportionally with the number of drinks per week and how often a drinker exceeds daily limits.  Discussed cessation/primary prevention of drug use and availability of treatment for abuse.   Diet: Encouraged to adjust caloric intake to maintain  or achieve ideal body weight, to reduce intake of dietary saturated fat and total fat, to limit sodium intake by avoiding high sodium foods and not adding table salt, and to maintain adequate dietary potassium and calcium  preferably from fresh fruits, vegetables, and low-fat dairy products.    stressed the importance of regular exercise  Injury prevention: Discussed safety belts, safety helmets, smoke detector, smoking near bedding or upholstery.   Dental health: Discussed importance of regular tooth brushing, flossing, and dental visits.    NEXT PREVENTATIVE PHYSICAL DUE IN 1 YEAR. Return in about 2 months (around 05/18/2024) for Depression/Anxiety FU.

## 2024-03-19 LAB — COMPREHENSIVE METABOLIC PANEL WITH GFR
ALT: 10 IU/L (ref 0–32)
AST: 13 IU/L (ref 0–40)
Albumin: 4.5 g/dL (ref 3.9–4.9)
Alkaline Phosphatase: 80 IU/L (ref 41–116)
BUN/Creatinine Ratio: 19 (ref 9–23)
BUN: 15 mg/dL (ref 6–24)
Bilirubin Total: <0.2 mg/dL (ref 0.0–1.2)
CO2: 23 mmol/L (ref 20–29)
Calcium: 9.7 mg/dL (ref 8.7–10.2)
Chloride: 102 mmol/L (ref 96–106)
Creatinine, Ser: 0.79 mg/dL (ref 0.57–1.00)
Globulin, Total: 2.5 g/dL (ref 1.5–4.5)
Glucose: 93 mg/dL (ref 70–99)
Potassium: 4.6 mmol/L (ref 3.5–5.2)
Sodium: 139 mmol/L (ref 134–144)
Total Protein: 7 g/dL (ref 6.0–8.5)
eGFR: 91 mL/min/1.73 (ref >59)

## 2024-03-19 LAB — CBC WITH DIFFERENTIAL/PLATELET
Basophils Absolute: 0 x10E3/uL (ref 0.0–0.2)
Basos: 0 %
EOS (ABSOLUTE): 0.1 x10E3/uL (ref 0.0–0.4)
Eos: 1 %
Hematocrit: 44.7 % (ref 34.0–46.6)
Hemoglobin: 14.4 g/dL (ref 11.1–15.9)
Immature Grans (Abs): 0 x10E3/uL (ref 0.0–0.1)
Immature Granulocytes: 0 %
Lymphocytes Absolute: 2.6 x10E3/uL (ref 0.7–3.1)
Lymphs: 32 %
MCH: 30.1 pg (ref 26.6–33.0)
MCHC: 32.2 g/dL (ref 31.5–35.7)
MCV: 94 fL (ref 79–97)
Monocytes Absolute: 0.5 x10E3/uL (ref 0.1–0.9)
Monocytes: 7 %
Neutrophils Absolute: 4.8 x10E3/uL (ref 1.4–7.0)
Neutrophils: 60 %
Platelets: 264 x10E3/uL (ref 150–450)
RBC: 4.78 x10E6/uL (ref 3.77–5.28)
RDW: 13.3 % (ref 11.7–15.4)
WBC: 8.1 x10E3/uL (ref 3.4–10.8)

## 2024-03-19 LAB — LIPID PANEL
Chol/HDL Ratio: 5.1 ratio — ABNORMAL HIGH (ref 0.0–4.4)
Cholesterol, Total: 215 mg/dL — ABNORMAL HIGH (ref 100–199)
HDL: 42 mg/dL (ref >39)
LDL Chol Calc (NIH): 149 mg/dL — ABNORMAL HIGH (ref 0–99)
Triglycerides: 132 mg/dL (ref 0–149)
VLDL Cholesterol Cal: 24 mg/dL (ref 5–40)

## 2024-03-19 LAB — TSH: TSH: 2.81 u[IU]/mL (ref 0.450–4.500)

## 2024-03-20 ENCOUNTER — Ambulatory Visit: Payer: Self-pay | Admitting: Nurse Practitioner

## 2024-03-26 ENCOUNTER — Telehealth: Payer: Self-pay

## 2024-03-26 NOTE — Telephone Encounter (Signed)
 Copied from CRM #[id number]. Topic: Clinical - Prescription Issue >> Mar 26, 2024  2:05 PM Winona R wrote: Please resend pt rx citalopram  (CELEXA ) 40 MG table,  OLANZapine  zydis (ZYPREXA ) 20 MG disintegrating tablet Vitamin D , Ergocalciferol , (DRISDOL ) 1.25 MG (50000 UNIT) CAPS capsule  to [id number] - [place], [place] - [address] [address] they state they have not received it.

## 2024-03-26 NOTE — Telephone Encounter (Signed)
 Citalopram  and Olanzapine  already at the pharmacy. Is the patient supposed to be on the vitamin D  as well? If so, please send new RX.

## 2024-03-26 NOTE — Telephone Encounter (Signed)
 No prescription needed for Vit D.

## 2024-03-27 NOTE — Telephone Encounter (Signed)
 Contacted [facility] regarding medications. Citalopram  was received and Olanzapine  had to be ordered. Per pharmacy, Olanzapine  should be delivered after 2 pm today.   Called and LVM notifying patient of the above and that she didn't need a prescription for the vitamin D  anymore. Asked for patient to please call back with any questions or concerns.

## 2024-05-21 ENCOUNTER — Ambulatory Visit: Admitting: Nurse Practitioner

## 2024-05-21 ENCOUNTER — Encounter: Payer: Self-pay | Admitting: Nurse Practitioner

## 2024-05-21 VITALS — BP 129/85 | HR 69 | Temp 98.0°F | Ht 62.99 in | Wt 149.8 lb

## 2024-05-21 DIAGNOSIS — F333 Major depressive disorder, recurrent, severe with psychotic symptoms: Secondary | ICD-10-CM

## 2024-05-21 NOTE — Assessment & Plan Note (Signed)
 Chronic.  Controlled.  Continue with current medication regimen of Zyprexa  and Citalopram . Refills sent today.  Return to clinic in 5 months for reevaluation.  Call sooner if concerns arise.

## 2024-05-21 NOTE — Progress Notes (Signed)
 BP 129/85 (BP Location: Right Arm, Patient Position: Sitting, Cuff Size: Normal)   Pulse 69   Temp 98 F (36.7 C) (Oral)   Ht 5' 2.99 (1.6 m)   Wt 149 lb 12.8 oz (67.9 kg)   LMP 03/12/2024 (Approximate)   SpO2 97%   BMI 26.54 kg/m    Subjective:    Patient ID: Cristina Finley, female    DOB: [birth year], 48 y.o.   MRN: [id number]  HPI: Cristina Finley is a 48 y.o. female  Chief Complaint  Patient presents with   Depression   Anxiety    2 month F/u.   ANXIETY/DEPRESSION Patient states her mood has been better.  At last visit, Lexapro  was changed to Citalopram .  She is still taking the Zyprexa .  Her scores have increased. Feels like these are good doses for her. She does struggle with anxiety.  Denies SI.  Denies concerns at visit today.       05/21/2024   10:49 AM 03/18/2024   10:54 AM 09/17/2023   11:06 AM  PHQ9 SCORE ONLY  PHQ-9 Total Score 2 14 13       Data saved with a previous flowsheet row definition        05/21/2024   10:49 AM 03/18/2024   10:54 AM 09/17/2023   11:07 AM 03/21/2023    1:09 PM  GAD 7 : Generalized Anxiety Score  Nervous, Anxious, on Edge 1 1 2 1   Control/stop worrying 1 1 2 1   Worry too much - different things 1 1 2 1   Trouble relaxing 0 1 2 1   Restless 1 1 1 1   Easily annoyed or irritable 0 2 2 2   Afraid - awful might happen 0 1 1 0  Total GAD 7 Score 4 8 12 7   Anxiety Difficulty Not difficult at all        Relevant past medical, surgical, family and social history reviewed and updated as indicated. Interim medical history since our last visit reviewed. Allergies and medications reviewed and updated.  Review of Systems  Psychiatric/Behavioral:  Positive for dysphoric mood. Negative for suicidal ideas. The patient is nervous/anxious.     Per HPI unless specifically indicated above     Objective:    BP 129/85 (BP Location: Right Arm, Patient Position: Sitting, Cuff Size: Normal)   Pulse 69   Temp 98 F (36.7 C) (Oral)   Ht 5'  2.99 (1.6 m)   Wt 149 lb 12.8 oz (67.9 kg)   LMP 03/12/2024 (Approximate)   SpO2 97%   BMI 26.54 kg/m   Wt Readings from Last 3 Encounters:  05/21/24 149 lb 12.8 oz (67.9 kg)  03/18/24 155 lb (70.3 kg)  09/17/23 171 lb (77.6 kg)    Physical Exam Vitals and nursing note reviewed.  Constitutional:      General: She is not in acute distress.    Appearance: Normal appearance. She is normal weight. She is not ill-appearing, toxic-appearing or diaphoretic.  HENT:     Head: Normocephalic.     Right Ear: External ear normal.     Left Ear: External ear normal.     Nose: Nose normal.     Mouth/Throat:     Mouth: Mucous membranes are moist.     Pharynx: Oropharynx is clear.  Eyes:     General:        Right eye: No discharge.        Left eye: No discharge.     Extraocular Movements: Extraocular  movements intact.     Conjunctiva/sclera: Conjunctivae normal.     Pupils: Pupils are equal, round, and reactive to light.  Cardiovascular:     Rate and Rhythm: Normal rate and regular rhythm.     Heart sounds: No murmur heard. Pulmonary:     Effort: Pulmonary effort is normal. No respiratory distress.     Breath sounds: Normal breath sounds. No wheezing or rales.  Musculoskeletal:     Cervical back: Normal range of motion and neck supple.  Skin:    General: Skin is warm and dry.     Capillary Refill: Capillary refill takes less than 2 seconds.  Neurological:     General: No focal deficit present.     Mental Status: She is alert and oriented to person, place, and time. Mental status is at baseline.  Psychiatric:        Mood and Affect: Mood normal.        Behavior: Behavior normal.        Thought Content: Thought content normal.        Judgment: Judgment normal.     Results for orders placed or performed in visit on 03/18/24  CBC with Differential/Platelet   Collection Time: 03/18/24 11:16 AM  Result Value Ref Range   WBC 8.1 3.4 - 10.8 x10E3/uL   RBC 4.78 3.77 - 5.28 x10E6/uL    Hemoglobin 14.4 11.1 - 15.9 g/dL   Hematocrit 55.2 65.9 - 46.6 %   MCV 94 79 - 97 fL   MCH 30.1 26.6 - 33.0 pg   MCHC 32.2 31.5 - 35.7 g/dL   RDW 86.6 88.2 - 84.5 %   Platelets 264 150 - 450 x10E3/uL   Neutrophils 60 Not Estab. %   Lymphs 32 Not Estab. %   Monocytes 7 Not Estab. %   Eos 1 Not Estab. %   Basos 0 Not Estab. %   Neutrophils Absolute 4.8 1.4 - 7.0 x10E3/uL   Lymphocytes Absolute 2.6 0.7 - 3.1 x10E3/uL   Monocytes Absolute 0.5 0.1 - 0.9 x10E3/uL   EOS (ABSOLUTE) 0.1 0.0 - 0.4 x10E3/uL   Basophils Absolute 0.0 0.0 - 0.2 x10E3/uL   Immature Granulocytes 0 Not Estab. %   Immature Grans (Abs) 0.0 0.0 - 0.1 x10E3/uL  Comprehensive metabolic panel with GFR   Collection Time: 03/18/24 11:16 AM  Result Value Ref Range   Glucose 93 70 - 99 mg/dL   BUN 15 6 - 24 mg/dL   Creatinine, Ser 9.20 0.57 - 1.00 mg/dL   eGFR 91 >40 fO/fpw/8.26   BUN/Creatinine Ratio 19 9 - 23   Sodium 139 134 - 144 mmol/L   Potassium 4.6 3.5 - 5.2 mmol/L   Chloride 102 96 - 106 mmol/L   CO2 23 20 - 29 mmol/L   Calcium  9.7 8.7 - 10.2 mg/dL   Total Protein 7.0 6.0 - 8.5 g/dL   Albumin 4.5 3.9 - 4.9 g/dL   Globulin, Total 2.5 1.5 - 4.5 g/dL   Bilirubin Total <9.7 0.0 - 1.2 mg/dL   Alkaline Phosphatase 80 41 - 116 IU/L   AST 13 0 - 40 IU/L   ALT 10 0 - 32 IU/L  Lipid panel   Collection Time: 03/18/24 11:16 AM  Result Value Ref Range   Cholesterol, Total 215 (H) 100 - 199 mg/dL   Triglycerides 867 0 - 149 mg/dL   HDL 42 >60 mg/dL   VLDL Cholesterol Cal 24 5 - 40 mg/dL   LDL Chol Calc (  NIH) 149 (H) 0 - 99 mg/dL   Chol/HDL Ratio 5.1 (H) 0.0 - 4.4 ratio  TSH   Collection Time: 03/18/24 11:16 AM  Result Value Ref Range   TSH 2.810 0.450 - 4.500 uIU/mL      Assessment & Plan:   Problem List Items Addressed This Visit       Other   MDD (major depressive disorder), recurrent, severe, with psychosis (HCC) - Primary   Hypercholesteremia     Follow up plan: No follow-ups on file.

## 2024-07-01 ENCOUNTER — Other Ambulatory Visit (HOSPITAL_COMMUNITY): Payer: Self-pay

## 2024-07-01 ENCOUNTER — Telehealth: Payer: Self-pay

## 2024-07-01 NOTE — Telephone Encounter (Signed)
 Pharmacy Patient Advocate Encounter   Received notification from [facility] that prior authorization for OLANZapine  20MG  dispersible tablets is required/requested.   Insurance verification completed.   The patient is insured through [employer].   Per test claim: PA required; PA submitted to above mentioned insurance via Latent Key/confirmation #/EOC [id number] Status is pending

## 2024-07-02 ENCOUNTER — Other Ambulatory Visit (HOSPITAL_COMMUNITY): Payer: Self-pay

## 2024-07-02 NOTE — Telephone Encounter (Signed)
 Pharmacy Patient Advocate Encounter  Received notification from [facility] that Prior Authorization for OLANZapine  20MG  dispersible tablets  has been  APPROVED from 06/30/24 to 06/30/25. Ran test claim, Copay is $4.00. This test claim was processed through [facility]- copay amounts may vary at other pharmacies due to pharmacy/plan contracts, or as the patient moves through the different stages of their insurance plan.   PA #/Case ID/Reference #: [id number]

## 2024-07-07 ENCOUNTER — Other Ambulatory Visit (HOSPITAL_COMMUNITY): Payer: Self-pay

## 2024-10-21 ENCOUNTER — Ambulatory Visit: Admitting: Nurse Practitioner
